# Patient Record
Sex: Male | Born: 1960 | Hispanic: No | Marital: Single | Smoking: Former smoker
Health system: Southern US, Community
[De-identification: ages and names within clinical notes are randomized; demographics above are authoritative.]

## PROBLEM LIST (undated history)

## (undated) DIAGNOSIS — Z531 Procedure and treatment not carried out because of patient's decision for reasons of belief and group pressure: Secondary | ICD-10-CM

## (undated) DIAGNOSIS — M1990 Unspecified osteoarthritis, unspecified site: Secondary | ICD-10-CM

## (undated) DIAGNOSIS — K5790 Diverticulosis of intestine, part unspecified, without perforation or abscess without bleeding: Secondary | ICD-10-CM

## (undated) DIAGNOSIS — K635 Polyp of colon: Secondary | ICD-10-CM

## (undated) DIAGNOSIS — IMO0001 Reserved for inherently not codable concepts without codable children: Secondary | ICD-10-CM

## (undated) DIAGNOSIS — R7303 Prediabetes: Secondary | ICD-10-CM

## (undated) HISTORY — DX: Unspecified osteoarthritis, unspecified site: M19.90

## (undated) HISTORY — PX: KNEE SURGERY: SHX244

## (undated) HISTORY — DX: Polyp of colon: K63.5

## (undated) HISTORY — PX: HERNIA REPAIR: SHX51

## (undated) HISTORY — PX: TOOTH EXTRACTION: SUR596

## (undated) HISTORY — PX: COLONOSCOPY: SHX174

## (undated) HISTORY — DX: Procedure and treatment not carried out because of patient's decision for reasons of belief and group pressure: Z53.1

## (undated) HISTORY — DX: Diverticulosis of intestine, part unspecified, without perforation or abscess without bleeding: K57.90

## (undated) HISTORY — DX: Reserved for inherently not codable concepts without codable children: IMO0001

---

## 1997-12-24 ENCOUNTER — Emergency Department (HOSPITAL_COMMUNITY): Admission: EM | Admit: 1997-12-24 | Discharge: 1997-12-24 | Payer: Self-pay | Admitting: Emergency Medicine

## 1997-12-26 ENCOUNTER — Emergency Department (HOSPITAL_COMMUNITY): Admission: EM | Admit: 1997-12-26 | Discharge: 1997-12-26 | Payer: Self-pay | Admitting: Internal Medicine

## 1997-12-30 ENCOUNTER — Encounter: Payer: Self-pay | Admitting: Emergency Medicine

## 1997-12-30 ENCOUNTER — Emergency Department (HOSPITAL_COMMUNITY): Admission: EM | Admit: 1997-12-30 | Discharge: 1997-12-30 | Payer: Self-pay | Admitting: Emergency Medicine

## 1998-01-06 ENCOUNTER — Emergency Department (HOSPITAL_COMMUNITY): Admission: EM | Admit: 1998-01-06 | Discharge: 1998-01-06 | Payer: Self-pay | Admitting: Family Medicine

## 1998-02-25 ENCOUNTER — Encounter (HOSPITAL_COMMUNITY): Admission: RE | Admit: 1998-02-25 | Discharge: 1998-05-26 | Payer: Self-pay | Admitting: Psychiatry

## 1998-02-26 ENCOUNTER — Ambulatory Visit (HOSPITAL_COMMUNITY): Admission: RE | Admit: 1998-02-26 | Discharge: 1998-02-26 | Payer: Self-pay | Admitting: Gastroenterology

## 1998-03-28 ENCOUNTER — Encounter: Payer: Self-pay | Admitting: Emergency Medicine

## 1998-03-28 ENCOUNTER — Emergency Department (HOSPITAL_COMMUNITY): Admission: EM | Admit: 1998-03-28 | Discharge: 1998-03-28 | Payer: Self-pay

## 1998-06-27 ENCOUNTER — Ambulatory Visit (HOSPITAL_COMMUNITY): Admission: RE | Admit: 1998-06-27 | Discharge: 1998-06-27 | Payer: Self-pay | Admitting: Gastroenterology

## 1998-11-22 ENCOUNTER — Other Ambulatory Visit (HOSPITAL_COMMUNITY): Admission: RE | Admit: 1998-11-22 | Discharge: 1998-12-13 | Payer: Self-pay | Admitting: Psychiatry

## 1998-12-02 ENCOUNTER — Emergency Department (HOSPITAL_COMMUNITY): Admission: EM | Admit: 1998-12-02 | Discharge: 1998-12-02 | Payer: Self-pay | Admitting: Emergency Medicine

## 1998-12-25 ENCOUNTER — Encounter: Admission: RE | Admit: 1998-12-25 | Discharge: 1999-02-27 | Payer: Self-pay

## 2000-05-17 ENCOUNTER — Other Ambulatory Visit (HOSPITAL_COMMUNITY): Admission: RE | Admit: 2000-05-17 | Discharge: 2000-05-18 | Payer: Self-pay

## 2000-05-18 ENCOUNTER — Emergency Department (HOSPITAL_COMMUNITY): Admission: EM | Admit: 2000-05-18 | Discharge: 2000-05-18 | Payer: Self-pay | Admitting: Emergency Medicine

## 2000-05-18 ENCOUNTER — Encounter: Payer: Self-pay | Admitting: Emergency Medicine

## 2000-10-11 ENCOUNTER — Emergency Department (HOSPITAL_COMMUNITY): Admission: EM | Admit: 2000-10-11 | Discharge: 2000-10-11 | Payer: Self-pay | Admitting: Emergency Medicine

## 2000-10-11 ENCOUNTER — Encounter: Payer: Self-pay | Admitting: Emergency Medicine

## 2000-10-24 ENCOUNTER — Emergency Department (HOSPITAL_COMMUNITY): Admission: EM | Admit: 2000-10-24 | Discharge: 2000-10-24 | Payer: Self-pay | Admitting: Emergency Medicine

## 2000-12-03 ENCOUNTER — Other Ambulatory Visit (HOSPITAL_COMMUNITY): Admission: RE | Admit: 2000-12-03 | Discharge: 2000-12-20 | Payer: Self-pay | Admitting: Psychiatry

## 2008-07-02 DIAGNOSIS — J309 Allergic rhinitis, unspecified: Secondary | ICD-10-CM | POA: Insufficient documentation

## 2012-06-06 DIAGNOSIS — R911 Solitary pulmonary nodule: Secondary | ICD-10-CM | POA: Insufficient documentation

## 2012-06-07 DIAGNOSIS — E785 Hyperlipidemia, unspecified: Secondary | ICD-10-CM | POA: Insufficient documentation

## 2012-08-19 DIAGNOSIS — N529 Male erectile dysfunction, unspecified: Secondary | ICD-10-CM | POA: Insufficient documentation

## 2012-10-06 LAB — HM COLONOSCOPY

## 2014-01-14 HISTORY — PX: MENISCUS REPAIR: SHX5179

## 2017-01-26 ENCOUNTER — Telehealth: Payer: Self-pay | Admitting: Internal Medicine

## 2017-01-26 ENCOUNTER — Encounter: Payer: Self-pay | Admitting: Internal Medicine

## 2017-01-26 ENCOUNTER — Ambulatory Visit (INDEPENDENT_AMBULATORY_CARE_PROVIDER_SITE_OTHER): Payer: PRIVATE HEALTH INSURANCE | Admitting: Internal Medicine

## 2017-01-26 VITALS — BP 118/70 | HR 61 | Temp 98.1°F | Ht 70.5 in | Wt 181.2 lb

## 2017-01-26 DIAGNOSIS — N4889 Other specified disorders of penis: Secondary | ICD-10-CM

## 2017-01-26 DIAGNOSIS — M25562 Pain in left knee: Secondary | ICD-10-CM

## 2017-01-26 DIAGNOSIS — N529 Male erectile dysfunction, unspecified: Secondary | ICD-10-CM

## 2017-01-26 MED ORDER — TADALAFIL 20 MG PO TABS: 10.0000 mg | ORAL_TABLET | ORAL | 5 refills | Status: DC | PRN

## 2017-01-26 MED ORDER — NAPROXEN 375 MG PO TABS: 375.0000 mg | ORAL_TABLET | Freq: Two times a day (BID) | ORAL | 0 refills | Status: DC

## 2017-01-26 NOTE — Patient Instructions (Signed)

## 2017-01-26 NOTE — Progress Notes (Signed)
HPI  Pt presents to the clinic today to establish care. He is transferring care from his PCP in [place], [place], [facility].  He c/o left knee pain.This started 2 seeks ago. He describes the pain as sharp and stabbing. He reports his knee locked up on him where he couldn't bend it. He does have some tingling but denies numbness or weakness. He has not taken anything OTC for this. He had a torn meniscus repair in Feb 2016.  He also c/o a cyst at the base of his penis. He noticed this 2 weeks. He reports it does not hurt, itch or burn. He has not tried anything OTC for it.  He also reports difficulty maintaining an erection. This has been going on for years. He has no issues ejaculating. He has tried Cialis in the past and would like a RX for this today.   Flu: never Tetanus: 2-3 years ago PSA Screening: 2 years ago Colon Screening: 2013, every 3 years, polyps Vision Screening: as needed Dentist: as needed  History reviewed. No pertinent past medical history.  No current outpatient medications on file.   No current facility-administered medications for this visit.     No Known Allergies  Family History  Problem Relation Age of Onset  . Heart disease Father   . Heart attack Father   . Hypertension Sister   . Hyperlipidemia Sister   . Asthma Sister   . Hypertension Brother   . Hyperlipidemia Brother   . Hypertension Maternal Aunt   . Hypertension Maternal Uncle   . Hypertension Paternal Aunt   . Hypertension Paternal Uncle   . Hypertension Maternal Grandmother     Social History   Socioeconomic History  . Marital status: Single    Spouse name: Not on file  . Number of children: Not on file  . Years of education: Not on file  . Highest education level: Not on file  Social Needs  . Financial resource strain: Not on file  . Food insecurity - worry: Not on file  . Food insecurity - inability: Not on file  . Transportation needs - medical: Not on file  . Transportation needs -  non-medical: Not on file  Occupational History  . Not on file  Tobacco Use  . Smoking status: Former Research scientist (life sciences)  . Smokeless tobacco: Never Used  Substance and Sexual Activity  . Alcohol use: Yes  . Drug use: No  . Sexual activity: Yes    Birth control/protection: Condom  Other Topics Concern  . Not on file  Social History Narrative  . Not on file    ROS:  Constitutional: Denies fever, malaise, fatigue, headache or abrupt weight changes.  HEENT: Denies eye pain, eye redness, ear pain, ringing in the ears, wax buildup, runny nose, nasal congestion, bloody nose, or sore throat. Respiratory: Denies difficulty breathing, shortness of breath, cough or sputum production.   Cardiovascular: Denies chest pain, chest tightness, palpitations or swelling in the hands or feet.  Gastrointestinal: Denies abdominal pain, bloating, constipation, diarrhea or blood in the stool.  GU: Pt reports erectile dysfunction. Denies frequency, urgency, pain with urination, blood in urine, odor or discharge. Musculoskeletal: Pt reports knee pain. Denies decrease in range of motion, difficulty with gait, muscle pain or joint swelling.  Skin: Pt reports lesion on base of penis. Denies ulcercations.  Neurological: Denies dizziness, difficulty with memory, difficulty with speech or problems with balance and coordination.  Psych: Denies anxiety, depression, SI/HI.  No other specific complaints in  a complete review of systems (except as listed in HPI above).  PE:  BP 118/70   Pulse 61   Temp 98.1 F (36.7 C) (Oral)   Ht 5' 10.5" (1.791 m)   Wt 181 lb 4 oz (82.2 kg)   SpO2 97%   BMI 25.64 kg/m  Wt Readings from Last 3 Encounters:  01/26/17 181 lb 4 oz (82.2 kg)    General: Appears his stated age, well developed, well nourished in NAD. Cardiovascular: Normal rate and rhythm. S1,S2 noted.  No murmur, rubs or gallops noted. Pulmonary/Chest: Normal effort and positive vesicular breath sounds. No respiratory  distress. No wheezes, rales or ronchi noted.  Abdomen: Soft and nontender. Normal bowel sounds GU: 1 cm sebaceous cyst noted at base of penis. MSK: Normal flexion and extension of the knee. No joint swelling noted. Pain with palpation over the medial meniscus. No difficulty with gait. Neurological: Alert and oriented. Cranial nerves II-XII grossly intact. Coordination normal.  Psychiatric: Mood and affect normal. Behavior is normal. Judgment and thought content normal.    Assessment and Plan:  Sebaceous Cyst of Penis:  No intervention needed  Left Knee Pain:  eRx for Naproxen 375 mg BID Knee exercises given He declines PT at this time I do not think an xray is warranted at this time May need MRI in the future  ED:  eRx for Cialis 10-20 mg daily prn  Return precautions discussed. Make an appt for your annual exam. Webb Silversmith, NP

## 2017-01-27 ENCOUNTER — Telehealth: Payer: Self-pay

## 2017-01-27 NOTE — Telephone Encounter (Signed)
Pt reports this medication is too expensive... I did give pt coupon info on Viagra and he is wanting to see if this is more affordable.. The coupon says for Brand name.... Please advise

## 2017-01-27 NOTE — Telephone Encounter (Signed)
Copied from [place] #[id number]. Topic: Inquiry >> Jan 27, 2017  2:48 PM Marcus Andrews wrote: Reason for CRM: Patient called wanting to switch medications. Patient now wants the generic brand for Cialis. Please call patient ASAP.Marcus KitchenMarland Andrews

## 2017-01-28 MED ORDER — SILDENAFIL CITRATE 50 MG PO TABS: 25.0000 mg | ORAL_TABLET | Freq: Every day | ORAL | 0 refills | Status: DC | PRN

## 2017-01-28 NOTE — Addendum Note (Signed)
Addended by: Jearld Fenton on: 01/28/2017 11:26 AM   Modules accepted: Orders

## 2017-01-28 NOTE — Telephone Encounter (Signed)
Brand name Viagra sent to [facility]

## 2017-02-15 ENCOUNTER — Encounter: Payer: Self-pay | Admitting: Internal Medicine

## 2017-02-15 ENCOUNTER — Ambulatory Visit (INDEPENDENT_AMBULATORY_CARE_PROVIDER_SITE_OTHER): Payer: PRIVATE HEALTH INSURANCE | Admitting: Internal Medicine

## 2017-02-15 VITALS — BP 118/74 | HR 64 | Temp 98.7°F | Ht 70.5 in | Wt 184.5 lb

## 2017-02-15 DIAGNOSIS — H6123 Impacted cerumen, bilateral: Secondary | ICD-10-CM | POA: Diagnosis not present

## 2017-02-15 DIAGNOSIS — Z1159 Encounter for screening for other viral diseases: Secondary | ICD-10-CM

## 2017-02-15 DIAGNOSIS — Z13220 Encounter for screening for lipoid disorders: Secondary | ICD-10-CM | POA: Diagnosis not present

## 2017-02-15 DIAGNOSIS — Z1211 Encounter for screening for malignant neoplasm of colon: Secondary | ICD-10-CM

## 2017-02-15 DIAGNOSIS — R232 Flushing: Secondary | ICD-10-CM | POA: Diagnosis not present

## 2017-02-15 DIAGNOSIS — M25562 Pain in left knee: Secondary | ICD-10-CM

## 2017-02-15 DIAGNOSIS — N529 Male erectile dysfunction, unspecified: Secondary | ICD-10-CM

## 2017-02-15 DIAGNOSIS — Z125 Encounter for screening for malignant neoplasm of prostate: Secondary | ICD-10-CM | POA: Diagnosis not present

## 2017-02-15 DIAGNOSIS — Z114 Encounter for screening for human immunodeficiency virus [HIV]: Secondary | ICD-10-CM | POA: Diagnosis not present

## 2017-02-15 DIAGNOSIS — Z0000 Encounter for general adult medical examination without abnormal findings: Secondary | ICD-10-CM

## 2017-02-15 DIAGNOSIS — Z113 Encounter for screening for infections with a predominantly sexual mode of transmission: Secondary | ICD-10-CM | POA: Diagnosis not present

## 2017-02-15 LAB — CBC
HCT: 43.1 % (ref 39.0–52.0)
Hemoglobin: 13.9 g/dL (ref 13.0–17.0)
MCHC: 32.1 g/dL (ref 30.0–36.0)
MCV: 86.2 fl (ref 78.0–100.0)
Platelets: 227 10*3/uL (ref 150.0–400.0)
RBC: 5.01 Mil/uL (ref 4.22–5.81)
RDW: 15.7 % — ABNORMAL HIGH (ref 11.5–15.5)
WBC: 9.5 10*3/uL (ref 4.0–10.5)

## 2017-02-15 LAB — PSA: PSA: 0.86 ng/mL (ref 0.10–4.00)

## 2017-02-15 LAB — TSH: TSH: 0.9 u[IU]/mL (ref 0.35–4.50)

## 2017-02-15 MED ORDER — SILDENAFIL CITRATE 20 MG PO TABS: ORAL_TABLET | ORAL | 0 refills | Status: DC

## 2017-02-15 NOTE — Patient Instructions (Signed)

## 2017-02-15 NOTE — Progress Notes (Signed)
Subjective:    Patient ID: Marcus Andrews, male    DOB: [birth year], 56 y.o.   MRN: [id number]  HPI  Pt presents to the clinic today for his annual exam.  He reports the generic Sildenafil was too expensive at [facility]. He would like to have Revatio send to [facility].  He also reports no improvement in his knee pain. He has not started taking the Naproxen as prescribed. He has been doing knee exercises at home and using ice. He would like an xray of the left knee today.  He also reports he has been having hot flashes. This has been occurring on a daily basis for at least 2 years. He is not sure what triggers them. They can occur day or night. He has not tried anything OTC for his symptoms.   Flu: never Tetanus: 2-3 years ago PSA Screening: 2 years ago Colon Screening: 2013, every 3 years, polyps Vision Screening: as needed Dentist: as needed  Diet: He does eat meat. He consumes some fruits, more veggies. He does eat fried foods. He drinks mostly water, some soda, some coffee. Exercise: None  Review of Systems  No past medical history on file.  Current Outpatient Medications  Medication Sig Dispense Refill  . naproxen (NAPROSYN) 375 MG tablet Take 1 tablet (375 mg total) 2 (two) times daily with a meal by mouth. 60 tablet 0  . sildenafil (VIAGRA) 50 MG tablet Take 0.5 tablets (25 mg total) daily as needed by mouth for erectile dysfunction. 10 tablet 0   No current facility-administered medications for this visit.     No Known Allergies  Family History  Problem Relation Age of Onset  . Heart disease Father   . Heart attack Father   . Hypertension Sister   . Hyperlipidemia Sister   . Asthma Sister   . Hypertension Brother   . Hyperlipidemia Brother   . Hypertension Maternal Aunt   . Hypertension Maternal Uncle   . Hypertension Paternal Aunt   . Hypertension Paternal Uncle   . Hypertension Maternal Grandmother     Social History   Socioeconomic History  . Marital  status: Single    Spouse name: Not on file  . Number of children: Not on file  . Years of education: Not on file  . Highest education level: Not on file  Social Needs  . Financial resource strain: Not on file  . Food insecurity - worry: Not on file  . Food insecurity - inability: Not on file  . Transportation needs - medical: Not on file  . Transportation needs - non-medical: Not on file  Occupational History  . Not on file  Tobacco Use  . Smoking status: Former Research scientist (life sciences)  . Smokeless tobacco: Never Used  Substance and Sexual Activity  . Alcohol use: Yes  . Drug use: No  . Sexual activity: Yes    Birth control/protection: Condom  Other Topics Concern  . Not on file  Social History Narrative  . Not on file     Constitutional: Denies fever, malaise, fatigue, headache or abrupt weight changes.  HEENT: Denies eye pain, eye redness, ear pain, ringing in the ears, wax buildup, runny nose, nasal congestion, bloody nose, or sore throat. Respiratory: Denies difficulty breathing, shortness of breath, cough or sputum production.   Cardiovascular: Denies chest pain, chest tightness, palpitations or swelling in the hands or feet.  Gastrointestinal: Denies abdominal pain, bloating, constipation, diarrhea or blood in the stool.  GU: Pt reports erectile dysfunction.  Denies urgency, frequency, pain with urination, burning sensation, blood in urine, odor or discharge. Musculoskeletal: Pt reports left knee pain. Denies decrease in range of motion, difficulty with gait, muscle pain or joint swelling.  Skin: Denies redness, rashes, lesions or ulcercations.  Neurological: Denies dizziness, difficulty with memory, difficulty with speech or problems with balance and coordination.  Psych: Denies anxiety, depression, SI/HI.  No other specific complaints in a complete review of systems (except as listed in HPI above).     Objective:   Physical Exam   BP 118/74   Pulse 64   Temp 98.7 F (37.1 C)  (Oral)   Ht 5' 10.5" (1.791 m)   Wt 184 lb 8 oz (83.7 kg)   SpO2 97%   BMI 26.10 kg/m  Wt Readings from Last 3 Encounters:  02/15/17 184 lb 8 oz (83.7 kg)  01/26/17 181 lb 4 oz (82.2 kg)    General: Appears his stated age, well developed, well nourished in NAD. Skin: Warm, dry and intact.  HEENT: Head: normal shape and size; Eyes: sclera white, no icterus, conjunctiva pink, PERRLA and EOMs intact; Ears: bilateral cerumen impaction; Throat/Mouth: Teeth present, mucosa pink and moist, no exudate, lesions or ulcerations noted.  Neck:  Neck supple, trachea midline. No masses, lumps or thyromegaly present.  Cardiovascular: Normal rate and rhythm. S1,S2 noted.  No murmur, rubs or gallops noted. No JVD or BLE edema. No carotid bruits noted. Pulmonary/Chest: Normal effort and positive vesicular breath sounds. No respiratory distress. No wheezes, rales or ronchi noted.  Abdomen: Soft and nontender. Normal bowel sounds. No distention or masses noted. Liver, spleen and kidneys non palpable. Musculoskeletal: Normal flexion and extension of the left knee. No swelling noted. Pain with palpation over the medial meniscus. Strength 5/5 BUE/BLE. No difficulty with gait.  Neurological: Alert and oriented. Cranial nerves II-XII grossly intact. Coordination normal.  Psychiatric: Mood and affect normal. Behavior is normal. Judgment and thought content normal.           Assessment & Plan:   Preventative Health Maintenance:  He declines flu shot Tetanus UTD per his report Referral placed to GI for screening colonoscopy Encouraged him to consume a balanced diet and exercise regimen Advised him to see an eye doctor and dentist annually Will check CBC, CMET, Lipid, TSH, PSA, HIV, Hep C, HSV and RPR today He will return for urine gonorrhea and chlamydia  Left Knee Pain:  Continue exercises and ice Xray of left knee today May need PT vs MRI  Bilateral Cerumen Impaction:  Manual lavage by  CMA Advised him to try Debrox OTC 2 x week to prevent wax buildup  ED:  Will send Revatio to [facility] Flashes:  Will check TSH today  RTC in 1 year, sooner if needed

## 2017-02-15 NOTE — Addendum Note (Signed)
Addended by: Ellamae Sia on: 02/15/2017 01:03 PM   Modules accepted: Orders

## 2017-02-16 LAB — COMPREHENSIVE METABOLIC PANEL
ALT: 14 U/L (ref 0–53)
AST: 17 U/L (ref 0–37)
Albumin: 4.2 g/dL (ref 3.5–5.2)
Alkaline Phosphatase: 69 U/L (ref 39–117)
BUN: 14 mg/dL (ref 6–23)
CO2: 28 mEq/L (ref 19–32)
Calcium: 9.5 mg/dL (ref 8.4–10.5)
Chloride: 105 mEq/L (ref 96–112)
Creatinine, Ser: 0.99 mg/dL (ref 0.40–1.50)
GFR: 100.91 mL/min (ref >60.00)
Glucose, Bld: 82 mg/dL (ref 70–99)
Potassium: 4.2 mEq/L (ref 3.5–5.1)
Sodium: 142 mEq/L (ref 135–145)
Total Bilirubin: 0.6 mg/dL (ref 0.2–1.2)
Total Protein: 6.5 g/dL (ref 6.0–8.3)

## 2017-02-16 LAB — LIPID PANEL
Cholesterol: 215 mg/dL — ABNORMAL HIGH (ref 0–200)
HDL: 68.4 mg/dL (ref >39.00)
LDL Cholesterol: 133 mg/dL — ABNORMAL HIGH (ref 0–99)
NonHDL: 146.21
Total CHOL/HDL Ratio: 3
Triglycerides: 68 mg/dL (ref 0.0–149.0)
VLDL: 13.6 mg/dL (ref 0.0–40.0)

## 2017-02-17 LAB — RPR: RPR: NONREACTIVE

## 2017-02-17 LAB — HSV 1/2 AB (IGM), IFA W/RFLX TITER
HSV 1 IgM Screen: NEGATIVE
HSV 2 IgM Screen: NEGATIVE

## 2017-02-17 LAB — HSV(HERPES SIMPLEX VRS) I + II AB-IGG
HAV 1 IGG,TYPE SPECIFIC AB: 2.32 index — ABNORMAL HIGH
HSV 2 IGG,TYPE SPECIFIC AB: 15.6 index — ABNORMAL HIGH

## 2017-02-17 LAB — HEPATITIS C ANTIBODY
HEP C AB: NONREACTIVE
SIGNAL TO CUT-OFF: 0.02 (ref <1.00)

## 2017-02-17 LAB — HIV ANTIBODY (ROUTINE TESTING W REFLEX): HIV: NONREACTIVE

## 2017-02-19 ENCOUNTER — Ambulatory Visit (INDEPENDENT_AMBULATORY_CARE_PROVIDER_SITE_OTHER)
Admission: RE | Admit: 2017-02-19 | Discharge: 2017-02-19 | Disposition: A | Discharge status: Home / Self Care | Payer: PRIVATE HEALTH INSURANCE | Source: Ambulatory Visit | Attending: Internal Medicine | Admitting: Internal Medicine

## 2017-02-19 ENCOUNTER — Other Ambulatory Visit: Payer: PRIVATE HEALTH INSURANCE

## 2017-02-19 DIAGNOSIS — M25562 Pain in left knee: Secondary | ICD-10-CM | POA: Diagnosis not present

## 2017-02-19 NOTE — Addendum Note (Signed)
Addended by: Ellamae Sia on: 02/19/2017 02:56 PM   Modules accepted: Orders

## 2017-02-19 NOTE — Addendum Note (Signed)
Addended by: Lurlean Nanny on: 02/19/2017 02:25 PM   Modules accepted: Orders

## 2017-02-20 LAB — C. TRACHOMATIS/N. GONORRHOEAE RNA
C. trachomatis RNA, TMA: NOT DETECTED
N. gonorrhoeae RNA, TMA: NOT DETECTED

## 2017-03-02 ENCOUNTER — Other Ambulatory Visit: Payer: Self-pay | Admitting: Internal Medicine

## 2017-03-02 DIAGNOSIS — M25562 Pain in left knee: Secondary | ICD-10-CM

## 2017-03-05 ENCOUNTER — Telehealth: Payer: Self-pay | Admitting: Internal Medicine

## 2017-03-05 NOTE — Telephone Encounter (Signed)
rx request [facility] pt in [facility] box

## 2017-03-08 ENCOUNTER — Other Ambulatory Visit: Payer: Self-pay | Admitting: Internal Medicine

## 2017-03-08 NOTE — Telephone Encounter (Signed)
I can not find this form

## 2017-03-24 ENCOUNTER — Ambulatory Visit: Payer: PRIVATE HEALTH INSURANCE

## 2017-06-08 ENCOUNTER — Telehealth: Payer: Self-pay | Admitting: Gastroenterology

## 2017-06-08 ENCOUNTER — Encounter: Payer: Self-pay | Admitting: Internal Medicine

## 2017-06-08 NOTE — Telephone Encounter (Signed)
Dr. Fuller Plan reviewed records and recommended "5 year recall colon due to prior hx of polyps and Fhx colon cancer. Pt last Andrena Mews was in July 2014, due July 2019."

## 2017-08-04 ENCOUNTER — Encounter: Payer: Self-pay | Admitting: Internal Medicine

## 2017-09-28 NOTE — Telephone Encounter (Signed)
Patient has not returned phone call to schedule. Records will be in "records reviewed" folder. °

## 2017-11-26 ENCOUNTER — Other Ambulatory Visit: Payer: Self-pay | Admitting: Internal Medicine

## 2017-11-26 DIAGNOSIS — N529 Male erectile dysfunction, unspecified: Secondary | ICD-10-CM

## 2017-11-26 NOTE — Telephone Encounter (Signed)
Name of Medication: sildenafil (Revatio) 20 mg Name of Pharmacy: [facility] Date and Quantity: #50 on 02/15/17 Last Office Visit and Type: 02/15/17 annual Next Office Visit and Type: none  per note pt wants to pick up rx.

## 2017-11-30 MED ORDER — SILDENAFIL CITRATE 20 MG PO TABS: ORAL_TABLET | ORAL | 0 refills | Status: DC

## 2017-11-30 NOTE — Telephone Encounter (Signed)
RX printed and signed and placed in [facility] box 

## 2018-06-30 ENCOUNTER — Emergency Department
Admission: EM | Admit: 2018-06-30 | Discharge: 2018-06-30 | Disposition: A | Discharge status: Home / Self Care | Payer: BLUE CROSS/BLUE SHIELD | Attending: Emergency Medicine | Admitting: Emergency Medicine

## 2018-06-30 ENCOUNTER — Emergency Department: Payer: BLUE CROSS/BLUE SHIELD

## 2018-06-30 ENCOUNTER — Encounter: Payer: Self-pay | Admitting: Emergency Medicine

## 2018-06-30 ENCOUNTER — Other Ambulatory Visit: Payer: Self-pay

## 2018-06-30 DIAGNOSIS — Z79899 Other long term (current) drug therapy: Secondary | ICD-10-CM | POA: Insufficient documentation

## 2018-06-30 DIAGNOSIS — J069 Acute upper respiratory infection, unspecified: Secondary | ICD-10-CM | POA: Diagnosis not present

## 2018-06-30 DIAGNOSIS — Z87891 Personal history of nicotine dependence: Secondary | ICD-10-CM | POA: Insufficient documentation

## 2018-06-30 DIAGNOSIS — R0602 Shortness of breath: Secondary | ICD-10-CM | POA: Diagnosis present

## 2018-06-30 DIAGNOSIS — Z20828 Contact with and (suspected) exposure to other viral communicable diseases: Secondary | ICD-10-CM | POA: Diagnosis not present

## 2018-06-30 DIAGNOSIS — Z20822 Contact with and (suspected) exposure to covid-19: Secondary | ICD-10-CM

## 2018-06-30 DIAGNOSIS — R6889 Other general symptoms and signs: Secondary | ICD-10-CM

## 2018-06-30 LAB — CBC
HCT: 45.5 % (ref 39.0–52.0)
Hemoglobin: 14.4 g/dL (ref 13.0–17.0)
MCH: 27.3 pg (ref 26.0–34.0)
MCHC: 31.6 g/dL (ref 30.0–36.0)
MCV: 86.2 fL (ref 80.0–100.0)
Platelets: 236 10*3/uL (ref 150–400)
RBC: 5.28 MIL/uL (ref 4.22–5.81)
RDW: 15.9 % — ABNORMAL HIGH (ref 11.5–15.5)
WBC: 7.1 10*3/uL (ref 4.0–10.5)
nRBC: 0 % (ref 0.0–0.2)

## 2018-06-30 LAB — COMPREHENSIVE METABOLIC PANEL
ALT: 21 U/L (ref 0–44)
AST: 23 U/L (ref 15–41)
Albumin: 4.1 g/dL (ref 3.5–5.0)
Alkaline Phosphatase: 74 U/L (ref 38–126)
Anion gap: 7 (ref 5–15)
BUN: 14 mg/dL (ref 6–20)
CO2: 28 mmol/L (ref 22–32)
Calcium: 9.3 mg/dL (ref 8.9–10.3)
Chloride: 107 mmol/L (ref 98–111)
Creatinine, Ser: 1 mg/dL (ref 0.61–1.24)
GFR calc Af Amer: >60 mL/min (ref >60)
GFR calc non Af Amer: >60 mL/min (ref >60)
Glucose, Bld: 114 mg/dL — ABNORMAL HIGH (ref 70–99)
Potassium: 3.6 mmol/L (ref 3.5–5.1)
Sodium: 142 mmol/L (ref 135–145)
Total Bilirubin: 0.9 mg/dL (ref 0.3–1.2)
Total Protein: 7.9 g/dL (ref 6.5–8.1)

## 2018-06-30 LAB — TROPONIN I: Troponin I: <0.03 ng/mL (ref <0.03)

## 2018-06-30 NOTE — Discharge Instructions (Signed)
Person Under Monitoring Name: Marcus Andrews  Location: [address] [place] [address] [place] [address]   Infection Prevention Recommendations for Individuals Confirmed to have, or Being Evaluated for, 2019 Novel Coronavirus (COVID-19) Infection Who Receive Care at Home  Individuals who are confirmed to have, or are being evaluated for, COVID-19 should follow the prevention steps below until a healthcare provider or local or state health department says they can return to normal activities.  Stay home except to get medical care You should restrict activities outside your home, except for getting medical care. Do not go to work, school, or public areas, and do not use public transportation or taxis.  Call ahead before visiting your doctor Before your medical appointment, call the healthcare provider and tell them that you have, or are being evaluated for, COVID-19 infection. This will help the healthcare providers office take steps to keep other people from getting infected. Ask your healthcare provider to call the local or state health department.  Monitor your symptoms Seek prompt medical attention if your illness is worsening (e.g., difficulty breathing). Before going to your medical appointment, call the healthcare provider and tell them that you have, or are being evaluated for, COVID-19 infection. Ask your healthcare provider to call the local or state health department.  Wear a facemask You should wear a facemask that covers your nose and mouth when you are in the same room with other people and when you visit a healthcare provider. People who live with or visit you should also wear a facemask while they are in the same room with you.  Separate yourself from other people in your home As much as possible, you should stay in a different room from other people in your home. Also, you should use a separate bathroom, if available.  Avoid sharing household items You  should not share dishes, drinking glasses, cups, eating utensils, towels, bedding, or other items with other people in your home. After using these items, you should wash them thoroughly with soap and water.  Cover your coughs and sneezes Cover your mouth and nose with a tissue when you cough or sneeze, or you can cough or sneeze into your sleeve. Throw used tissues in a lined trash can, and immediately wash your hands with soap and water for at least 20 seconds or use an alcohol-based hand rub.  Wash your [facility] your hands often and thoroughly with soap and water for at least 20 seconds. You can use an alcohol-based hand sanitizer if soap and water are not available and if your hands are not visibly dirty. Avoid touching your eyes, nose, and mouth with unwashed hands.   Prevention Steps for Caregivers and Household Members of Individuals Confirmed to have, or Being Evaluated for, COVID-19 Infection Being Cared for in the Home  If you live with, or provide care at home for, a person confirmed to have, or being evaluated for, COVID-19 infection please follow these guidelines to prevent infection:  Follow healthcare providers instructions Make sure that you understand and can help the patient follow any healthcare provider instructions for all care.  Provide for the patients basic needs You should help the patient with basic needs in the home and provide support for getting groceries, prescriptions, and other personal needs.  Monitor the patients symptoms If they are getting sicker, call his or her medical provider and tell them that the patient has, or is being evaluated for, COVID-19 infection. This will help the  healthcare providers office take steps to keep other people from getting infected. Ask the healthcare provider to call the local or state health department.  Limit the number of people who have contact with the patient If possible, have only one caregiver for the  patient. Other household members should stay in another home or place of residence. If this is not possible, they should stay in another room, or be separated from the patient as much as possible. Use a separate bathroom, if available. Restrict visitors who do not have an essential need to be in the home.  Keep older adults, very young children, and other sick people away from the patient Keep older adults, very young children, and those who have compromised immune systems or chronic health conditions away from the patient. This includes people with chronic heart, lung, or kidney conditions, diabetes, and cancer.  Ensure good ventilation Make sure that shared spaces in the home have good air flow, such as from an air conditioner or an opened window, weather permitting.  Wash your hands often Wash your hands often and thoroughly with soap and water for at least 20 seconds. You can use an alcohol based hand sanitizer if soap and water are not available and if your hands are not visibly dirty. Avoid touching your eyes, nose, and mouth with unwashed hands. Use disposable paper towels to dry your hands. If not available, use dedicated cloth towels and replace them when they become wet.  Wear a facemask and gloves Wear a disposable facemask at all times in the room and gloves when you touch or have contact with the patients blood, body fluids, and/or secretions or excretions, such as sweat, saliva, sputum, nasal mucus, vomit, urine, or feces.  Ensure the mask fits over your nose and mouth tightly, and do not touch it during use. Throw out disposable facemasks and gloves after using them. Do not reuse. Wash your hands immediately after removing your facemask and gloves. If your personal clothing becomes contaminated, carefully remove clothing and launder. Wash your hands after handling contaminated clothing. Place all used disposable facemasks, gloves, and other waste in a lined container before  disposing them with other household waste. Remove gloves and wash your hands immediately after handling these items.  Do not share dishes, glasses, or other household items with the patient Avoid sharing household items. You should not share dishes, drinking glasses, cups, eating utensils, towels, bedding, or other items with a patient who is confirmed to have, or being evaluated for, COVID-19 infection. After the person uses these items, you should wash them thoroughly with soap and water.  Wash laundry thoroughly Immediately remove and wash clothes or bedding that have blood, body fluids, and/or secretions or excretions, such as sweat, saliva, sputum, nasal mucus, vomit, urine, or feces, on them. Wear gloves when handling laundry from the patient. Read and follow directions on labels of laundry or clothing items and detergent. In general, wash and dry with the warmest temperatures recommended on the label.  Clean all areas the individual has used often Clean all touchable surfaces, such as counters, tabletops, doorknobs, bathroom fixtures, toilets, phones, keyboards, tablets, and bedside tables, every day. Also, clean any surfaces that may have blood, body fluids, and/or secretions or excretions on them. Wear gloves when cleaning surfaces the patient has come in contact with. Use a diluted bleach solution (e.g., dilute bleach with 1 part bleach and 10 parts water) or a household disinfectant with a label that says EPA-registered for coronaviruses. To  make a bleach solution at home, add 1 tablespoon of bleach to 1 quart (4 cups) of water. For a larger supply, add  cup of bleach to 1 gallon (16 cups) of water. Read labels of cleaning products and follow recommendations provided on product labels. Labels contain instructions for safe and effective use of the cleaning product including precautions you should take when applying the product, such as wearing gloves or eye protection and making sure you  have good ventilation during use of the product. Remove gloves and wash hands immediately after cleaning.  Monitor yourself for signs and symptoms of illness Caregivers and household members are considered close contacts, should monitor their health, and will be asked to limit movement outside of the home to the extent possible. Follow the monitoring steps for close contacts listed on the symptom monitoring form.   ? If you have additional questions, contact your local health department or call the epidemiologist on call at [phone] (available 24/7). ? This guidance is subject to change. For the most up-to-date guidance from [facility], please refer to their website: [email or web address]

## 2018-06-30 NOTE — ED Provider Notes (Signed)
[facility] Emergency Department Provider Note   ____________________________________________   First MD Initiated Contact with Patient 06/30/18 [phone]     (approximate)  I have reviewed the triage vital signs and the nursing notes.   HISTORY  Chief Complaint Cough and Shortness of Breath    HPI Marcus Andrews is a 58 y.o. male here for evaluation after developing cough and shortness of breath last night  Patient reports that yesterday began to feel little bit chilled a little achy in his body.  Instead experienced occasional loose watery nonbloody stool.  No abdominal pain however.  Reports he has had loose stools a couple of times now since yesterday.  He also started to feel bit of a mild headache, achy throughout his body.  He started coughing last night, mostly dry occasionally just slightly productive.  He felt fairly short of breath during coughing episodes this evening and last night, but he starting to feel better now that he is up and going.  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  He goes up and on the [place] and has been to [place] recently as well.  Reports travel history and reports of potentially lots of exposure to coronavirus  He denies any pain or burning with urination.  No chest pain.  No nausea or vomiting.  Still eating and drinking well.  He has not noticed any wheezing.   History reviewed. No pertinent past medical history.  There are no active problems to display for this patient.   Past Surgical History:  Procedure Laterality Date   KNEE SURGERY     TOOTH EXTRACTION      Prior to Admission medications   Medication Sig Start Date End Date Taking? Authorizing Provider  naproxen (NAPROSYN) 375 MG tablet Take 1 tablet (375 mg total) 2 (two) times daily with a meal by mouth. 01/26/17   Jearld Fenton, NP  sildenafil (REVATIO) 20 MG tablet Take 2-5 tabs as needed for sexual intercourse 11/30/17   Jearld Fenton,  NP  sildenafil (VIAGRA) 50 MG tablet Take 0.5 tablets (25 mg total) daily as needed by mouth for erectile dysfunction. 01/28/17   Jearld Fenton, NP    Allergies Patient has no known allergies.  Family History  Problem Relation Age of Onset   Heart disease Father    Heart attack Father    Hypertension Sister    Hyperlipidemia Sister    Asthma Sister    Hypertension Brother    Hyperlipidemia Brother    Hypertension Maternal Aunt    Hypertension Maternal Uncle    Hypertension Paternal Aunt    Hypertension Paternal Uncle    Hypertension Maternal Grandmother     Social History Social History   Tobacco Use   Smoking status: Former Smoker   Smokeless tobacco: Never Used  Substance Use Topics   Alcohol use: Yes   Drug use: No    Review of Systems Constitutional: No fever but started to feel chills Eyes: No visual changes. ENT: No sore throat. Cardiovascular: Denies chest pain. Respiratory: See HPI.  Not feeling short of breath now Gastrointestinal: No abdominal pain.  Some diarrhea. Genitourinary: Negative for dysuria. Musculoskeletal: Negative for back pain.  Feels achy in his muscles Skin: Negative for rash. Neurological: Negative for areas of focal weakness or numbness.  No neck pain.  Moderate to mild throbbing type headache that seems eating better today.    ____________________________________________   PHYSICAL EXAM:  VITAL SIGNS:  ED Triage Vitals  Enc Vitals Group     BP 06/30/18 0629 136/86     Pulse Rate 06/30/18 0629 60     Resp 06/30/18 0629 16     Temp 06/30/18 0629 98.1 F (36.7 C)     Temp Source 06/30/18 0629 Oral     SpO2 06/30/18 0629 98 %     Weight 06/30/18 0627 182 lb (82.6 kg)     Height 06/30/18 0627 6' (1.829 m)     Head Circumference --      Peak Flow --      Pain Score 06/30/18 0626 3     Pain Loc --      Pain Edu? --      Excl. in [place]? --     Constitutional: Alert and oriented. Well appearing and in no acute  distress.  He is ambulating back and forth to the bathroom without any distress when I went to see him.  He is very pleasant. Eyes: Conjunctivae are normal. Head: Atraumatic. Nose: No congestion/rhinnorhea. Mouth/Throat: Mucous membranes are moist. Neck: No stridor.  Cardiovascular: Normal rate, regular rhythm. Grossly normal heart sounds.  Good peripheral circulation. Respiratory: Normal respiratory effort.  No retractions. Lungs CTAB.  He exhibits a dry nonproductive cough when asked to take a deep breath.  His lungs are clear and he speaks in full clear sentences with normal oxygen levels. Gastrointestinal: Soft and nontender. No distention. Musculoskeletal: No lower extremity tenderness nor edema. Neurologic:  Normal speech and language. No gross focal neurologic deficits are appreciated.  Skin:  Skin is warm, dry and intact. No rash noted. Psychiatric: Mood and affect are normal. Speech and behavior are normal.  ____________________________________________   LABS (all labs ordered are listed, but only abnormal results are displayed)  Labs Reviewed  CBC - Abnormal; Notable for the following components:      Result Value   RDW 15.9 (*)    All other components within normal limits  COMPREHENSIVE METABOLIC PANEL - Abnormal; Notable for the following components:   Glucose, Bld 114 (*)    All other components within normal limits  TROPONIN I   ____________________________________________  EKG  Reviewed interpreted by me at 720 Heart rate 55 QRS 80 QTc 400 First-degree AV block, no evidence of acute ischemia ____________________________________________  RADIOLOGY    Chest x-ray reviewed, clear.  Personally viewed by me ____________________________________________   PROCEDURES  Procedure(s) performed: None  Procedures  Critical Care performed: No  ____________________________________________   INITIAL IMPRESSION / ASSESSMENT AND PLAN / ED COURSE  Pertinent  labs & imaging results that were available during my care of the patient were reviewed by me and considered in my medical decision making (see chart for details).   Marcus Andrews was evaluated in Emergency Department on 06/30/2018 for the symptoms described in the history of present illness. He was evaluated in the context of the global COVID-19 pandemic, which necessitated consideration that the patient might be at risk for infection with the SARS-CoV-2 virus that causes COVID-19. Institutional protocols and algorithms that pertain to the evaluation of patients at risk for COVID-19 are in a state of rapid change based on information released by regulatory bodies including the CDC and federal and state organizations. These policies and algorithms were followed during the patient's care in the ED.   Based on the patient's symptoms and his travel history I suspect he does have a mild case of coronavirus.  He does not meet algorithm criteria for  testing at this time, but his symptomatology does not demonstrate evidence of an acute bacterial infection.  His abdomen is soft nontender nondistended all quadrants.  He is well-appearing and nontoxic.  Based on his history, he is not presently febrile but does report chills, body aches, cough, and some loose stool.  At this time I suspect this represents coronavirus.  Return precautions and treatment recommendations and follow-up discussed with the patient who is agreeable with the plan.       ____________________________________________   FINAL CLINICAL IMPRESSION(S) / ED DIAGNOSES  Final diagnoses:  Suspected Covid-19 Virus Infection  Viral upper respiratory illness        Note:  This document was prepared using Dragon voice recognition software and may include unintentional dictation errors       Delman Kitten, MD 06/30/18 [phone]

## 2018-06-30 NOTE — ED Triage Notes (Signed)
Patient ambulatory to triage with steady gait, without difficulty or distress noted; pt reports prod cough clear sputum, SHOB since yesterday accomp by HA and diarrhea

## 2019-04-24 ENCOUNTER — Other Ambulatory Visit: Payer: Self-pay

## 2019-04-24 ENCOUNTER — Emergency Department: Payer: 59

## 2019-04-24 ENCOUNTER — Encounter: Payer: Self-pay | Admitting: Emergency Medicine

## 2019-04-24 ENCOUNTER — Emergency Department
Admission: EM | Admit: 2019-04-24 | Discharge: 2019-04-24 | Disposition: A | Discharge status: Home / Self Care | Payer: 59 | Attending: Emergency Medicine | Admitting: Emergency Medicine

## 2019-04-24 DIAGNOSIS — M25461 Effusion, right knee: Secondary | ICD-10-CM | POA: Insufficient documentation

## 2019-04-24 DIAGNOSIS — M25561 Pain in right knee: Secondary | ICD-10-CM | POA: Diagnosis present

## 2019-04-24 DIAGNOSIS — M79604 Pain in right leg: Secondary | ICD-10-CM | POA: Insufficient documentation

## 2019-04-24 DIAGNOSIS — Z87891 Personal history of nicotine dependence: Secondary | ICD-10-CM | POA: Diagnosis not present

## 2019-04-24 MED ORDER — TRAMADOL HCL 50 MG PO TABS: 50.0000 mg | ORAL_TABLET | Freq: Four times a day (QID) | ORAL | 0 refills | Status: DC | PRN

## 2019-04-24 MED ORDER — NAPROXEN 500 MG PO TABS: 500.0000 mg | ORAL_TABLET | Freq: Two times a day (BID) | ORAL | 0 refills | Status: DC

## 2019-04-24 NOTE — ED Triage Notes (Signed)
C/O right knee swelling and bilateral knee pain x 5 days.  Unsure if pain is related to injury, but states he had been "in a stance' working and afterward, felt knee pain.  Pain is primarily anterior knee pain, but also states pain radiates down right calf.  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

## 2019-04-24 NOTE — ED Notes (Signed)
See triage note  Presents with bilateral knee pain  States pain is worse on the right  Denies any fall but states he felt a pop to knees  Pain is to anterior aspect of knees

## 2019-04-24 NOTE — ED Provider Notes (Signed)
[facility] Emergency Department Provider Note   ____________________________________________   First MD Initiated Contact with Patient 04/24/19 1213     (approximate)  I have reviewed the triage vital signs and the nursing notes.   HISTORY  Chief Complaint No chief complaint on file.   HPI Marcus Andrews is a 59 y.o. male presents to the ED with complaint of acute right knee pain.  Patient states that for the last 5 days his knee has swollen and he is been favoring it when he walks because of the pain.  Patient denies any injury but reports that he began having knee pain after he was using a hammer and was standing in 1 position during the time he was hammering.  He then felt pain to his knee but denies any direct trauma to his knee.  He denies any previous knee problems.  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  He denies any previous DVTs.  Currently rates his pain as an 8 out of 10.      History reviewed. No pertinent past medical history.  There are no problems to display for this patient.   Past Surgical History:  Procedure Laterality Date   KNEE SURGERY     TOOTH EXTRACTION      Prior to Admission medications   Medication Sig Start Date End Date Taking? Authorizing Provider  naproxen (NAPROSYN) 500 MG tablet Take 1 tablet (500 mg total) by mouth 2 (two) times daily with a meal. 04/24/19   Letitia Neri L, [name]  sildenafil (REVATIO) 20 MG tablet Take 2-5 tabs as needed for sexual intercourse 11/30/17   Jearld Fenton, NP  sildenafil (VIAGRA) 50 MG tablet Take 0.5 tablets (25 mg total) daily as needed by mouth for erectile dysfunction. 01/28/17   Jearld Fenton, NP  traMADol (ULTRAM) 50 MG tablet Take 1 tablet (50 mg total) by mouth every 6 (six) hours as needed. 04/24/19   Johnn Hai, [name]    Allergies Patient has no known allergies.  Family History  Problem Relation Age of Onset    Heart disease Father    Heart attack Father    Hypertension Sister    Hyperlipidemia Sister    Asthma Sister    Hypertension Brother    Hyperlipidemia Brother    Hypertension Maternal Aunt    Hypertension Maternal Uncle    Hypertension Paternal Aunt    Hypertension Paternal Uncle    Hypertension Maternal Grandmother     Social History Social History   Tobacco Use   Smoking status: Former Smoker   Smokeless tobacco: Never Used  Substance Use Topics   Alcohol use: Yes   Drug use: No    Review of Systems Constitutional: No fever/chills Cardiovascular: Denies chest pain. Respiratory: Denies shortness of breath. Gastrointestinal: No abdominal pain.  No nausea, no vomiting. Musculoskeletal: Positive bilateral knee pain with the right worse than the left. Skin: Negative for rash. Neurological: Negative for headaches, focal weakness or numbness. ___________________________________________   PHYSICAL EXAM:  VITAL SIGNS: ED Triage Vitals  Enc Vitals Group     BP 04/24/19 1114 (!) 142/84     Pulse Rate 04/24/19 1114 63     Resp 04/24/19 1114 16     Temp 04/24/19 1114 98.3 F (36.8 C)     Temp Source 04/24/19 1114 Oral     SpO2 04/24/19 1114 97 %     Weight 04/24/19 1111  182 lb 1.6 oz (82.6 kg)     Height 04/24/19 1111 6' (1.829 m)     Head Circumference --      Peak Flow --      Pain Score 04/24/19 1111 8     Pain Loc --      Pain Edu? --      Excl. in [place]? --     Constitutional: Alert and oriented. Well appearing and in no acute distress. Eyes: Conjunctivae are normal.  Head: Atraumatic. Neck: No stridor.   Cardiovascular: Normal rate, regular rhythm. Grossly normal heart sounds.  Good peripheral circulation. Respiratory: Normal respiratory effort.  No retractions. Lungs CTAB. Musculoskeletal: On examination of the right knee there is no gross deformity however there is moderate tenderness on the medial aspect of the patella.  There is minimal soft  tissue edema with small effusion noted in this area.  No warmth or erythema present.  Good muscle strength noted.  No calf tenderness is present.  Bevelyn Buckles' sign was negative.  Range of motion is restricted secondary to discomfort.  Patient does walk with a limp noted when walking in the hallway. Neurologic:  Normal speech and language. No gross focal neurologic deficits are appreciated. No gait instability. Skin:  Skin is warm, dry and intact.  No erythema, abrasions, ecchymosis noted. Psychiatric: Mood and affect are normal. Speech and behavior are normal.  ____________________________________________   LABS (all labs ordered are listed, but only abnormal results are displayed)  Labs Reviewed - No data to display  RADIOLOGY   Official radiology report(s): US Venous Img Lower Unilateral Right  Result Date: 04/24/2019 CLINICAL DATA:  59 year old male with right knee and calf pain for the past 5 days. EXAM: RIGHT LOWER EXTREMITY VENOUS DOPPLER ULTRASOUND TECHNIQUE: Gray-scale sonography with graded compression, as well as color Doppler and duplex ultrasound were performed to evaluate the lower extremity deep venous systems from the level of the common femoral vein and including the common femoral, femoral, profunda femoral, popliteal and calf veins including the posterior tibial, peroneal and gastrocnemius veins when visible. The superficial great saphenous vein was also interrogated. Spectral Doppler was utilized to evaluate flow at rest and with distal augmentation maneuvers in the common femoral, femoral and popliteal veins. COMPARISON:  None. FINDINGS: Contralateral Common Femoral Vein: Respiratory phasicity is normal and symmetric with the symptomatic side. No evidence of thrombus. Normal compressibility. Common Femoral Vein: No evidence of thrombus. Normal compressibility, respiratory phasicity and response to augmentation. Saphenofemoral Junction: No evidence of thrombus. Normal compressibility  and flow on color Doppler imaging. Profunda Femoral Vein: No evidence of thrombus. Normal compressibility and flow on color Doppler imaging. Femoral Vein: No evidence of thrombus. Normal compressibility, respiratory phasicity and response to augmentation. Popliteal Vein: No evidence of thrombus. Normal compressibility, respiratory phasicity and response to augmentation. Calf Veins: No evidence of thrombus. Normal compressibility and flow on color Doppler imaging. Superficial Great Saphenous Vein: No evidence of thrombus. Normal compressibility. Venous Reflux:  None. Other Findings:  None. IMPRESSION: No evidence of deep venous thrombosis. Electronically Signed   By: Jacqulynn Cadet M.D.   On: 04/24/2019 12:45   DG Knee Complete 4 Views Right  Result Date: 04/24/2019 CLINICAL DATA:  pain x 4-5 days no direct trauma pain and swelling for 5 days. Anterior knee pain. EXAM: RIGHT KNEE - COMPLETE 4+ VIEW COMPARISON:  None. FINDINGS: Small joint effusion is present. There is mild degenerative change at the patellofemoral joint. IMPRESSION: Small joint effusion. Electronically Signed  By: Nolon Nations M.D.   On: 04/24/2019 13:07    ____________________________________________   PROCEDURES  Procedure(s) performed (including Critical Care):  Procedures   ____________________________________________   INITIAL IMPRESSION / ASSESSMENT AND PLAN / ED COURSE  As part of my medical decision making, I reviewed the following data within the electronic MEDICAL RECORD NUMBER Notes from prior ED visits and [place] Controlled Substance Database  59 year old male presents to the ED with complaint of bilateral knee pain with right pain greater than the left for the last 5 days.  Patient states that his right knee primarily is the reason why he is here.  He denies any direct trauma to his knee.  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  He denies any previous DVTs or knee injuries.   On exam there is some effusion noted with the right patella.  No gross deformity.  Bevelyn Buckles' sign was negative.  Ultrasound right lower extremity was negative for DVT.  X-ray showed mild arthritic changes with a small effusion.  Patient was made aware.  He states he has a knee immobilizer at home and will begin wearing it.  We discussed ice and elevation.  If it is not improving he will follow-up with the orthopedist.  A prescription for tramadol and naproxen was sent to his pharmacy.  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  He is aware that when he does start driving he cannot take the tramadol as it could cause drowsiness.  He is to return to the emergency department if any severe worsening of his knee pain.  ____________________________________________   FINAL CLINICAL IMPRESSION(S) / ED DIAGNOSES  Final diagnoses:  Acute pain of right knee  Effusion of right knee     ED Discharge Orders         Ordered    traMADol (ULTRAM) 50 MG tablet  Every 6 hours PRN,   Status:  Discontinued     04/24/19 1332    naproxen (NAPROSYN) 500 MG tablet  2 times daily with meals,   Status:  Discontinued     04/24/19 1332    naproxen (NAPROSYN) 500 MG tablet  2 times daily with meals     04/24/19 1342    traMADol (ULTRAM) 50 MG tablet  Every 6 hours PRN     04/24/19 1342           Note:  This document was prepared using Dragon voice recognition software and may include unintentional dictation errors.    Johnn Hai, [name] 04/24/19 1354    Nance Pear, MD 04/24/19 1357

## 2019-04-24 NOTE — Discharge Instructions (Addendum)
Follow-up with your primary care provider or make an appointment with Dr. Harlow Mares who is the orthopedist on call today if you continue to have problems with your right knee.  Also begin taking the naproxen 500 mg twice daily with food.  The tramadol is every 6 hours as needed for pain.  Do not drive or operate machinery while taking the tramadol as it could cause drowsiness.  Ice and elevation when possible.  When you get home apply your knee immobilizer when you are up walking.

## 2019-05-08 DIAGNOSIS — M179 Osteoarthritis of knee, unspecified: Secondary | ICD-10-CM | POA: Insufficient documentation

## 2019-05-08 DIAGNOSIS — S86919A Strain of unspecified muscle(s) and tendon(s) at lower leg level, unspecified leg, initial encounter: Secondary | ICD-10-CM | POA: Insufficient documentation

## 2019-06-01 ENCOUNTER — Ambulatory Visit: Payer: 59 | Attending: Internal Medicine

## 2019-06-01 DIAGNOSIS — Z23 Encounter for immunization: Secondary | ICD-10-CM

## 2019-06-01 NOTE — Progress Notes (Signed)
   Covid-19 Vaccination Clinic  Name:  MYKA LINZMEIER    MRN: [id number] DOB: [birth year]  06/01/2019  Mr. Avram was observed post Covid-19 immunization for 15 minutes without incident. He was provided with Vaccine Information Sheet and instruction to access the V-Safe system.   Mr. Tamminga was instructed to call 911 with any severe reactions post vaccine: Marland Kitchen Difficulty breathing  . Swelling of face and throat  . A fast heartbeat  . A bad rash all over body  . Dizziness and weakness   Immunizations Administered    Name Date Dose VIS Date Route   Pfizer COVID-19 Vaccine 06/01/2019  8:15 AM 0.3 mL 02/24/2019 Intramuscular   Manufacturer: [employer]   Lot: [id number]   [place]: [id number]

## 2019-06-25 ENCOUNTER — Other Ambulatory Visit: Payer: Self-pay

## 2019-06-25 ENCOUNTER — Emergency Department: Payer: 59

## 2019-06-25 ENCOUNTER — Emergency Department
Admission: EM | Admit: 2019-06-25 | Discharge: 2019-06-26 | Disposition: A | Discharge status: Home / Self Care | Payer: 59 | Attending: Emergency Medicine | Admitting: Emergency Medicine

## 2019-06-25 DIAGNOSIS — Z79899 Other long term (current) drug therapy: Secondary | ICD-10-CM | POA: Diagnosis not present

## 2019-06-25 DIAGNOSIS — Z20822 Contact with and (suspected) exposure to covid-19: Secondary | ICD-10-CM | POA: Diagnosis not present

## 2019-06-25 DIAGNOSIS — K5792 Diverticulitis of intestine, part unspecified, without perforation or abscess without bleeding: Secondary | ICD-10-CM | POA: Insufficient documentation

## 2019-06-25 DIAGNOSIS — R1110 Vomiting, unspecified: Secondary | ICD-10-CM | POA: Diagnosis present

## 2019-06-25 DIAGNOSIS — Z87891 Personal history of nicotine dependence: Secondary | ICD-10-CM | POA: Diagnosis not present

## 2019-06-25 LAB — COMPREHENSIVE METABOLIC PANEL
ALT: 15 U/L (ref 0–44)
AST: 15 U/L (ref 15–41)
Albumin: 4 g/dL (ref 3.5–5.0)
Alkaline Phosphatase: 66 U/L (ref 38–126)
Anion gap: 9 (ref 5–15)
BUN: 14 mg/dL (ref 6–20)
CO2: 24 mmol/L (ref 22–32)
Calcium: 9.1 mg/dL (ref 8.9–10.3)
Chloride: 106 mmol/L (ref 98–111)
Creatinine, Ser: 1 mg/dL (ref 0.61–1.24)
GFR calc Af Amer: >60 mL/min (ref >60)
GFR calc non Af Amer: >60 mL/min (ref >60)
Glucose, Bld: 101 mg/dL — ABNORMAL HIGH (ref 70–99)
Potassium: 3.7 mmol/L (ref 3.5–5.1)
Sodium: 139 mmol/L (ref 135–145)
Total Bilirubin: 1 mg/dL (ref 0.3–1.2)
Total Protein: 8 g/dL (ref 6.5–8.1)

## 2019-06-25 LAB — CBC
HCT: 44.2 % (ref 39.0–52.0)
Hemoglobin: 14.2 g/dL (ref 13.0–17.0)
MCH: 27.3 pg (ref 26.0–34.0)
MCHC: 32.1 g/dL (ref 30.0–36.0)
MCV: 84.8 fL (ref 80.0–100.0)
Platelets: 243 10*3/uL (ref 150–400)
RBC: 5.21 MIL/uL (ref 4.22–5.81)
RDW: 15.7 % — ABNORMAL HIGH (ref 11.5–15.5)
WBC: 11.3 10*3/uL — ABNORMAL HIGH (ref 4.0–10.5)
nRBC: 0 % (ref 0.0–0.2)

## 2019-06-25 LAB — URINALYSIS, COMPLETE (UACMP) WITH MICROSCOPIC
Bacteria, UA: NONE SEEN
Bilirubin Urine: NEGATIVE
Glucose, UA: NEGATIVE mg/dL
Ketones, ur: NEGATIVE mg/dL
Leukocytes,Ua: NEGATIVE
Nitrite: NEGATIVE
Protein, ur: NEGATIVE mg/dL
Specific Gravity, Urine: 1.009 (ref 1.005–1.030)
pH: 6 (ref 5.0–8.0)

## 2019-06-25 LAB — POC SARS CORONAVIRUS 2 AG: SARS Coronavirus 2 Ag: NEGATIVE

## 2019-06-25 LAB — LIPASE, BLOOD: Lipase: 18 U/L (ref 11–51)

## 2019-06-25 MED ORDER — ONDANSETRON HCL 4 MG/2ML IJ SOLN
4.0000 mg | Freq: Once | INTRAMUSCULAR | Status: AC
  Administered 2019-06-25: 4 mg via INTRAVENOUS
  Filled 2019-06-25: qty 2

## 2019-06-25 MED ORDER — ONDANSETRON 4 MG PO TBDP: 4.0000 mg | ORAL_TABLET | Freq: Three times a day (TID) | ORAL | 0 refills | Status: DC | PRN

## 2019-06-25 MED ORDER — AMOXICILLIN-POT CLAVULANATE 875-125 MG PO TABS: 1.0000 | ORAL_TABLET | Freq: Two times a day (BID) | ORAL | 0 refills | Status: AC

## 2019-06-25 MED ORDER — AMOXICILLIN-POT CLAVULANATE 875-125 MG PO TABS
1.0000 | ORAL_TABLET | Freq: Once | ORAL | Status: AC
  Administered 2019-06-25: 23:00:00 1 via ORAL
  Filled 2019-06-25: qty 1

## 2019-06-25 MED ORDER — LACTATED RINGERS IV BOLUS
1000.0000 mL | Freq: Once | INTRAVENOUS | Status: AC
  Administered 2019-06-25: 18:00:00 1000 mL via INTRAVENOUS

## 2019-06-25 MED ORDER — MORPHINE SULFATE (PF) 4 MG/ML IV SOLN
4.0000 mg | Freq: Once | INTRAVENOUS | Status: AC
  Administered 2019-06-25: 20:00:00 4 mg via INTRAVENOUS
  Filled 2019-06-25: qty 1

## 2019-06-25 MED ORDER — IOHEXOL 300 MG/ML  SOLN
100.0000 mL | Freq: Once | INTRAMUSCULAR | Status: AC | PRN
  Administered 2019-06-25: 100 mL via INTRAVENOUS

## 2019-06-25 NOTE — ED Notes (Signed)
Pt was taken to CT

## 2019-06-25 NOTE — ED Provider Notes (Signed)
[facility] Emergency Department Provider Note   ____________________________________________   First MD Initiated Contact with Patient 06/25/19 1706     (approximate)  I have reviewed the triage vital signs and the nursing notes.   HISTORY  Chief Complaint Diarrhea and Generalized Body Aches    HPI Marcus Andrews is a 59 y.o. male with no significant past medical history who presents to the ED complaining of vomiting and diarrhea.  Patient reports that for the past 2 days he has had persistent nausea with multiple episodes of nonbilious and nonbloody emesis as well as nonbloody diarrhea.  He has had some diffuse crampy abdominal pain that seems to be exacerbated when he dry heaves.  He has been feeling malaised with body aches and a headache, found his temperature to be 100.6 yesterday.  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  He denies any cough, chest pain, or shortness of breath.        History reviewed. No pertinent past medical history.  There are no problems to display for this patient.   Past Surgical History:  Procedure Laterality Date  . KNEE SURGERY    . TOOTH EXTRACTION      Prior to Admission medications   Medication Sig Start Date End Date Taking? Authorizing Provider  tadalafil (CIALIS) 5 MG tablet Take 1 tablet (5 mg total) by mouth prior to sexual activity. 10/22/14  Yes [provider]  varenicline (CHANTIX) 1 MG tablet Take 1/2 tablet daily for 3 days, the twice daily for 4 days.  Then take 1 tablet (1 mg total) by mouth 2 (two) times daily. 10/22/14  Yes [provider]  amoxicillin-clavulanate (AUGMENTIN) 875-125 MG tablet Take 1 tablet by mouth 2 (two) times daily for 10 days. 06/25/19 07/05/19  Blake Divine, MD  meloxicam (MOBIC) 15 MG tablet meloxicam 15 mg tablet    [provider]  naproxen  (NAPROSYN) 500 MG tablet Take 1 tablet (500 mg total) by mouth 2 (two) times daily with a meal. 04/24/19   Letitia Neri L, [name]  ondansetron (ZOFRAN ODT) 4 MG disintegrating tablet Take 1 tablet (4 mg total) by mouth every 8 (eight) hours as needed for nausea or vomiting. 06/25/19   Blake Divine, MD  sildenafil (REVATIO) 20 MG tablet Take 2-5 tabs as needed for sexual intercourse 11/30/17   Jearld Fenton, NP  sildenafil (VIAGRA) 50 MG tablet Take 0.5 tablets (25 mg total) daily as needed by mouth for erectile dysfunction. 01/28/17   Jearld Fenton, NP  traMADol (ULTRAM) 50 MG tablet Take 1 tablet (50 mg total) by mouth every 6 (six) hours as needed. 04/24/19   Johnn Hai, [name]    Allergies Patient has no known allergies.  Family History  Problem Relation Age of Onset  . Heart disease Father   . Heart attack Father   . Hypertension Sister   . Hyperlipidemia Sister   . Asthma Sister   . Hypertension Brother   . Hyperlipidemia Brother   . Hypertension Maternal Aunt   . Hypertension Maternal Uncle   . Hypertension Paternal Aunt   . Hypertension Paternal Uncle   . Hypertension Maternal Grandmother     Social History Social History   Tobacco Use  . Smoking status: Former Research scientist (life sciences)  . Smokeless tobacco: Never Used  Substance Use Topics  . Alcohol  use: Yes  . Drug use: No    Review of Systems  Constitutional: Positive for fever/chills Eyes: No visual changes. ENT: No sore throat. Cardiovascular: Denies chest pain. Respiratory: Denies shortness of breath. Gastrointestinal: Positive for abdominal pain, nausea, and vomiting.  Positive for diarrhea.  No constipation. Genitourinary: Negative for dysuria. Musculoskeletal: Negative for back pain.  Positive for myalgias. Skin: Negative for rash. Neurological: Positive for headaches, negative for focal weakness or numbness.  ____________________________________________   PHYSICAL EXAM:  VITAL SIGNS: ED Triage Vitals    Enc Vitals Group     BP 06/25/19 1236 (!) 145/97     Pulse Rate 06/25/19 1236 68     Resp 06/25/19 1236 18     Temp 06/25/19 1236 99.6 F (37.6 C)     Temp Source 06/25/19 1236 Oral     SpO2 06/25/19 1236 98 %     Weight 06/25/19 1237 191 lb (86.6 kg)     Height 06/25/19 1237 6' (1.829 m)     Head Circumference --      Peak Flow --      Pain Score 06/25/19 1244 7     Pain Loc --      Pain Edu? --      Excl. in [place]? --     Constitutional: Alert and oriented. Eyes: Conjunctivae are normal. Head: Atraumatic. Nose: No congestion/rhinnorhea. Mouth/Throat: Mucous membranes are moist. Neck: Normal ROM Cardiovascular: Normal rate, regular rhythm. Grossly normal heart sounds. Respiratory: Normal respiratory effort.  No retractions. Lungs CTAB. Gastrointestinal: Soft and nontender. No distention. Genitourinary: deferred Musculoskeletal: No lower extremity tenderness nor edema. Neurologic:  Normal speech and language. No gross focal neurologic deficits are appreciated. Skin:  Skin is warm, dry and intact. No rash noted. Psychiatric: Mood and affect are normal. Speech and behavior are normal.  ____________________________________________   LABS (all labs ordered are listed, but only abnormal results are displayed)  Labs Reviewed  COMPREHENSIVE METABOLIC PANEL - Abnormal; Notable for the following components:      Result Value   Glucose, Bld 101 (*)    All other components within normal limits  CBC - Abnormal; Notable for the following components:   WBC 11.3 (*)    RDW 15.7 (*)    All other components within normal limits  URINALYSIS, COMPLETE (UACMP) WITH MICROSCOPIC - Abnormal; Notable for the following components:   Color, Urine YELLOW (*)    APPearance CLEAR (*)    Hgb urine dipstick MODERATE (*)    All other components within normal limits  SARS CORONAVIRUS 2 (TAT 6-24 HRS)  LIPASE, BLOOD  POC SARS CORONAVIRUS 2 AG -  ED  POC SARS CORONAVIRUS 2 AG    ____________________________________________  EKG  ED ECG REPORT I, Blake Divine, the attending physician, personally viewed and interpreted this ECG.   Date: 06/25/2019  EKG Time: 17:56  Rate: 58  Rhythm: normal sinus rhythm  Axis: Normal  Intervals:none  ST&T Change: None   PROCEDURES  Procedure(s) performed (including Critical Care):  Procedures   ____________________________________________   INITIAL IMPRESSION / ASSESSMENT AND PLAN / ED COURSE       Previously healthy 59 year old male presents to the ED with 2 days of vomiting and diarrhea as well as diffuse crampy abdominal pain.  He has no focal tenderness on his abdominal exam and given his additional complaints of headache, myalgias, and malaise, viral illness seems most likely explanation for his symptoms.  We will hold off on CT scan for now,  lab work thus far is unremarkable.  We will screen EKG, treat with IV Zofran and fluids, plan for p.o. challenge.  Plan to perform point-of-care testing for COVID-19 and follow-up PCR if this is negative.  Patient continued to complain of worsening abdominal pain and decision was made to proceed with CT scan.  This was consistent with diverticulitis, no evidence of perforation or abscess.  Patient was able to tolerate initial dose of Augmentin without difficulty and we will prescribe this for use at home.  Pain is improved following dose of IV morphine and he is appropriate for discharge home.  He was counseled to follow-up with his PCP and to return to the ED for new or worsening symptoms, patient agrees with plan.      ____________________________________________   FINAL CLINICAL IMPRESSION(S) / ED DIAGNOSES  Final diagnoses:  Diverticulitis     ED Discharge Orders         Ordered    amoxicillin-clavulanate (AUGMENTIN) 875-125 MG tablet  2 times daily     06/25/19 2338    ondansetron (ZOFRAN ODT) 4 MG disintegrating tablet  Every 8 hours PRN     06/25/19  2338           Note:  This document was prepared using Dragon voice recognition software and may include unintentional dictation errors.   Blake Divine, MD 06/26/19 0020

## 2019-06-25 NOTE — ED Triage Notes (Signed)
Pt states Friday started with N&V&D, body aches. States temp of 100.6 yesterday. Also c/o HA. A&O, ambulatory. No distress noted.

## 2019-06-26 ENCOUNTER — Ambulatory Visit: Payer: 59 | Attending: Internal Medicine

## 2019-06-26 LAB — SARS CORONAVIRUS 2 (TAT 6-24 HRS): SARS Coronavirus 2: NEGATIVE

## 2019-06-30 ENCOUNTER — Other Ambulatory Visit: Payer: Self-pay

## 2019-06-30 ENCOUNTER — Ambulatory Visit: Payer: 59 | Admitting: Internal Medicine

## 2019-06-30 VITALS — BP 124/84 | HR 91 | Temp 97.7°F | Wt 191.0 lb

## 2019-06-30 DIAGNOSIS — A6001 Herpesviral infection of penis: Secondary | ICD-10-CM

## 2019-06-30 DIAGNOSIS — K5732 Diverticulitis of large intestine without perforation or abscess without bleeding: Secondary | ICD-10-CM

## 2019-06-30 MED ORDER — VALACYCLOVIR HCL 1 G PO TABS: 1000.0000 mg | ORAL_TABLET | Freq: Every day | ORAL | 0 refills | Status: DC | PRN

## 2019-06-30 NOTE — Progress Notes (Signed)
Subjective:    Patient ID: Marcus Andrews, male    DOB: 30-Jun-1960, 59 y.o.   MRN: [id number]  HPI  Patient presents to the clinic today for ER follow-up.  He went to the ER 06/25/2019 with complaint of abdominal pain.  He has slightly elevated white count but other labs were unremarkable.  CT abdomen pelvis was consistent with diverticulitis.  He was treated with Augmentin and Zofran.  He was discharged with prescriptions for Augmentin and Zofran.  Since discharge, he was doing well and then started having more LLQ abdominal pain. He was constipated, but is now having loose stools. He has not noticed any blood in his stool. He had scrambled eggs and french toast this am and noticed he was nauseated after. He is overdue for his colonoscopy. He has had multiple polyps. He would like a referral to GI.  He also reports 2 recent outbreaks of genital herpes. He would like to get a prn RX he can take for outbreaks.   Review of Systems      No past medical history on file.  Current Outpatient Medications  Medication Sig Dispense Refill  . amoxicillin-clavulanate (AUGMENTIN) 875-125 MG tablet Take 1 tablet by mouth 2 (two) times daily for 10 days. 20 tablet 0  . meloxicam (MOBIC) 15 MG tablet meloxicam 15 mg tablet    . naproxen (NAPROSYN) 500 MG tablet Take 1 tablet (500 mg total) by mouth 2 (two) times daily with a meal. 30 tablet 0  . ondansetron (ZOFRAN ODT) 4 MG disintegrating tablet Take 1 tablet (4 mg total) by mouth every 8 (eight) hours as needed for nausea or vomiting. 12 tablet 0  . sildenafil (REVATIO) 20 MG tablet Take 2-5 tabs as needed for sexual intercourse 50 tablet 0  . sildenafil (VIAGRA) 50 MG tablet Take 0.5 tablets (25 mg total) daily as needed by mouth for erectile dysfunction. 10 tablet 0  . tadalafil (CIALIS) 5 MG tablet Take 1 tablet (5 mg total) by mouth prior to sexual activity.    . traMADol (ULTRAM) 50 MG tablet Take 1 tablet (50 mg total) by mouth every 6 (six)  hours as needed. 20 tablet 0  . varenicline (CHANTIX) 1 MG tablet Take 1/2 tablet daily for 3 days, the twice daily for 4 days.  Then take 1 tablet (1 mg total) by mouth 2 (two) times daily.     No current facility-administered medications for this visit.    No Known Allergies  Family History  Problem Relation Age of Onset  . Heart disease Father   . Heart attack Father   . Hypertension Sister   . Hyperlipidemia Sister   . Asthma Sister   . Hypertension Brother   . Hyperlipidemia Brother   . Hypertension Maternal Aunt   . Hypertension Maternal Uncle   . Hypertension Paternal Aunt   . Hypertension Paternal Uncle   . Hypertension Maternal Grandmother     Social History   Socioeconomic History  . Marital status: Single    Spouse name: Not on file  . Number of children: Not on file  . Years of education: Not on file  . Highest education level: Not on file  Occupational History  . Not on file  Tobacco Use  . Smoking status: Former Research scientist (life sciences)  . Smokeless tobacco: Never Used  Substance and Sexual Activity  . Alcohol use: Yes  . Drug use: No  . Sexual activity: Yes    Birth control/protection: Condom  Other  Topics Concern  . Not on file  Social History Narrative  . Not on file   Social Determinants of Health   Financial Resource Strain:   . Difficulty of Paying Living Expenses:   Food Insecurity:   . Worried About Charity fundraiser in the Last Year:   . Arboriculturist in the Last Year:   Transportation Needs:   . Film/video editor (Medical):   Marland Kitchen Lack of Transportation (Non-Medical):   Physical Activity:   . Days of Exercise per Week:   . Minutes of Exercise per Session:   Stress:   . Feeling of Stress :   Social Connections:   . Frequency of Communication with Friends and Family:   . Frequency of Social Gatherings with Friends and Family:   . Attends Religious Services:   . Active Member of Clubs or Organizations:   . Attends Archivist  Meetings:   Marland Kitchen Marital Status:   Intimate Partner Violence:   . Fear of Current or Ex-Partner:   . Emotionally Abused:   Marland Kitchen Physically Abused:   . Sexually Abused:      Constitutional: Denies fever, malaise, fatigue, headache or abrupt weight changes.  HEENT: Denies eye pain, eye redness, ear pain, ringing in the ears, wax buildup, runny nose, nasal congestion, bloody nose, or sore throat. Respiratory: Denies difficulty breathing, shortness of breath, cough or sputum production.   Cardiovascular: Denies chest pain, chest tightness, palpitations or swelling in the hands or feet.  Gastrointestinal: Pt reports intermittent LLQ pain, nausea and loose stools. Denies bloating, constipation, diarrhea or blood in the stool.  GU: Denies urgency, frequency, pain with urination, burning sensation, blood in urine, odor or discharge. Skin: Denies redness, rashes, lesions or ulcercations.   No other specific complaints in a complete review of systems (except as listed in HPI above).  Objective:   Physical Exam  BP 124/84   Pulse 91   Temp 97.7 F (36.5 C) (Temporal)   Wt 191 lb (86.6 kg)   SpO2 98%   BMI 25.90 kg/m   Wt Readings from Last 3 Encounters:  06/25/19 191 lb (86.6 kg)  04/24/19 182 lb 1.6 oz (82.6 kg)  06/30/18 182 lb (82.6 kg)    General: Appears his stated age, well developed, well nourished in NAD. Cardiovascular: Normal rate and rhythm. S1,S2 noted.  No murmur, rubs or gallops noted. Pulmonary/Chest: Normal effort and positive vesicular breath sounds. No respiratory distress. No wheezes, rales or ronchi noted.  Abdomen: Soft and mildly tender in the LLQ. Hypoactive bowel sounds. No distention or masses noted.  Neurological: Alert and oriented.   BMET    Component Value Date/Time   NA 139 06/25/2019 1247   K 3.7 06/25/2019 1247   CL 106 06/25/2019 1247   CO2 24 06/25/2019 1247   GLUCOSE 101 (H) 06/25/2019 1247   BUN 14 06/25/2019 1247   CREATININE 1.00 06/25/2019  1247   CALCIUM 9.1 06/25/2019 1247   GFRNONAA >60 06/25/2019 1247   GFRAA >60 06/25/2019 1247    Lipid Panel     Component Value Date/Time   CHOL 215 (H) 02/15/2017 1303   TRIG 68.0 02/15/2017 1303   HDL 68.40 02/15/2017 1303   CHOLHDL 3 02/15/2017 1303   VLDL 13.6 02/15/2017 1303   LDLCALC 133 (H) 02/15/2017 1303    CBC    Component Value Date/Time   WBC 11.3 (H) 06/25/2019 1247   RBC 5.21 06/25/2019 1247   HGB 14.2  06/25/2019 1247   HCT 44.2 06/25/2019 1247   PLT 243 06/25/2019 1247   MCV 84.8 06/25/2019 1247   MCH 27.3 06/25/2019 1247   MCHC 32.1 06/25/2019 1247   RDW 15.7 (H) 06/25/2019 1247    Hgb A1C No results found for: HGBA1C          Assessment & Plan:   ER Follow up for Diverticulitis:  ER notes, labs and imaging reviewed Continue Augmentin course Advance diet as tolerated Continue Zofran as needed Referral to GI for colonoscopy- [place]  Genital Herpes:  RX for Valtrex 1000 mg daily x 5 days during outbreaks  Make an appt for your annual exam  Webb Silversmith, NP This visit occurred during the SARS-CoV-2 public health emergency.  Safety protocols were in place, including screening questions prior to the visit, additional usage of staff PPE, and extensive cleaning of exam room while observing appropriate contact time as indicated for disinfecting solutions.

## 2019-07-01 ENCOUNTER — Ambulatory Visit: Payer: 59 | Attending: Internal Medicine

## 2019-07-01 DIAGNOSIS — Z23 Encounter for immunization: Secondary | ICD-10-CM

## 2019-07-01 NOTE — Progress Notes (Signed)
   Covid-19 Vaccination Clinic  Name:  Marcus Andrews    MRN: [id number] DOB: [birth year]  07/01/2019  Marcus Andrews was observed post Covid-19 immunization for 15 minutes without incident. He was provided with Vaccine Information Sheet and instruction to access the V-Safe system.   Marcus Andrews was instructed to call 911 with any severe reactions post vaccine: Marland Kitchen Difficulty breathing  . Swelling of face and throat  . A fast heartbeat  . A bad rash all over body  . Dizziness and weakness   Immunizations Administered    Name Date Dose VIS Date Route   Pfizer COVID-19 Vaccine 07/01/2019  1:15 PM 0.3 mL 02/24/2019 Intramuscular   Manufacturer: [place]   Lot: [id number]   [place]: [id number]

## 2019-07-02 ENCOUNTER — Encounter: Payer: Self-pay | Admitting: Internal Medicine

## 2019-07-02 NOTE — Patient Instructions (Signed)

## 2019-07-03 ENCOUNTER — Encounter: Payer: Self-pay | Admitting: Internal Medicine

## 2019-07-05 ENCOUNTER — Encounter: Payer: Self-pay | Admitting: Nurse Practitioner

## 2019-07-05 NOTE — Telephone Encounter (Signed)
Can we have him see GI sooner rather than later? His abdominal pain has increased

## 2019-07-05 NOTE — Telephone Encounter (Signed)
[facility] had a cancellation and pt scheduled for 07-12-19@2pm .  Pt knows to arrive@1 :40 pm.

## 2019-07-12 ENCOUNTER — Encounter: Payer: Self-pay | Admitting: Nurse Practitioner

## 2019-07-12 ENCOUNTER — Ambulatory Visit (INDEPENDENT_AMBULATORY_CARE_PROVIDER_SITE_OTHER): Payer: 59 | Admitting: Nurse Practitioner

## 2019-07-12 ENCOUNTER — Other Ambulatory Visit: Payer: Self-pay

## 2019-07-12 VITALS — BP 110/80 | HR 68 | Temp 97.8°F | Ht 70.08 in | Wt 183.4 lb

## 2019-07-12 DIAGNOSIS — R197 Diarrhea, unspecified: Secondary | ICD-10-CM

## 2019-07-12 DIAGNOSIS — K5732 Diverticulitis of large intestine without perforation or abscess without bleeding: Secondary | ICD-10-CM | POA: Diagnosis not present

## 2019-07-12 DIAGNOSIS — Z800 Family history of malignant neoplasm of digestive organs: Secondary | ICD-10-CM | POA: Diagnosis not present

## 2019-07-12 NOTE — Progress Notes (Signed)
IMPRESSION and PLAN:   59 year old male with history of colon polyps, diverticulitis, [facility] of colon cancer.   # Acute left-sided diverticulitis --Documented by CT scan done in ED on 06/25/2019   --Much better after completion of Augmentin.  Pain 2 out of 10.  Feels like he is getting better every day.  Tolerating a bland diet  # History of colon polyps/family history of colon cancer --- Patient is overdue for surveillance colonoscopy which was due July 2019 --We will schedule surveillance colonoscopy to be done in approximately 4 weeks post episode of diverticulitis. Patient will be scheduled for a colonoscopy. The risks and benefits of colonoscopy with possible polypectomy / biopsies were discussed and the patient agrees to proceed.  --Patient will call us in the interim should he develop recurrent LLQ pain as this might necessitate postponing colonoscopy  #Bowel changes. --Had diarrhea today but had some recent bowel changes surrounding episode of diverticulitis. Not suspicious of C-diff at this point --If stools continue to be loose over the next couple days patient will call our office to arrange for a C. difficile study  # Night sweats --Just started in last few weeks --Checks temp at home, he has been afebrile --We will defer work-up to PCP   HPI:    Primary GI: Dr. Fuller Plan  Chief complaint : Recent diverticulitis  59 year old male with a history of colon polyps and family history colon cancer.  His last colonoscopy here was in 2014.  He is overdue for five year surveillance colonoscopy.   On 06/23/2019 patient developed LLQ pain.  Seen at [facility] ED for evaluation.  White count mildly elevated.  CT scan of the abdomen and pelvis suggested left-sided diverticulitis.  He was discharged home with 10 days of Augmentin.  Patient completed antibiotics on Monday.  Monday was actually the first day he felt reasonably well.  Patient feels like he is getting better every day but  is not back to baseline.  The pain in his left lower abdomen is now only at 2 out of 10.  He is tolerating bland diet.  No nausea.  No fevers but he does complain of frequent night sweats.  No urinary symptoms.  Patient typically has no problems with his bowel movements.  However, just prior to being diagnosed with diverticulitis he had transient diarrhea which quickly changed into having just "clumps of stool" this pattern lasted several days.  Today he had diarrhea again.  Patient mentions that he cannot receive blood products for religious reasons.   Data Reviewed:  06/25/2019 WBC 11.3, hemoglobin 14.2, lipase 18, LFTs normal  06/25/2019 CT scan of the abdomen and pelvis with contrast Probable focal fatty infiltration, multiple subcentimeter hypodense liver lesions too small to further characterize, minute proximal pancreatic duct, focal wall thickening at the descending colon with adjacent inflammatory changes and diverticula present suspicious for acute diverticulitis.  No definite signs for perforation or abscess  Review of systems:     No chest pain, no SOB, no fevers, no urinary sx   Past Medical History:  Diagnosis Date  . Colon polyp   . Diverticulosis     Patient's surgical history, family medical history, social history, medications and allergies were all reviewed in Epic   Serum creatinine: 1 mg/dL 06/25/19 1247 Estimated creatinine clearance: 84.4 mL/min  Current Outpatient Medications  Medication Sig Dispense Refill  . meloxicam (MOBIC) 15 MG tablet meloxicam 15 mg tablet    .  tadalafil (CIALIS) 5 MG tablet Take 1 tablet (5 mg total) by mouth prior to sexual activity.    . traMADol (ULTRAM) 50 MG tablet Take 1 tablet (50 mg total) by mouth every 6 (six) hours as needed. 20 tablet 0  . valACYclovir (VALTREX) 1000 MG tablet Take 1 tablet (1,000 mg total) by mouth daily as needed. 30 tablet 0  . ondansetron (ZOFRAN ODT) 4 MG disintegrating tablet Take 1 tablet (4 mg total)  by mouth every 8 (eight) hours as needed for nausea or vomiting. (Patient not taking: Reported on 07/12/2019) 12 tablet 0   No current facility-administered medications for this visit.    Physical Exam:     BP 110/80 (BP Location: Left Arm, Patient Position: Sitting, Cuff Size: Normal)   Pulse 68   Temp 97.8 F (36.6 C)   Ht 5' 10.08" (1.78 m) Comment: height measured without shoes  Wt 183 lb 6 oz (83.2 kg)   BMI 26.25 kg/m   GENERAL:  Pleasant male in NAD PSYCH: : Cooperative, normal affect CARDIAC:  RRR,  PULM: Normal respiratory effort, lungs CTA bilaterally, no wheezing ABDOMEN:  Nondistended, soft, nontender. No obvious masses, no hepatomegaly,  normal bowel sounds SKIN:  turgor, no lesions seen Musculoskeletal:  Normal muscle tone, normal strength NEURO: Alert and oriented x 3, no focal neurologic deficits   Tye Savoy , NP 07/12/2019, 2:13 PM

## 2019-07-12 NOTE — Patient Instructions (Addendum)
If you are age 58 or older, your body mass index should be between 23-30. Your Body mass index is 26.25 kg/m. If this is out of the aforementioned range listed, please consider follow up with your Primary Care Provider.  If you are age 30 or younger, your body mass index should be between 19-25. Your Body mass index is 26.25 kg/m. If this is out of the aformentioned range listed, please consider follow up with your Primary Care Provider.   Call if stools continue to stay loose or if any recurrent abdominal pain. Ask to speak with Eustaquio Maize, RN.   Continue low fiber diet until abdominal pain in gone then switch to high fiber diet. (Handouts provided).   You have been scheduled for a colonoscopy. Please follow written instructions given to you at your visit today.  Please pick up your prep supplies at the pharmacy within the next 1-3 days. If you use inhalers (even only as needed), please bring them with you on the day of your procedure.

## 2019-07-12 NOTE — Progress Notes (Signed)
Reviewed and agree with management plan.  Alexa Blish T. Audrie Kuri, MD FACG [facility]  

## 2019-07-18 ENCOUNTER — Ambulatory Visit: Payer: 59 | Admitting: Nurse Practitioner

## 2019-08-08 ENCOUNTER — Telehealth: Payer: Self-pay | Admitting: Nurse Practitioner

## 2019-08-08 MED ORDER — NA SULFATE-K SULFATE-MG SULF 17.5-3.13-1.6 GM/177ML PO SOLN: 1.0000 | Freq: Once | ORAL | 0 refills | Status: AC

## 2019-08-08 NOTE — Telephone Encounter (Signed)
Patient is scheduled for procedure coming up is requesting we send the prep medication over to [facility] on file

## 2019-08-08 NOTE — Telephone Encounter (Signed)
Script sent into pharmacy 

## 2019-08-09 ENCOUNTER — Telehealth: Payer: Self-pay | Admitting: Nurse Practitioner

## 2019-08-09 ENCOUNTER — Other Ambulatory Visit: Payer: Self-pay

## 2019-08-09 DIAGNOSIS — K5732 Diverticulitis of large intestine without perforation or abscess without bleeding: Secondary | ICD-10-CM

## 2019-08-09 MED ORDER — AMOXICILLIN-POT CLAVULANATE 875-125 MG PO TABS: 1.0000 | ORAL_TABLET | Freq: Two times a day (BID) | ORAL | 0 refills | Status: DC

## 2019-08-09 NOTE — Telephone Encounter (Signed)
Spoke with patient regarding risks, patient hesitantly agreeable to first available appointment on 09/27/2019 at 2:30 pm, pt advised to call back in a few weeks to see if there have been any cancellations so that his appointment can be moved up. Patient advised of soft, light diet and small frequent meals. Pt advised to call office with any other questions or concerns.

## 2019-08-09 NOTE — Telephone Encounter (Signed)
Please advise. Thank you

## 2019-08-09 NOTE — Telephone Encounter (Signed)
Spoke with patient regarding your recommendation, patient was upset about having to reschedule colonoscopy, patient stated that pain was worse yesterday and felt like "a pinching pain" but today pt describes as " a slight dull pain". Patient was upset about having to push back procedure, he stated that he wanted to figure out what was going on sooner than 4 weeks and wanted to know if there was anything else that can be done. Please advise. RX sent in.

## 2019-08-09 NOTE — Telephone Encounter (Signed)
Suspected recurrent diverticulitis Augmentin 875 mg po bid, #14 Soft, light diet until symptoms resolve Reschedule colonoscopy in about 4 weeks

## 2019-08-09 NOTE — Telephone Encounter (Signed)
Pt states that he has been experiencing stomach pain since last night. Pain is located in the area of his diverticulitis. He is scheduled for a colonoscopy this coming Friday with Dr. Fuller Plan. He wants to know if it is still ok to proceed with procedure given the pain. Pls call him.

## 2019-08-09 NOTE — Telephone Encounter (Signed)
There is a higher risk of perforation with colonoscopy during acute diverticulitis and standard practice is to delay colonoscopy until the diverticulitis has fully resolved, typically a few week. The reason for the delay is patient safety.

## 2019-08-11 ENCOUNTER — Encounter: Payer: Self-pay | Admitting: Gastroenterology

## 2019-08-21 ENCOUNTER — Other Ambulatory Visit: Payer: Self-pay | Admitting: Gastroenterology

## 2019-08-21 DIAGNOSIS — K5732 Diverticulitis of large intestine without perforation or abscess without bleeding: Secondary | ICD-10-CM

## 2019-09-08 ENCOUNTER — Other Ambulatory Visit: Payer: Self-pay

## 2019-09-08 ENCOUNTER — Ambulatory Visit (AMBULATORY_SURGERY_CENTER): Payer: 59 | Admitting: Gastroenterology

## 2019-09-08 ENCOUNTER — Encounter: Payer: Self-pay | Admitting: Gastroenterology

## 2019-09-08 VITALS — BP 124/86 | HR 78 | Temp 96.8°F | Resp 18 | Ht 70.0 in | Wt 183.0 lb

## 2019-09-08 DIAGNOSIS — K5732 Diverticulitis of large intestine without perforation or abscess without bleeding: Secondary | ICD-10-CM | POA: Diagnosis present

## 2019-09-08 DIAGNOSIS — D125 Benign neoplasm of sigmoid colon: Secondary | ICD-10-CM

## 2019-09-08 DIAGNOSIS — D124 Benign neoplasm of descending colon: Secondary | ICD-10-CM

## 2019-09-08 DIAGNOSIS — K635 Polyp of colon: Secondary | ICD-10-CM | POA: Diagnosis not present

## 2019-09-08 DIAGNOSIS — R933 Abnormal findings on diagnostic imaging of other parts of digestive tract: Secondary | ICD-10-CM | POA: Diagnosis not present

## 2019-09-08 DIAGNOSIS — K640 First degree hemorrhoids: Secondary | ICD-10-CM

## 2019-09-08 DIAGNOSIS — Z800 Family history of malignant neoplasm of digestive organs: Secondary | ICD-10-CM

## 2019-09-08 MED ORDER — SODIUM CHLORIDE 0.9 % IV SOLN: 500.0000 mL | Freq: Once | INTRAVENOUS | Status: DC

## 2019-09-08 NOTE — Progress Notes (Signed)
[place]  Pt has received both of covid vaccine

## 2019-09-08 NOTE — Op Note (Signed)
[place] Patient Name: Marcus Andrews Procedure Date: 09/08/2019 11:33 AM MRN: [id number] Endoscopist: Ladene Artist , MD Age: 59 Referring MD:  Date of Birth: [birth year] Gender: Male Account #: [id number] Procedure:                Colonoscopy Indications:              Abnormal CT of the GI tract (colon), Personal                            history of colon polyps, Family history of colon                            cancer, first degree relative < 60 Medicines:                Monitored Anesthesia Care Procedure:                Pre-Anesthesia Assessment:                           - Prior to the procedure, a History and Physical                            was performed, and patient medications and                            allergies were reviewed. The patient's tolerance of                            previous anesthesia was also reviewed. The risks                            and benefits of the procedure and the sedation                            options and risks were discussed with the patient.                            All questions were answered, and informed consent                            was obtained. Prior Anticoagulants: The patient has                            taken no previous anticoagulant or antiplatelet                            agents. ASA Grade Assessment: II - A patient with                            mild systemic disease. After reviewing the risks                            and benefits, the patient was deemed in  satisfactory condition to undergo the procedure.                           After obtaining informed consent, the colonoscope                            was passed under direct vision. Throughout the                            procedure, the patient's blood pressure, pulse, and                            oxygen saturations were monitored continuously. The                            Colonoscope was introduced  through the anus and                            advanced to the the cecum, identified by                            appendiceal orifice and ileocecal valve. The                            ileocecal valve, appendiceal orifice, and rectum                            were photographed. The quality of the bowel                            preparation was good. The colonoscopy was performed                            without difficulty. The patient tolerated the                            procedure well. Scope In: 11:37:46 AM Scope Out: 11:55:36 AM Scope Withdrawal Time: 0 hours 15 minutes 51 seconds  Total Procedure Duration: 0 hours 17 minutes 50 seconds  Findings:                 The perianal and digital rectal examinations were                            normal.                           A 5 mm polyp was found in the descending colon. The                            polyp was sessile. The polyp was removed with a                            cold snare. Resection and retrieval were complete.  A 12 mm polyp was found in the sigmoid colon. The                            polyp was sessile. Possible focal diverticular                            disease related mucosal abnormality. The polyp was                            removed with a hot snare. Resection and retrieval                            were complete.                           Multiple small-mouthed diverticula were found in                            the left colon. There was evidence of diverticular                            spasm. Peri-diverticular erythema was seen.                           Internal hemorrhoids were found during                            retroflexion. The hemorrhoids were medium-sized and                            Grade I (internal hemorrhoids that do not prolapse).                           The exam was otherwise without abnormality on                            direct and retroflexion  views. Complications:            No immediate complications. Estimated blood loss:                            None. Estimated Blood Loss:     Estimated blood loss: none. Impression:               - One 5 mm polyp in the descending colon, removed                            with a cold snare. Resected and retrieved.                           - One 12 mm polyp in the sigmoid colon, removed                            with a hot snare. Resected and retrieved.                           -  Moderate diverticulosis in the left colon.                           - Internal hemorrhoids.                           - The examination was otherwise normal on direct                            and retroflexion views. Recommendation:           - Repeat colonoscopy in 5 years for surveillance.                           - Patient has a contact number available for                            emergencies. The signs and symptoms of potential                            delayed complications were discussed with the                            patient. Return to normal activities tomorrow.                            Written discharge instructions were provided to the                            patient.                           - High fiber diet.                           - Continue present medications.                           - Await pathology results.                           - No aspirin, ibuprofen, naproxen, or other                            non-steroidal anti-inflammatory drugs for 2 weeks                            after polyp removal. Ladene Artist, MD 09/08/2019 12:01:37 PM This report has been signed electronically.

## 2019-09-08 NOTE — Progress Notes (Signed)
Called to room to assist during endoscopic procedure.  Patient ID and intended procedure confirmed with present staff. Received instructions for my participation in the procedure from the performing physician.  

## 2019-09-08 NOTE — Patient Instructions (Addendum)
Thank you for allowing [place] to care for you today!  Await pathology results of polyps removed, approximately 7-10 days by mail.  Recommend next surveillance colonoscopy in 5 years.  Resume previous diet and medications today.  AVOID Aspirin, Ibuprofen, Naproxen, or other non-steroidal anti-inflammatory medications for 2 weeks post polyp removal.  Return to your normal activities tomorrow.    YOU HAD AN ENDOSCOPIC PROCEDURE TODAY AT [facility]:   Refer to the procedure report that was given to you for any specific questions about what was found during the examination.  If the procedure report does not answer your questions, please call your gastroenterologist to clarify.  If you requested that your care partner not be given the details of your procedure findings, then the procedure report has been included in a sealed envelope for you to review at your convenience later.  YOU SHOULD EXPECT: Some feelings of bloating in the abdomen. Passage of more gas than usual.  Walking can help get rid of the air that was put into your GI tract during the procedure and reduce the bloating. If you had a lower endoscopy (such as a colonoscopy or flexible sigmoidoscopy) you may notice spotting of blood in your stool or on the toilet paper. If you underwent a bowel prep for your procedure, you may not have a normal bowel movement for a few days.  Please Note:  You might notice some irritation and congestion in your nose or some drainage.  This is from the oxygen used during your procedure.  There is no need for concern and it should clear up in a day or so.  SYMPTOMS TO REPORT IMMEDIATELY:   Following lower endoscopy (colonoscopy or flexible sigmoidoscopy):  Excessive amounts of blood in the stool  Significant tenderness or worsening of abdominal pains  Swelling of the abdomen that is new, acute  Fever of 100F or higher   For urgent or emergent issues, a gastroenterologist can be reached at  any hour by calling [phone]. Do not use MyChart messaging for urgent concerns.    DIET:  We do recommend a small meal at first, but then you may proceed to your regular diet.  Drink plenty of fluids but you should avoid alcoholic beverages for 24 hours.  ACTIVITY:  You should plan to take it easy for the rest of today and you should NOT DRIVE or use heavy machinery until tomorrow (because of the sedation medicines used during the test).    FOLLOW UP: Our staff will call the number listed on your records 48-72 hours following your procedure to check on you and address any questions or concerns that you may have regarding the information given to you following your procedure. If we do not reach you, we will leave a message.  We will attempt to reach you two times.  During this call, we will ask if you have developed any symptoms of COVID 19. If you develop any symptoms (ie: fever, flu-like symptoms, shortness of breath, cough etc.) before then, please call [phone].  If you test positive for Covid 19 in the 2 weeks post procedure, please call and report this information to [place].    If any biopsies were taken you will be contacted by phone or by letter within the next 1-3 weeks.  Please call us at [phone] if you have not heard about the biopsies in 3 weeks.    SIGNATURES/CONFIDENTIALITY: You and/or your care partner have signed paperwork which will be  entered into your electronic medical record.  These signatures attest to the fact that that the information above on your After Visit Summary has been reviewed and is understood.  Full responsibility of the confidentiality of this discharge information lies with you and/or your care-partner. 

## 2019-09-08 NOTE — Progress Notes (Signed)
To PACU, VSS. Report to [email or web address] 

## 2019-09-12 ENCOUNTER — Telehealth: Payer: Self-pay | Admitting: *Deleted

## 2019-09-12 NOTE — Telephone Encounter (Signed)
Attempted 2nd f/u phone call. No answer. Left message.  °

## 2019-09-12 NOTE — Telephone Encounter (Signed)
Message left

## 2019-09-15 ENCOUNTER — Encounter: Payer: Self-pay | Admitting: Gastroenterology

## 2019-09-19 ENCOUNTER — Other Ambulatory Visit: Payer: Self-pay | Admitting: Emergency Medicine

## 2019-09-21 ENCOUNTER — Other Ambulatory Visit: Payer: Self-pay

## 2019-09-21 ENCOUNTER — Ambulatory Visit: Payer: 59 | Admitting: Internal Medicine

## 2019-09-21 NOTE — Progress Notes (Signed)
Pt did not need appt, just needed to follow up with GI  Webb Silversmith, NP

## 2019-09-27 ENCOUNTER — Encounter: Payer: Self-pay | Admitting: Gastroenterology

## 2019-10-06 ENCOUNTER — Telehealth: Payer: Self-pay | Admitting: Gastroenterology

## 2019-10-06 NOTE — Telephone Encounter (Signed)
Spoke with patient, he reports bloating since colon, advised to get Gas X  OTC and try for 1 week, if no relief patient will call us back. Pt requesting diet to follow, sent patient a My Chart message with some recommendations.

## 2019-12-11 ENCOUNTER — Ambulatory Visit (INDEPENDENT_AMBULATORY_CARE_PROVIDER_SITE_OTHER): Payer: 59 | Admitting: Nurse Practitioner

## 2019-12-11 ENCOUNTER — Encounter: Payer: Self-pay | Admitting: Nurse Practitioner

## 2019-12-11 VITALS — BP 120/84 | HR 64 | Ht 70.0 in | Wt 193.2 lb

## 2019-12-11 DIAGNOSIS — R140 Abdominal distension (gaseous): Secondary | ICD-10-CM | POA: Diagnosis not present

## 2019-12-11 DIAGNOSIS — R142 Eructation: Secondary | ICD-10-CM

## 2019-12-11 NOTE — Progress Notes (Signed)
Reviewed and agree with management plan.  Tracey Stewart T. Bartholomew Ramesh, MD FACG [facility]  

## 2019-12-11 NOTE — Patient Instructions (Addendum)
If you are age 58 or older, your body mass index should be between 23-30. Your Body mass index is 27.73 kg/m. If this is out of the aforementioned range listed, please consider follow up with your Primary Care Provider.  If you are age 1 or younger, your body mass index should be between 19-25. Your Body mass index is 27.73 kg/m. If this is out of the aformentioned range listed, please consider follow up with your Primary Care Provider.   Try a low gas diet. We have given you information for a Gas and flatulence prevention diet.  Due to recent changes in healthcare laws, you may see the results of your imaging and laboratory studies on MyChart before your provider has had a chance to review them.  We understand that in some cases there may be results that are confusing or concerning to you. Not all laboratory results come back in the same time frame and the provider may be waiting for multiple results in order to interpret others.  Please give [place] 48 hours in order for your provider to thoroughly review all the results before contacting the office for clarification of your results.   Thank you for choosing me and [facility]  Tye Savoy, NP

## 2019-12-11 NOTE — Progress Notes (Signed)
ASSESSMENT AND PLAN    # Bloating / distention / belching --low gas / bloating diet discussed and provided to patient. For the next couple of weeks he will discontinue sodas --Can try Gas-x --If no improvement with alterations in diet then will try Align probiotic x 30 days.  --If still no improvement consider course of Xifaxan or Flagyl for ? SIBO --Follow up with me in ~ 4 weeks.   HISTORY OF PRESENT ILLNESS     Primary Gastroenterologist :  Lucio Edward, MD  Chief Complaint : bloating.   Marcus Andrews is a 59 y.o. male with PMH / [place] significant for,  but not necessarily limited to:  Patient has a personal history of colon polyps and a fmh of colon cancer in his mother in her late 29's. Marland Kitchen He had two hyperplastic colon removed in June 2025.   Patient comes in with complaints of excessive bloating and belching. No excessive flatulence. No associated abdominal discomfort but he sometimes gets visible distention, mainly in the upper abdomen.. Generally belching nor BM helps the bloating / distention.  He believes the symptoms started several months ago. Wakes up bloated so symptoms not postprandial. He can't relate the bloating to any certain foods. He doesn't use artificial sweetners. He drinks two sodas a day. No nausea or vomiting. He lost 11 pounds during episode of diverticulitis in April but has regained all the weight. No other GI complaints. No GERD symptoms.  BMs normal, he has two - three a day.    Data Reviewed: 06/25/19 CMP okay WBC 11.3, hgb 14.2  Previous Endoscopic Evaluations / Pertinent Studies:   Past Medical History:  Diagnosis Date  . Arthritis   . Colon polyp   . Diverticulosis   . Refusal of blood transfusions as patient is Jehovah's Witness     Current Medications, Allergies, Past Surgical History, Family History and Social History were reviewed in [employer] record.   Current Outpatient Medications  Medication Sig  Dispense Refill  . meloxicam (MOBIC) 15 MG tablet meloxicam 15 mg tablet    . ondansetron (ZOFRAN ODT) 4 MG disintegrating tablet Take 1 tablet (4 mg total) by mouth every 8 (eight) hours as needed for nausea or vomiting. 12 tablet 0  . tadalafil (CIALIS) 5 MG tablet Take 1 tablet (5 mg total) by mouth prior to sexual activity.    . traMADol (ULTRAM) 50 MG tablet Take 1 tablet (50 mg total) by mouth every 6 (six) hours as needed. 20 tablet 0  . valACYclovir (VALTREX) 1000 MG tablet Take 1 tablet (1,000 mg total) by mouth daily as needed. 30 tablet 0   No current facility-administered medications for this visit.    Review of Systems: No chest pain. No shortness of breath. No urinary complaints.   PHYSICAL EXAM :    Wt Readings from Last 3 Encounters:  12/11/19 193 lb 4 oz (87.7 kg)  09/08/19 183 lb (83 kg)  07/12/19 183 lb 6 oz (83.2 kg)    BP 120/84 (BP Location: Left Arm, Patient Position: Sitting, Cuff Size: Normal)   Pulse 64   Ht 5\' 10"  (1.778 m)   Wt 193 lb 4 oz (87.7 kg)   BMI 27.73 kg/m  Constitutional:  Pleasant male in no acute distress. Psychiatric: Normal mood and affect. Behavior is normal. EENT: Pupils normal.  Conjunctivae are normal. No scleral icterus. Neck supple.  Cardiovascular: Normal rate, regular rhythm. No edema Pulmonary/chest: Effort normal and breath  sounds normal. No wheezing, rales or rhonchi. Abdominal: Soft, nondistended, nontender. Bowel sounds active throughout. There are no masses palpable. No hepatomegaly. Neurological: Alert and oriented to person place and time. Skin: Skin is warm and dry. No rashes noted.  Tye Savoy, NP  12/11/2019, 8:41 AM  I spent 30 minutes total reviewing records, obtaining history, performing exam, counseling patient and documenting visit / findings.

## 2020-01-10 ENCOUNTER — Ambulatory Visit: Payer: 59 | Admitting: Nurse Practitioner

## 2020-01-22 ENCOUNTER — Ambulatory Visit: Payer: 59 | Admitting: Physician Assistant

## 2020-01-29 ENCOUNTER — Other Ambulatory Visit: Payer: Self-pay | Admitting: Orthopedic Surgery

## 2020-01-29 ENCOUNTER — Encounter: Payer: Self-pay | Admitting: Orthopedic Surgery

## 2020-01-29 ENCOUNTER — Other Ambulatory Visit: Payer: Self-pay

## 2020-01-29 ENCOUNTER — Other Ambulatory Visit
Admission: RE | Admit: 2020-01-29 | Discharge: 2020-01-29 | Disposition: A | Discharge status: Home / Self Care | Payer: 59 | Source: Ambulatory Visit | Attending: Orthopedic Surgery | Admitting: Orthopedic Surgery

## 2020-01-29 NOTE — H&P (Signed)
Marcus Andrews MRN:  [id number] DOB/SEX:  Mar 29, 1960/male  CHIEF COMPLAINT:  Painful right Knee  HISTORY: Patient is a 59 y.o. male presented with a history of pain in the right knee. Onset of symptoms was abrupt starting several months ago with rapidly worsening course since that time. Prior procedures on the knee include none. Patient has been treated conservatively with over-the-counter NSAIDs and activity modification. Patient currently rates pain in the knee at 9 out of 10 with activity. There is no pain at night.  PAST MEDICAL HISTORY: There are no problems to display for this patient.  Past Medical History:  Diagnosis Date   Arthritis    Colon polyp    Diverticulosis    Refusal of blood transfusions as patient is Jehovah's Witness    Past Surgical History:  Procedure Laterality Date   COLONOSCOPY     MENISCUS REPAIR Left 01/2014   TOOTH EXTRACTION       MEDICATIONS:  (Not in a hospital admission)   ALLERGIES:  No Known Allergies  REVIEW OF SYSTEMS:  Pertinent items noted in HPI and remainder of comprehensive ROS otherwise negative.   FAMILY HISTORY:   Family History  Problem Relation Age of Onset   Breast cancer Mother    Colon cancer Mother 26   Colon polyps Mother    Heart disease Father    Heart attack Father    Hypertension Sister    Hyperlipidemia Sister    Asthma Sister    Hypertension Brother    Hyperlipidemia Brother    Deep vein thrombosis Brother    Hypertension Maternal Aunt    Hypertension Maternal Uncle    Liver cancer Maternal Uncle    Hypertension Paternal Aunt    Hypertension Paternal Uncle    Hypertension Maternal Grandmother    Diabetes Maternal Grandmother    Cancer Maternal Aunt        type unknown   Esophageal cancer Neg Hx    Rectal cancer Neg Hx    Stomach cancer Neg Hx     SOCIAL HISTORY:   Social History   Tobacco Use   Smoking status: Former Smoker    Types: Cigarettes    Quit date:  05/28/1998    Years since quitting: 21.6   Smokeless tobacco: Never Used  Substance Use Topics   Alcohol use: Not Currently     EXAMINATION:  Vital signs in last 24 hours: @VSRANGES @  General appearance: alert, cooperative and no distress Neck: no JVD and supple, symmetrical, trachea midline Lungs: clear to auscultation bilaterally Heart: regular rate and rhythm, S1, S2 normal, no murmur, click, rub or gallop Abdomen: soft, non-tender; bowel sounds normal; no masses,  no organomegaly Extremities: extremities normal, atraumatic, no cyanosis or edema and Homans sign is negative, no sign of DVT Pulses: 2+ and symmetric Skin: Skin color, texture, turgor normal. No rashes or lesions Neurologic: Alert and oriented X 3, normal strength and tone. Normal symmetric reflexes. Normal coordination and gait  Musculoskeletal:  ROM 0-115, Ligaments intact,  Imaging Review Plain radiographs demonstrate mild degenerative joint disease of the right knee. The overall alignment is neutral. The bone quality appears to be good for age and reported activity level.  Assessment/Plan: Meniscal tear, right knee   The patient history, physical examination and imaging studies are consistent with meniscal tear and internal derrangement of the right knee. The patient has failed conservative treatment.  The clearance notes were reviewed.  After discussion with the patient it was felt that Total Knee  Replacement was indicated. The procedure,  risks, and benefits of total knee arthroplasty were presented and reviewed. The risks including but not limited to aseptic loosening, infection, blood clots, vascular injury, stiffness, patella tracking problems complications among others were discussed. The patient acknowledged the explanation, agreed to proceed with the plan.  Carlynn Spry 01/29/2020, 9:12 AM

## 2020-01-29 NOTE — Patient Instructions (Addendum)
Your procedure is scheduled on: Monday, February 05, 2020. Report to Day Surgery inside [facility] 2nd floor. To find out your arrival time please call [phone] between 1PM - 3PM on Friday February 02, 2020.  Remember: Instructions that are not followed completely may result in serious medical risk,  up to and including death, or upon the discretion of your surgeon and anesthesiologist your  surgery may need to be rescheduled.     _X__ 1. Do not eat food after midnight the night before your procedure.                 No chewing gum or hard candies. You may drink clear liquids up to 2 hours                 before you are scheduled to arrive for your surgery- DO not drink clear                 liquids within 2 hours of the start of your surgery.                 Clear Liquids include:  water, apple juice without pulp, clear Gatorade, G2 or                  Gatorade Zero (avoid Red/Purple/Blue), Black Coffee or Tea (Do not add                 anything to coffee or tea).  __X__2.  On the morning of surgery brush your teeth with toothpaste and water, you                may rinse your mouth with mouthwash if you wish.  Do not swallow any toothpaste of mouthwash.     _X__ 3.  No Alcohol for 24 hours before or after surgery.   _X__ 4.  Do Not Smoke or use e-cigarettes For 24 Hours Prior to Your Surgery.                 Do not use any chewable tobacco products for at least 6 hours prior to                 Surgery.  _X__  5.  Do not use any recreational drugs (marijuana, cocaine, heroin, ecstasy, MDMA or other)                For at least one week prior to your surgery.  Combination of these drugs with anesthesia                May have life threatening results.  __X__ 6.  Notify your doctor if there is any change in your medical condition      (cold, fever, infections).     Do not wear jewelry, make-up, hairpins, clips or nail polish. Do not wear lotions,  powders, or perfumes. You may wear deodorant. Do not shave 48 hours prior to surgery. Men may shave face and neck. Do not bring valuables to the hospital.    [facility] is not responsible for any belongings or valuables.  Contacts, dentures or bridgework may not be worn into surgery. Leave your suitcase in the car. After surgery it may be brought to your room. For patients admitted to the hospital, discharge time is determined by your treatment team.   Patients discharged the day of surgery will not be allowed to drive home.   Make arrangements for someone to be  with you for the first 24 hours of your Same Day Discharge.   __X__ Take these medicines the morning of surgery with A SIP OF WATER:    1. traMADol (ULTRAM) 50 MG if needed    ____ Fleet Enema (as directed)   __X__ Use CHG Soap (or wipes) as directed  ____ Use Benzoyl Peroxide Gel as instructed  ____ Use inhalers on the day of surgery  ____ Stop metformin 2 days prior to surgery    ____ Take 1/2 of usual insulin dose the night before surgery. No insulin the morning          of surgery.   ____ Stop Coumadin/Plavix/aspirin   __X__ Stop Anti-inflammatories such as meloxicam (MOBIC), Ibuprofen, Aleve, Advil, naproxen, aspirin and or BC powders.     __X__ Stop supplements until after surgery.    __X__ Do no start any herbal supplements before your procedure.    If you have any questions regarding your pre-procedure instructions,  Please call Pre-admit Testing at [phone].

## 2020-02-01 ENCOUNTER — Ambulatory Visit: Payer: 59 | Admitting: Physician Assistant

## 2020-02-02 ENCOUNTER — Other Ambulatory Visit: Payer: Self-pay

## 2020-02-02 ENCOUNTER — Other Ambulatory Visit
Admission: RE | Admit: 2020-02-02 | Discharge: 2020-02-02 | Disposition: A | Discharge status: Home / Self Care | Payer: 59 | Source: Ambulatory Visit | Attending: Orthopedic Surgery | Admitting: Orthopedic Surgery

## 2020-02-02 DIAGNOSIS — Z20822 Contact with and (suspected) exposure to covid-19: Secondary | ICD-10-CM | POA: Diagnosis not present

## 2020-02-02 DIAGNOSIS — Z01812 Encounter for preprocedural laboratory examination: Secondary | ICD-10-CM | POA: Diagnosis present

## 2020-02-03 LAB — SARS CORONAVIRUS 2 (TAT 6-24 HRS): SARS Coronavirus 2: NEGATIVE

## 2020-02-04 MED ORDER — ORAL CARE MOUTH RINSE: 15.0000 mL | Freq: Once | OROMUCOSAL | Status: AC

## 2020-02-04 MED ORDER — LACTATED RINGERS IV SOLN: INTRAVENOUS | Status: DC

## 2020-02-04 MED ORDER — FAMOTIDINE 20 MG PO TABS: 20.0000 mg | ORAL_TABLET | Freq: Once | ORAL | Status: AC

## 2020-02-04 MED ORDER — CHLORHEXIDINE GLUCONATE 0.12 % MT SOLN: 15.0000 mL | Freq: Once | OROMUCOSAL | Status: AC

## 2020-02-04 MED ORDER — CEFAZOLIN SODIUM-DEXTROSE 2-4 GM/100ML-% IV SOLN
2.0000 g | INTRAVENOUS | Status: AC
  Administered 2020-02-05: 2 g via INTRAVENOUS

## 2020-02-05 ENCOUNTER — Ambulatory Visit
Admission: RE | Admit: 2020-02-05 | Discharge: 2020-02-05 | Disposition: A | Discharge status: Home / Self Care | Payer: 59 | Attending: Orthopedic Surgery | Admitting: Orthopedic Surgery

## 2020-02-05 ENCOUNTER — Other Ambulatory Visit: Payer: Self-pay

## 2020-02-05 ENCOUNTER — Ambulatory Visit: Payer: 59 | Admitting: Certified Registered Nurse Anesthetist

## 2020-02-05 ENCOUNTER — Encounter
Admission: RE | Disposition: A | Discharge status: Home / Self Care | Payer: Self-pay | Source: Home / Self Care | Attending: Orthopedic Surgery

## 2020-02-05 ENCOUNTER — Encounter: Payer: Self-pay | Admitting: Orthopedic Surgery

## 2020-02-05 DIAGNOSIS — X58XXXA Exposure to other specified factors, initial encounter: Secondary | ICD-10-CM | POA: Insufficient documentation

## 2020-02-05 DIAGNOSIS — M2341 Loose body in knee, right knee: Secondary | ICD-10-CM | POA: Insufficient documentation

## 2020-02-05 DIAGNOSIS — S83281A Other tear of lateral meniscus, current injury, right knee, initial encounter: Secondary | ICD-10-CM | POA: Diagnosis not present

## 2020-02-05 DIAGNOSIS — M94261 Chondromalacia, right knee: Secondary | ICD-10-CM | POA: Diagnosis not present

## 2020-02-05 DIAGNOSIS — S83206A Unspecified tear of unspecified meniscus, current injury, right knee, initial encounter: Secondary | ICD-10-CM | POA: Diagnosis present

## 2020-02-05 HISTORY — PX: KNEE ARTHROSCOPY WITH MEDIAL MENISECTOMY: SHX5651

## 2020-02-05 SURGERY — ARTHROSCOPY, KNEE, WITH MEDIAL MENISCECTOMY
Anesthesia: General | Site: Knee | Laterality: Right

## 2020-02-05 MED ORDER — ONDANSETRON HCL 4 MG/2ML IJ SOLN: 4.0000 mg | Freq: Once | INTRAMUSCULAR | Status: DC | PRN

## 2020-02-05 MED ORDER — DEXAMETHASONE SODIUM PHOSPHATE 10 MG/ML IJ SOLN
INTRAMUSCULAR | Status: DC | PRN
  Administered 2020-02-05: 10 mg via INTRAVENOUS

## 2020-02-05 MED ORDER — MIDAZOLAM HCL 2 MG/2ML IJ SOLN
INTRAMUSCULAR | Status: AC
  Filled 2020-02-05: qty 2

## 2020-02-05 MED ORDER — FAMOTIDINE 20 MG PO TABS
ORAL_TABLET | ORAL | Status: AC
  Administered 2020-02-05: 20 mg via ORAL
  Filled 2020-02-05: qty 1

## 2020-02-05 MED ORDER — KETOROLAC TROMETHAMINE 15 MG/ML IJ SOLN
INTRAMUSCULAR | Status: AC
  Filled 2020-02-05: qty 1

## 2020-02-05 MED ORDER — FENTANYL CITRATE (PF) 100 MCG/2ML IJ SOLN
INTRAMUSCULAR | Status: DC | PRN
  Administered 2020-02-05 (×2): 25 ug via INTRAVENOUS
  Administered 2020-02-05 (×2): 50 ug via INTRAVENOUS

## 2020-02-05 MED ORDER — ONDANSETRON HCL 4 MG PO TABS: 4.0000 mg | ORAL_TABLET | Freq: Four times a day (QID) | ORAL | Status: DC | PRN

## 2020-02-05 MED ORDER — BUPIVACAINE-EPINEPHRINE (PF) 0.25% -1:200000 IJ SOLN
INTRAMUSCULAR | Status: DC | PRN
  Administered 2020-02-05: 12 mL

## 2020-02-05 MED ORDER — FENTANYL CITRATE (PF) 100 MCG/2ML IJ SOLN
INTRAMUSCULAR | Status: AC
  Administered 2020-02-05: 25 ug via INTRAVENOUS
  Filled 2020-02-05: qty 2

## 2020-02-05 MED ORDER — ACETAMINOPHEN 10 MG/ML IV SOLN
INTRAVENOUS | Status: AC
  Filled 2020-02-05: qty 100

## 2020-02-05 MED ORDER — FENTANYL CITRATE (PF) 100 MCG/2ML IJ SOLN
INTRAMUSCULAR | Status: AC
  Filled 2020-02-05: qty 2

## 2020-02-05 MED ORDER — OXYCODONE HCL 5 MG/5ML PO SOLN: 5.0000 mg | Freq: Once | ORAL | Status: DC | PRN

## 2020-02-05 MED ORDER — METOCLOPRAMIDE HCL 5 MG/ML IJ SOLN: 5.0000 mg | Freq: Three times a day (TID) | INTRAMUSCULAR | Status: DC | PRN

## 2020-02-05 MED ORDER — DEXMEDETOMIDINE (PRECEDEX) IN NS 20 MCG/5ML (4 MCG/ML) IV SYRINGE
PREFILLED_SYRINGE | INTRAVENOUS | Status: DC | PRN
  Administered 2020-02-05: 4 ug via INTRAVENOUS
  Administered 2020-02-05: 2 ug via INTRAVENOUS
  Administered 2020-02-05: 4 ug via INTRAVENOUS
  Administered 2020-02-05: 2 ug via INTRAVENOUS

## 2020-02-05 MED ORDER — MORPHINE SULFATE (PF) 2 MG/ML IV SOLN: 0.5000 mg | INTRAVENOUS | Status: DC | PRN

## 2020-02-05 MED ORDER — CEFAZOLIN SODIUM-DEXTROSE 2-4 GM/100ML-% IV SOLN
INTRAVENOUS | Status: AC
  Filled 2020-02-05: qty 100

## 2020-02-05 MED ORDER — LACTATED RINGERS IV SOLN: INTRAVENOUS | Status: DC

## 2020-02-05 MED ORDER — PHENYLEPHRINE HCL (PRESSORS) 10 MG/ML IV SOLN
INTRAVENOUS | Status: DC | PRN
  Administered 2020-02-05 (×3): 50 ug via INTRAVENOUS

## 2020-02-05 MED ORDER — ACETAMINOPHEN 325 MG PO TABS: 325.0000 mg | ORAL_TABLET | Freq: Four times a day (QID) | ORAL | Status: DC | PRN

## 2020-02-05 MED ORDER — METHYLPREDNISOLONE ACETATE 40 MG/ML IJ SUSP
INTRAMUSCULAR | Status: AC
  Filled 2020-02-05: qty 1

## 2020-02-05 MED ORDER — MIDAZOLAM HCL 2 MG/2ML IJ SOLN
INTRAMUSCULAR | Status: DC | PRN
  Administered 2020-02-05: 2 mg via INTRAVENOUS

## 2020-02-05 MED ORDER — PROPOFOL 10 MG/ML IV BOLUS
INTRAVENOUS | Status: AC
  Filled 2020-02-05: qty 20

## 2020-02-05 MED ORDER — KETOROLAC TROMETHAMINE 30 MG/ML IJ SOLN
INTRAMUSCULAR | Status: AC
  Filled 2020-02-05: qty 1

## 2020-02-05 MED ORDER — HYDROCODONE-ACETAMINOPHEN 5-325 MG PO TABS: 1.0000 | ORAL_TABLET | ORAL | Status: DC | PRN

## 2020-02-05 MED ORDER — HYDROCODONE-ACETAMINOPHEN 5-325 MG PO TABS: 1.0000 | ORAL_TABLET | Freq: Four times a day (QID) | ORAL | 0 refills | Status: AC | PRN

## 2020-02-05 MED ORDER — HYDROCODONE-ACETAMINOPHEN 7.5-325 MG PO TABS
ORAL_TABLET | ORAL | Status: AC
  Filled 2020-02-05: qty 1

## 2020-02-05 MED ORDER — ONDANSETRON HCL 4 MG/2ML IJ SOLN: 4.0000 mg | Freq: Four times a day (QID) | INTRAMUSCULAR | Status: DC | PRN

## 2020-02-05 MED ORDER — DEXMEDETOMIDINE (PRECEDEX) IN NS 20 MCG/5ML (4 MCG/ML) IV SYRINGE
PREFILLED_SYRINGE | INTRAVENOUS | Status: AC
  Filled 2020-02-05: qty 5

## 2020-02-05 MED ORDER — ACETAMINOPHEN 10 MG/ML IV SOLN
INTRAVENOUS | Status: DC | PRN
  Administered 2020-02-05: 1000 mg via INTRAVENOUS

## 2020-02-05 MED ORDER — OXYCODONE HCL 5 MG PO TABS: 5.0000 mg | ORAL_TABLET | Freq: Once | ORAL | Status: DC | PRN

## 2020-02-05 MED ORDER — HYDROCODONE-ACETAMINOPHEN 7.5-325 MG PO TABS
ORAL_TABLET | ORAL | Status: AC
  Administered 2020-02-05: 1 via ORAL
  Filled 2020-02-05: qty 1

## 2020-02-05 MED ORDER — ONDANSETRON HCL 4 MG/2ML IJ SOLN
INTRAMUSCULAR | Status: DC | PRN
  Administered 2020-02-05: 4 mg via INTRAVENOUS

## 2020-02-05 MED ORDER — METOCLOPRAMIDE HCL 10 MG PO TABS: 5.0000 mg | ORAL_TABLET | Freq: Three times a day (TID) | ORAL | Status: DC | PRN

## 2020-02-05 MED ORDER — LIDOCAINE HCL (CARDIAC) PF 100 MG/5ML IV SOSY
PREFILLED_SYRINGE | INTRAVENOUS | Status: DC | PRN
  Administered 2020-02-05: 100 mg via INTRAVENOUS

## 2020-02-05 MED ORDER — EPINEPHRINE PF 1 MG/ML IJ SOLN
INTRAMUSCULAR | Status: AC
  Filled 2020-02-05: qty 4

## 2020-02-05 MED ORDER — HYDROCODONE-ACETAMINOPHEN 7.5-325 MG PO TABS
1.0000 | ORAL_TABLET | ORAL | Status: DC | PRN
  Administered 2020-02-05: 1 via ORAL
  Filled 2020-02-05 (×3): qty 2

## 2020-02-05 MED ORDER — ROPIVACAINE HCL 5 MG/ML IJ SOLN
INTRAMUSCULAR | Status: AC
  Filled 2020-02-05: qty 20

## 2020-02-05 MED ORDER — CHLORHEXIDINE GLUCONATE 0.12 % MT SOLN
OROMUCOSAL | Status: AC
  Administered 2020-02-05: 15 mL via OROMUCOSAL
  Filled 2020-02-05: qty 15

## 2020-02-05 MED ORDER — FENTANYL CITRATE (PF) 100 MCG/2ML IJ SOLN
25.0000 ug | INTRAMUSCULAR | Status: DC | PRN
  Administered 2020-02-05: 25 ug via INTRAVENOUS
  Administered 2020-02-05 (×2): 50 ug via INTRAVENOUS

## 2020-02-05 MED ORDER — LACTATED RINGERS IV SOLN
INTRAVENOUS | Status: DC | PRN
  Administered 2020-02-05: 4 mL

## 2020-02-05 MED ORDER — METHYLPREDNISOLONE ACETATE 40 MG/ML IJ SUSP
INTRAMUSCULAR | Status: DC | PRN
  Administered 2020-02-05: 40 mg

## 2020-02-05 MED ORDER — BUPIVACAINE-EPINEPHRINE (PF) 0.25% -1:200000 IJ SOLN
INTRAMUSCULAR | Status: AC
  Filled 2020-02-05: qty 30

## 2020-02-05 MED ORDER — PROPOFOL 10 MG/ML IV BOLUS
INTRAVENOUS | Status: DC | PRN
  Administered 2020-02-05: 30 mg via INTRAVENOUS
  Administered 2020-02-05: 50 mg via INTRAVENOUS
  Administered 2020-02-05: 170 mg via INTRAVENOUS

## 2020-02-05 MED ORDER — KETOROLAC TROMETHAMINE 15 MG/ML IJ SOLN
15.0000 mg | Freq: Four times a day (QID) | INTRAMUSCULAR | Status: DC
  Administered 2020-02-05: 15 mg via INTRAVENOUS

## 2020-02-05 MED ORDER — ROPIVACAINE HCL 5 MG/ML IJ SOLN
INTRAMUSCULAR | Status: DC | PRN
  Administered 2020-02-05: 20 mL

## 2020-02-05 SURGICAL SUPPLY — 37 items
ADAPTER IRRIG TUBE 2 SPIKE SOL (ADAPTER) ×4 IMPLANT
ADPR TBG 2 SPK PMP STRL ASCP (ADAPTER) ×2
APL PRP STRL LF DISP 70% ISPRP (MISCELLANEOUS) ×1
BLADE FULL RADIUS 3.5 (BLADE) IMPLANT
BLADE INCISOR PLUS 4.5 (BLADE) ×2 IMPLANT
BLADE SHAVER 4.5 DBL SERAT CV (CUTTER) ×2 IMPLANT
BLADE SURG SZ11 CARB STEEL (BLADE) ×2 IMPLANT
BRUSH SCRUB EZ  4% CHG (MISCELLANEOUS) ×4
BRUSH SCRUB EZ 4% CHG (MISCELLANEOUS) ×2 IMPLANT
CHLORAPREP W/TINT 26 (MISCELLANEOUS) ×2 IMPLANT
COOLER POLAR GLACIER W/PUMP (MISCELLANEOUS) ×2 IMPLANT
COVER WAND RF STERILE (DRAPES) ×2 IMPLANT
DRAPE SPLIT 6X30 W/TAPE (DRAPES) ×2 IMPLANT
GAUZE SPONGE 4X4 12PLY STRL (GAUZE/BANDAGES/DRESSINGS) ×2 IMPLANT
GAUZE XEROFORM 1X8 LF (GAUZE/BANDAGES/DRESSINGS) ×2 IMPLANT
GLOVE INDICATOR 8.0 STRL GRN (GLOVE) ×2 IMPLANT
GLOVE SURG ORTHO 8.0 STRL STRW (GLOVE) ×2 IMPLANT
GOWN STRL REUS W/ TWL LRG LVL3 (GOWN DISPOSABLE) ×1 IMPLANT
GOWN STRL REUS W/ TWL XL LVL3 (GOWN DISPOSABLE) ×1 IMPLANT
GOWN STRL REUS W/TWL LRG LVL3 (GOWN DISPOSABLE) ×2
GOWN STRL REUS W/TWL XL LVL3 (GOWN DISPOSABLE) ×2
IV LACTATED RINGER IRRG 3000ML (IV SOLUTION) ×8
IV LR IRRIG 3000ML ARTHROMATIC (IV SOLUTION) ×4 IMPLANT
KIT TURNOVER KIT A (KITS) ×2 IMPLANT
MANIFOLD NEPTUNE II (INSTRUMENTS) ×4 IMPLANT
MAT ABSORB  FLUID 56X50 GRAY (MISCELLANEOUS) ×2
MAT ABSORB FLUID 56X50 GRAY (MISCELLANEOUS) ×1 IMPLANT
NEEDLE SPNL 20GX3.5 QUINCKE YW (NEEDLE) ×2 IMPLANT
PACK ARTHROSCOPY KNEE (MISCELLANEOUS) ×2 IMPLANT
PAD ABD DERMACEA PRESS 5X9 (GAUZE/BANDAGES/DRESSINGS) ×4 IMPLANT
PAD WRAPON POLAR KNEE (MISCELLANEOUS) ×1 IMPLANT
SUT ETHILON 4-0 (SUTURE) ×2
SUT ETHILON 4-0 FS2 18XMFL BLK (SUTURE) ×1
SUTURE ETHLN 4-0 FS2 18XMF BLK (SUTURE) ×1 IMPLANT
TUBING ARTHRO INFLOW-ONLY STRL (TUBING) ×2 IMPLANT
WAND WEREWOLF FLOW 90D (MISCELLANEOUS) ×2 IMPLANT
WRAPON POLAR PAD KNEE (MISCELLANEOUS) ×2

## 2020-02-05 NOTE — H&P (Signed)
The patient has been re-examined, and the chart reviewed, and there have been no interval changes to the documented history and physical.  Plan a right knee scope today.  Anesthesia is not consulted regarding a peripheral nerve block for post-operative pain.  The risks, benefits, and alternatives have been discussed at length, and the patient is willing to proceed.    

## 2020-02-05 NOTE — Op Note (Signed)
PATIENT:  Marcus Andrews  PRE-OPERATIVE DIAGNOSIS:  S83.206A Unsp tear of unsp meniscus, current injury, right knee, init  POST-OPERATIVE DIAGNOSIS:  Same  PROCEDURE:  Right knee arthroscopy with partial medial and lateral menisectomy, chondroplasty, partial synovectomy, removal loose body  SURGEON:  Kurtis Bushman, MD  ANESTHESIA:   General  PREOPERATIVE INDICATIONS:  Marcus Andrews  59 y.o. male with a diagnosis of S83.206A Unsp tear of unsp meniscus, current injury, right knee, init who failed conservative management and elected for surgical management.    The risks benefits and alternatives were discussed with the patient preoperatively including the risks of infection, bleeding, nerve injury, knee stiffness, persistent pain, osteoarthritis and the need for further surgery. Medical  risks include DVT and pulmonary embolism, myocardial infarction, stroke, pneumonia, respiratory failure and death. The patient understood these risks and wished to proceed.   OPERATIVE FINDINGS: The suprapatellar pouch, medial and lateral gutters were inspected and found contain a 1.2 cm loose body along the medial gutter. The undersurface of the patella and landing zone were inspected and grade 2 chondromalacia was noted. There was no evidence of lateral subluxation. The medial compartment showed a complex tear of the posterior horn, then anterior horn was intact and stable to probing. 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  The notch was inspected and the ACL and PCL were intact and stable to probing. The lateral compartment was entered and moderate degenerative changes were identified. The lateral meniscus had anterior and posterior complex tearing.   OPERATIVE PROCEDURE: Patient was met in the preoperative area. The operative extremity was signed with my initials according the hospital's correct site of surgery protocol.  The patient was brought to the operating room where they was  placed supine on the operative table. General anesthesia was administered. The patient was prepped and draped in a sterile fashion.  A timeout was performed to verify the patient's name, date of birth, medical record number, correct site of surgery correct procedure to be performed. It was also used to verify the patient received antibiotics that all appropriate instruments, and radiographic studies were available in the room. Once all in attendance were in agreement, the case began.  Proposed arthroscopy incisions were drawn out with a surgical marker. These were pre-injected with 0.5% marcaine with epinephrine. An 11 blade was used to establish an inferior lateral and inferomedial portals. The inferomedial portal was created using a 18-gauge spinal needle under direct visualization.  A full diagnostic examination of the knee was performed, please see findings for a complete list of results.  Patient had the medial and lateral meniscal tear treated with a 4-0 resector shaver blade and straight duckbill basket. The meniscus was debrided until a stable rim was achieved. A chondroplasty was performed in all three compartments using the shaver on reverse burr mode. A partial synovectomy was also performed in all three compartments using a 4-0 resector shaver blade and electrocautery.  The loose body was identified and removed through the medial portal with a large grasper.  The knee was then copiously lavaged. All arthroscopic instruments were removed. The 2 arthroscopy portals were closed with 4-0 nylon. A dry sterile and compressive dressing was applied. The patient was brought to the PACU in stable condition. I was scrubbed and present for the entire case and all sharp and instrument counts were correct at the conclusion the case. I spoke with the patient's family postoperatively to let them know the case was performed without complication and the  patient was stable in the recovery room.  Kurtis Bushman,  MD

## 2020-02-05 NOTE — Anesthesia Preprocedure Evaluation (Addendum)
Anesthesia Evaluation  Patient identified by MRN, date of birth, ID band Patient awake    Reviewed: Allergy & Precautions, H&P , NPO status , Patient's Chart, lab work & pertinent test results  History of Anesthesia Complications Negative for: history of anesthetic complications  Airway Mallampati: II  TM Distance: >3 FB Neck ROM: full    Dental  (+) Teeth Intact   Pulmonary neg sleep apnea, neg COPD, former smoker,    breath sounds clear to auscultation       Cardiovascular (-) angina(-) Past MI and (-) Cardiac Stents negative cardio ROS  (-) dysrhythmias  Rhythm:regular Rate:Normal     Neuro/Psych negative neurological ROS  negative psych ROS   GI/Hepatic negative GI ROS, Neg liver ROS,   Endo/Other  negative endocrine ROS  Renal/GU      Musculoskeletal  (+) Arthritis ,   Abdominal   Peds  Hematology negative hematology ROS (+)   Anesthesia Other Findings Past Medical History: No date: Arthritis No date: Colon polyp No date: Diverticulosis No date: Refusal of blood transfusions as patient is Jehovah's Witness  Past Surgical History: No date: COLONOSCOPY 01/2014: MENISCUS REPAIR; Left No date: TOOTH EXTRACTION     Reproductive/Obstetrics negative OB ROS                            Anesthesia Physical Anesthesia Plan  ASA: I  Anesthesia Plan: General LMA   Post-op Pain Management:    Induction:   PONV Risk Score and Plan: Ondansetron, Dexamethasone, Midazolam and Treatment may vary due to age or medical condition  Airway Management Planned:   Additional Equipment:   Intra-op Plan:   Post-operative Plan:   Informed Consent: I have reviewed the patients History and Physical, chart, labs and discussed the procedure including the risks, benefits and alternatives for the proposed anesthesia with the patient or authorized representative who has indicated his/her  understanding and acceptance.     Dental Advisory Given  Plan Discussed with: Anesthesiologist, CRNA and Surgeon  Anesthesia Plan Comments:        Anesthesia Quick Evaluation

## 2020-02-05 NOTE — Discharge Instructions (Signed)
Post Op Home Instructions for Knee Arthroscopy ° °1) Do not sit for longer than 1 hour at a time with your leg dangling down.  You should have your legs elevated (higher than your heart) in a recliner chair or couch. ° °2) You may be up walking around as tolerated but should take periodic breaks to elevate your legs.  Discontinue use of crutches when you feel you are able to walk without pain or a limp. ° °3) Work on gentle bending and straightening of the knee. ° °4) You may remove the Ace wrap and dressings two days after surgery.  Place band aids over the incision sites. ° °5) You may shower after you remove the surgical dressing.  You do not need to cover the incision with plastic wrap.  The incision can get wet, but do not submerge under water.  After your sutures have been removed, you should wait 24 hours before submerging incision under water. ° °6) Pain medication can cause constipation.  You should increase your fluid intake, increase your intake of high fiber foods and/or take Metamucil as needed for constipation. ° °7) Continue your physical therapy exercises, as shown at the office, at least twice daily.  You should set up outpatient physical therapy and start within the first week after surgery. ° °8) Continue to use your Polar Pack continuously for 2-3 days after surgery.  After you remove the surgical dressing, it is a good idea to use your Polar Pack or ice pack for 30 minutes after doing your exercises to reduce swelling. ° °9) Do not be surprised if you have increased pain at night.  This usually means you have been a little too active during the day and need to reduce your activities. ° °10) If you develop lower extremity swelling that does not improve after a night of elevation, please call the office.  This could be an early sign of a blood clot. ° °Please call with any questions at [phone] ° ° °AMBULATORY SURGERY  °DISCHARGE INSTRUCTIONS ° ° °1) The drugs that you were given will stay in  your system until tomorrow so for the next 24 hours you should not: ° °A) Drive an automobile °B) Make any legal decisions °C) Drink any alcoholic beverage ° ° °2) You may resume regular meals tomorrow.  Today it is better to start with liquids and gradually work up to solid foods. ° °You may eat anything you prefer, but it is better to start with liquids, then soup and crackers, and gradually work up to solid foods. ° ° °3) Please notify your doctor immediately if you have any unusual bleeding, trouble breathing, redness and pain at the surgery site, drainage, fever, or pain not relieved by medication. ° ° ° °4) Additional Instructions: ° ° ° ° ° ° ° °Please contact your physician with any problems or Same Day Surgery at [phone], Monday through Friday 6 am to 4 pm, or [place] [facility] Main number at [phone]. °

## 2020-02-05 NOTE — Anesthesia Procedure Notes (Signed)
Procedure Name: LMA Insertion Date/Time: 02/05/2020 12:15 PM Performed by: Lily Peer, Lezlie Ritchey, CRNA Pre-anesthesia Checklist: Patient identified, Emergency Drugs available, Suction available and Patient being monitored Patient Re-evaluated:Patient Re-evaluated prior to induction Oxygen Delivery Method: Circle system utilized Preoxygenation: Pre-oxygenation with 100% oxygen Induction Type: IV induction Ventilation: Mask ventilation without difficulty LMA: LMA inserted LMA Size: 4.0 Number of attempts: 1 Placement Confirmation: positive ETCO2 and breath sounds checked- equal and bilateral Tube secured with: Tape Dental Injury: Teeth and Oropharynx as per pre-operative assessment

## 2020-02-05 NOTE — Transfer of Care (Signed)
Immediate Anesthesia Transfer of Care Note  Patient: Marcus Andrews  Procedure(s) Performed: Right knee arthroscopy with partial medial and lateral menisectomy, chondroplasty, partial synovectomy, removal loose body (Right Knee)  Patient Location: PACU  Anesthesia Type:General  Level of Consciousness: drowsy  Airway & Oxygen Therapy: Patient Spontanous Breathing and Patient connected to face mask oxygen  Post-op Assessment: Report given to RN and Post -op Vital signs reviewed and stable  Post vital signs: Reviewed and stable  Last Vitals:  Vitals Value Taken Time  BP 127/88 02/05/20 1322  Temp    Pulse 66 02/05/20 1324  Resp 12 02/05/20 1324  SpO2 100 % 02/05/20 1324  Vitals shown include unvalidated device data.  Last Pain:  Vitals:   02/05/20 1054  TempSrc: Temporal  PainSc: 9       Patients Stated Pain Goal: 3 (09/90/68 9340)  Complications: No complications documented.

## 2020-02-06 ENCOUNTER — Encounter: Payer: Self-pay | Admitting: Orthopedic Surgery

## 2020-02-06 NOTE — Anesthesia Postprocedure Evaluation (Signed)
Anesthesia Post Note  Patient: Marcus Andrews  Procedure(s) Performed: Right knee arthroscopy with partial medial and lateral menisectomy, chondroplasty, partial synovectomy, removal loose body (Right Knee)  Patient location during evaluation: PACU Anesthesia Type: General Level of consciousness: awake and alert Pain management: pain level controlled Vital Signs Assessment: post-procedure vital signs reviewed and stable Respiratory status: spontaneous breathing, nonlabored ventilation and respiratory function stable Cardiovascular status: blood pressure returned to baseline and stable Postop Assessment: no apparent nausea or vomiting Anesthetic complications: no   No complications documented.   Last Vitals:  Vitals:   02/05/20 1453 02/05/20 1545  BP: 125/88 (!) 145/87  Pulse: (!) 51 (!) 56  Resp: 18 18  Temp: (!) 36.4 C   SpO2: 99% 100%    Last Pain:  Vitals:   02/05/20 1545  TempSrc:   PainSc: [place]

## 2020-02-12 DIAGNOSIS — M25661 Stiffness of right knee, not elsewhere classified: Secondary | ICD-10-CM | POA: Insufficient documentation

## 2020-02-12 DIAGNOSIS — M25561 Pain in right knee: Secondary | ICD-10-CM | POA: Insufficient documentation

## 2020-02-14 ENCOUNTER — Telehealth: Payer: Self-pay | Admitting: Internal Medicine

## 2020-02-14 NOTE — Telephone Encounter (Signed)
Will not accept transfer. Would recommend he see if PCP could see him sooner for CPE if no concerns or if acute concern requiring urgent evaluation could make a routine visit.

## 2020-02-14 NOTE — Telephone Encounter (Signed)
Patient called in and wanted to schedule CPE. Advised of next available date stated it was 04/16/20. Pt stated that is not okay and wanted to be seen sooner by different physician. Advised patient that authorization is needed before being scheduled. Pt stated it is okay and wants approval to go from [place] to Dr.Cody due to time issue. Please advise if transfer ok.

## 2020-02-14 NOTE — Telephone Encounter (Signed)
He was told in April 2021 to schedule an appt for his annual exam. Just because he waited this long and can't get in for 2-3 months, isn't my fault. I can't work in any more physicals.

## 2020-02-15 ENCOUNTER — Ambulatory Visit: Payer: 59 | Admitting: Physician Assistant

## 2020-02-15 NOTE — Telephone Encounter (Signed)
Noted, please remove me as the PCP

## 2020-02-15 NOTE — Telephone Encounter (Signed)
Noted and removed

## 2020-02-15 NOTE — Telephone Encounter (Signed)
Called patient and informed of inability to transfer and of next available cpe. Pt stated thank you. Asked patient if he would like to be schedule for cpe. Pt declined and stated he will just find a new doctor. Sending to PCP as FYI.

## 2020-02-22 DIAGNOSIS — Z9889 Other specified postprocedural states: Secondary | ICD-10-CM | POA: Insufficient documentation

## 2020-04-05 ENCOUNTER — Other Ambulatory Visit: Payer: Self-pay | Admitting: Internal Medicine

## 2020-04-29 ENCOUNTER — Telehealth: Payer: Self-pay | Admitting: Nurse Practitioner

## 2020-04-29 NOTE — Telephone Encounter (Signed)
Augmentin 875/125 mg po bid, #14, no refills. If symptoms do not resolve he need an office visit.

## 2020-04-29 NOTE — Telephone Encounter (Signed)
Pt is requesting a refill on his [place]

## 2020-04-29 NOTE — Telephone Encounter (Signed)
Called patient, he is requesting a refill on the Amoxicillin due to having a diverticulitis flair causing abdominal pain. Would it be okay to go ahead and send this in?

## 2020-04-30 MED ORDER — AMOXICILLIN-POT CLAVULANATE 875-125 MG PO TABS: 1.0000 | ORAL_TABLET | Freq: Two times a day (BID) | ORAL | 0 refills | Status: DC

## 2020-04-30 NOTE — Telephone Encounter (Signed)
Patient has been advised that prescription has been sent to the pharmacy. If his symptoms have not resolved after he completes the course to schedule an appointment. Patient expressed understanding.

## 2020-07-20 ENCOUNTER — Other Ambulatory Visit: Payer: Self-pay

## 2020-07-20 ENCOUNTER — Emergency Department: Payer: BLUE CROSS/BLUE SHIELD

## 2020-07-20 ENCOUNTER — Emergency Department
Admission: EM | Admit: 2020-07-20 | Discharge: 2020-07-20 | Disposition: A | Discharge status: Home / Self Care | Payer: BLUE CROSS/BLUE SHIELD | Attending: Emergency Medicine | Admitting: Emergency Medicine

## 2020-07-20 DIAGNOSIS — Z87891 Personal history of nicotine dependence: Secondary | ICD-10-CM | POA: Diagnosis not present

## 2020-07-20 DIAGNOSIS — M79661 Pain in right lower leg: Secondary | ICD-10-CM

## 2020-07-20 DIAGNOSIS — M7989 Other specified soft tissue disorders: Secondary | ICD-10-CM

## 2020-07-20 DIAGNOSIS — R600 Localized edema: Secondary | ICD-10-CM

## 2020-07-20 DIAGNOSIS — R609 Edema, unspecified: Secondary | ICD-10-CM | POA: Diagnosis not present

## 2020-07-20 DIAGNOSIS — Z789 Other specified health status: Secondary | ICD-10-CM

## 2020-07-20 DIAGNOSIS — M79605 Pain in left leg: Secondary | ICD-10-CM

## 2020-07-20 MED ORDER — TRAMADOL HCL 50 MG PO TABS
50.0000 mg | ORAL_TABLET | Freq: Once | ORAL | Status: AC
  Administered 2020-07-20: 50 mg via ORAL
  Filled 2020-07-20: qty 1

## 2020-07-20 NOTE — ED Triage Notes (Signed)
Pt comes pov with possible bloodclot in right leg. States swelling and pain since about 4pm today. Pt is long distance driver.

## 2020-07-20 NOTE — ED Provider Notes (Signed)
[facility] Emergency Department Provider Note ____________________________________________  Time seen: 1800  I have reviewed the triage vital signs and the nursing notes.  HISTORY  Chief Complaint  possible blood clot   HPI Marcus Andrews is a 60 y.o. male presents to the clinic today with complaint of swelling of his bilateral lower extremities and right calf pain.  He reports this started a few hours ago.  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  He describes the pain as achy and cramping.  He has not noticed any redness or warmth of his lower extremities.  He denies chest pain, chest tightness or shortness of breath.  He has no history of clotting disorder but is concerned that he has developed a blood clot in his right leg.  He reports the swelling has actually improved.  He has not taken anything OTC for the symptoms  Past Medical History:  Diagnosis Date  . Arthritis   . Colon polyp   . Diverticulosis   . Refusal of blood transfusions as patient is Jehovah's Witness     There are no problems to display for this patient.   Past Surgical History:  Procedure Laterality Date  . COLONOSCOPY    . KNEE ARTHROSCOPY WITH MEDIAL MENISECTOMY Right 02/05/2020   Procedure: Right knee arthroscopy with partial medial and lateral menisectomy, chondroplasty, partial synovectomy, removal loose body;  Surgeon: Lovell Sheehan, MD;  Location: [facility] ORS;  Service: Orthopedics;  Laterality: Right;  . MENISCUS REPAIR Left 01/2014  . TOOTH EXTRACTION      Prior to Admission medications   Medication Sig Start Date End Date Taking? Authorizing Provider  amoxicillin-clavulanate (AUGMENTIN) 875-125 MG tablet Take 1 tablet by mouth 2 (two) times daily. 04/30/20   Ladene Artist, MD  HYDROcodone-acetaminophen (NORCO/VICODIN) 5-325 MG tablet Take 1 tablet by mouth every 6 (six) hours as needed for moderate pain.  02/05/20 02/04/21  Lovell Sheehan, MD  lactose free nutrition (BOOST) LIQD Take 237 mLs by mouth 3 (three) times daily between meals.    [provider]    Allergies Patient has no known allergies.  Family History  Problem Relation Age of Onset  . Breast cancer Mother   . Colon cancer Mother 49  . Colon polyps Mother   . Heart disease Father   . Heart attack Father   . Hypertension Sister   . Hyperlipidemia Sister   . Asthma Sister   . Hypertension Brother   . Hyperlipidemia Brother   . Deep vein thrombosis Brother   . Hypertension Maternal Aunt   . Hypertension Maternal Uncle   . Liver cancer Maternal Uncle   . Hypertension Paternal Aunt   . Hypertension Paternal Uncle   . Hypertension Maternal Grandmother   . Diabetes Maternal Grandmother   . Cancer Maternal Aunt        type unknown  . Esophageal cancer Neg Hx   . Rectal cancer Neg Hx   . Stomach cancer Neg Hx     Social History Social History   Tobacco Use  . Smoking status: Former Smoker    Types: Cigarettes    Quit date: 05/28/1998    Years since quitting: 22.1  . Smokeless tobacco: Never Used  Vaping Use  . Vaping Use: Never used  Substance Use Topics  . Alcohol use: Yes    Alcohol/week: 1.0 standard drink    Types: 1 Cans  of beer per week    Comment: occassionally   . Drug use: No    Review of Systems  Constitutional: Negative for fever, chills or body aches. Cardiovascular: Positive for swelling of BLE.  Negative for chest pain or chest tightness. Respiratory: Negative for cough or shortness of breath. Musculoskeletal: Positive for right calf pain.  Negative for hip, knee or ankle pain. Skin: Negative for redness or warmth of the lower extremities. Neurological: Negative for focal weakness, tingling or numbness. ____________________________________________  PHYSICAL EXAM:  VITAL SIGNS: ED Triage Vitals  Enc Vitals Group     BP 07/20/20 1728 120/80     Pulse Rate 07/20/20 1728 64      Resp 07/20/20 1728 20     Temp 07/20/20 1728 98.4 F (36.9 C)     Temp Source 07/20/20 1728 Oral     SpO2 07/20/20 1728 97 %     Weight 07/20/20 1728 184 lb (83.5 kg)     Height 07/20/20 1728 6' (1.829 m)     Head Circumference --      Peak Flow --      Pain Score 07/20/20 1729 6     Pain Loc --      Pain Edu? --      Excl. in [place]? --     Constitutional: Alert and oriented. Well appearing and in no distress. Head: Normocephalic. Eyes: Sclera white.  Normal extraocular movements Cardiovascular: Normal rate, regular rhythm.  No lower extremity edema noted.  Pedal pulses 2+ bilaterally.  Negative Homans. He shows me a picture earlier today of his lower extremities that showed tortuous veins of bilateral feet, however this is not evident on exam today. Respiratory: Normal respiratory effort. No wheezes/rales/rhonchi noted. Musculoskeletal: Normal flexion, extension and rotation of the right ankle.  Neurologic:   Normal speech and language. No gross focal neurologic deficits are appreciated. Skin:  Skin is warm, dry and intact. No redness or warmth noted. ____________________________________________   RADIOLOGY  Imaging Orders     US Venous Img Lower Bilateral (DVT) IMPRESSION: No evidence of deep venous thrombosis in either lower extremity.   ______________________________________________   INITIAL IMPRESSION / ASSESSMENT AND PLAN / ED COURSE  Right Calf Pain, Bilateral Lower Extremity Swelling:  DDx include venous insufficiency, muscle strain right calf, DVT Tramadol 50 mg PO x 1 Venous doppler RLE, however pt insisting on ultrasound of both lower extremities despite not having pain, redness or swelling at this time Venous doppler negative for DVT bilaterally Swelling likely related to venous compression from driving, symptoms now resolved Encouraged compression socks while driving, frequent breaks for stretching and walking He will follow up with PCP if symptoms persist or  worsen ____________________________________________  FINAL CLINICAL IMPRESSION(S) / ED DIAGNOSES  Final diagnoses:  Right calf pain  Bilateral lower extremity edema      Jearld Fenton, NP 07/20/20 2116    Duffy Bruce, MD 07/21/20 [phone]

## 2020-07-20 NOTE — Discharge Instructions (Addendum)
You were seen today for swelling of your bilateral lower extremities.  Your ultrasound was negative for DVT.  Please wear compression socks while driving long distances.  It would be good if you could take frequent breaks for stretching and walking to help move fluid back up to your heart.  Please follow-up with your PCP if symptoms persist or worsen.

## 2021-03-21 ENCOUNTER — Emergency Department: Admission: EM | Admit: 2021-03-21 | Discharge: 2021-03-21 | Discharge status: Against Medical Advice | Payer: 59

## 2021-03-21 ENCOUNTER — Other Ambulatory Visit: Payer: Self-pay

## 2021-03-21 ENCOUNTER — Emergency Department
Admission: EM | Admit: 2021-03-21 | Discharge: 2021-03-22 | Disposition: A | Discharge status: Home / Self Care | Payer: 59 | Attending: Emergency Medicine | Admitting: Emergency Medicine

## 2021-03-21 ENCOUNTER — Emergency Department: Payer: 59

## 2021-03-21 ENCOUNTER — Encounter: Payer: Self-pay | Admitting: Emergency Medicine

## 2021-03-21 DIAGNOSIS — S060X0A Concussion without loss of consciousness, initial encounter: Secondary | ICD-10-CM

## 2021-03-21 DIAGNOSIS — R7309 Other abnormal glucose: Secondary | ICD-10-CM | POA: Insufficient documentation

## 2021-03-21 DIAGNOSIS — R29818 Other symptoms and signs involving the nervous system: Secondary | ICD-10-CM | POA: Diagnosis not present

## 2021-03-21 DIAGNOSIS — W228XXA Striking against or struck by other objects, initial encounter: Secondary | ICD-10-CM | POA: Diagnosis not present

## 2021-03-21 DIAGNOSIS — S0990XA Unspecified injury of head, initial encounter: Secondary | ICD-10-CM | POA: Diagnosis not present

## 2021-03-21 LAB — CBG MONITORING, ED: Glucose-Capillary: 120 mg/dL — ABNORMAL HIGH (ref 70–99)

## 2021-03-21 MED ORDER — SODIUM CHLORIDE 0.9% FLUSH: 3.0000 mL | Freq: Once | INTRAVENOUS | Status: DC

## 2021-03-21 NOTE — ED Notes (Signed)
No answer when called for triage 

## 2021-03-21 NOTE — ED Triage Notes (Signed)
Pt c/o hitting his head on the left side of his forehead on Wednesday. Pt c/o right sided facial numbness and the back of his right thigh.

## 2021-03-22 ENCOUNTER — Emergency Department: Payer: 59

## 2021-03-22 DIAGNOSIS — R29818 Other symptoms and signs involving the nervous system: Secondary | ICD-10-CM | POA: Diagnosis not present

## 2021-03-22 LAB — COMPREHENSIVE METABOLIC PANEL
ALT: 15 U/L (ref 0–44)
AST: 18 U/L (ref 15–41)
Albumin: 3.8 g/dL (ref 3.5–5.0)
Alkaline Phosphatase: 77 U/L (ref 38–126)
Anion gap: 6 (ref 5–15)
BUN: 15 mg/dL (ref 6–20)
CO2: 25 mmol/L (ref 22–32)
Calcium: 8.7 mg/dL — ABNORMAL LOW (ref 8.9–10.3)
Chloride: 107 mmol/L (ref 98–111)
Creatinine, Ser: 1.01 mg/dL (ref 0.61–1.24)
GFR, Estimated: >60 mL/min (ref >60)
Glucose, Bld: 121 mg/dL — ABNORMAL HIGH (ref 70–99)
Potassium: 3.3 mmol/L — ABNORMAL LOW (ref 3.5–5.1)
Sodium: 138 mmol/L (ref 135–145)
Total Bilirubin: 0.7 mg/dL (ref 0.3–1.2)
Total Protein: 7.1 g/dL (ref 6.5–8.1)

## 2021-03-22 LAB — CBC
HCT: 42.2 % (ref 39.0–52.0)
Hemoglobin: 13.7 g/dL (ref 13.0–17.0)
MCH: 27.5 pg (ref 26.0–34.0)
MCHC: 32.5 g/dL (ref 30.0–36.0)
MCV: 84.7 fL (ref 80.0–100.0)
Platelets: 239 10*3/uL (ref 150–400)
RBC: 4.98 MIL/uL (ref 4.22–5.81)
RDW: 16 % — ABNORMAL HIGH (ref 11.5–15.5)
WBC: 6.3 10*3/uL (ref 4.0–10.5)
nRBC: 0 % (ref 0.0–0.2)

## 2021-03-22 LAB — DIFFERENTIAL
Abs Immature Granulocytes: 0 10*3/uL (ref 0.00–0.07)
Basophils Absolute: 0 10*3/uL (ref 0.0–0.1)
Basophils Relative: 1 %
Eosinophils Absolute: 0.1 10*3/uL (ref 0.0–0.5)
Eosinophils Relative: 2 %
Immature Granulocytes: 0 %
Lymphocytes Relative: 39 %
Lymphs Abs: 2.5 10*3/uL (ref 0.7–4.0)
Monocytes Absolute: 0.4 10*3/uL (ref 0.1–1.0)
Monocytes Relative: 7 %
Neutro Abs: 3.3 10*3/uL (ref 1.7–7.7)
Neutrophils Relative %: 51 %

## 2021-03-22 LAB — APTT: aPTT: 31 seconds (ref 24–36)

## 2021-03-22 LAB — CBG MONITORING, ED: Glucose-Capillary: 123 mg/dL — ABNORMAL HIGH (ref 70–99)

## 2021-03-22 LAB — PROTIME-INR
INR: 0.9 (ref 0.8–1.2)
Prothrombin Time: 12.5 seconds (ref 11.4–15.2)

## 2021-03-22 MED ORDER — ONDANSETRON 4 MG PO TBDP: ORAL_TABLET | ORAL | 0 refills | Status: DC

## 2021-03-22 MED ORDER — MECLIZINE HCL 25 MG PO TABS
25.0000 mg | ORAL_TABLET | Freq: Three times a day (TID) | ORAL | 0 refills | Status: DC | PRN
Start: 2021-03-22 — End: 2021-04-25

## 2021-03-22 NOTE — Consult Note (Signed)
[facility] Tele[facility]urology Consult Services   Patient Name:   Marcus Andrews, Marcus Andrews Date of Birth:   [birth year] Identification Number:   MRN - [id number] Date of Service:   03/21/2021 23:51:24  Diagnosis:       G45.9 - Transient cerebral ischemic attack, unspecified       F07.2 - Postconcussional syndrome  Impression: Pt is a 61 YOM with PMH of HLD who presented with complaint of right face/leg paresthesia, fall head injury on wensday. NIHSS: 0. Deferred throbmoltycis and CTA. Get MRI BRAIN W/O and disposition depenedent on findings. If acute stroke noted, admit for further workup. If negative for stroke and/or diffuse axonal injury/microhemorrahges then discharge with outpt managment post concussive managment.    Monitor neuro checks/VS q4h with telemetry.  Recommend fall precautions.  Goal SBP b/w 100-140 afterwards.  Get MRI BRAIN W/O  Can consider meclizine/antiemetics if needed.    If stroke noted get the following:  Get CAROTID US + ECHO.  Start ASA + STATIN if no contraindications.  Get COVID, UDS, ETOH, ESR/CRP, TROP, CK, TSH, B12, BNP, LACTIC ACID, LIPID PANEL, and A1C.  Get WORKUP for TOXIC/METABOLIC/INFECTIOUS causes.  PT/OT/ST eval.    Metrics: Last Known Well: Unknown [facility] Notification Time: 03/21/2021 23:51:23 Arrival Time: 03/21/2021 23:28:00 Stamp Time: 03/21/2021 23:51:24 Initial Response Time: 03/21/2021 23:51:57 Symptoms: right face/leg paresthesia, head injury on wensday. NIHSS Start Assessment Time: 03/21/2021 23:55:48 Patient is not a candidate for Thrombolytic. Thrombolytic Medical Decision: 03/22/2021 00:15:40 Patient was not deemed candidate for Thrombolytic because of following reasons: Last Well Known Above 4.5 Hours.  CT head showed no acute hemorrhage or acute core infarct.  ED Physician notified of diagnostic impression and management plan on 03/22/2021 00:24:00  Advanced Imaging: Advanced Imaging Not Completed  because: deferred CTA   Our recommendations are outlined below.  Recommendations:        Stroke/Telemetry Floor       Neuro Checks       Bedside Swallow Eval       DVT Prophylaxis       IV Fluids, Normal Saline       Head of Bed 30 Degrees       Euglycemia and Avoid Hyperthermia (PRN Acetaminophen)   Sign Out:       Discussed with Emergency Department Provider    ------------------------------------------------------------------------------  History of Present Illness: [name].  Patient was brought by EMS for symptoms of right face/leg paresthesia, head injury on wensday. Pt is a 61 YOM with PMH of HLD who presented with complaint of right face/leg paresthesia, fall head injury on wensday. He stated he bent over for something and stood up and hit head on chair, no LOC, no fall, no BI/UI, no saddle anesthesia. He is not on any medications. He developed right face/leg parestehsia today. He had some nausea, dizziness, headache as well.   Past Medical History:      Hyperlipidemia  Medications:  No Anticoagulant use  No Antiplatelet use Reviewed EMR for current medications  Allergies:  Reviewed  Social History: Smoking: Former Alcohol Use: No Drug Use: No  Family History:  There Is Family History DU:[id number]: stroke in 82s There is no family history of premature cerebrovascular disease pertinent to this consultation  ROS : 14 Points Review of Systems was performed and was negative except mentioned in HPI.  Past Surgical History: There Is No Surgical History Contributory To Todays Visit    Examination: BP(158/97), Pulse(98), Blood Glucose(123)  1A: Level of Consciousness - Alert; keenly responsive + 0 1B: Ask Month and Age - Both Questions Right + 0 1C: Blink Eyes & Squeeze Hands - Performs Both Tasks + 0 2: Test Horizontal Extraocular Movements - Normal + 0 3: Test Visual Fields - No Visual Loss + 0 4: Test Facial Palsy (Use  Grimace if Obtunded) - Normal symmetry + 0 5A: Test Left Arm Motor Drift - No Drift for 10 Seconds + 0 5B: Test Right Arm Motor Drift - No Drift for 10 Seconds + 0 6A: Test Left Leg Motor Drift - No Drift for 5 Seconds + 0 6B: Test Right Leg Motor Drift - No Drift for 5 Seconds + 0 7: Test Limb Ataxia (FNF/Heel-Shin) - No Ataxia + 0 8: Test Sensation - Normal; No sensory loss + 0 9: Test Language/Aphasia - Normal; No aphasia + 0 10: Test Dysarthria - Normal + 0 11: Test Extinction/Inattention - No abnormality + 0  NIHSS Score: 0   Pre-Morbid Modified Rankin Scale: 0 Points = No symptoms at all   Patient/Family was informed the Neurology Consult would occur via TeleHealth consult by way of interactive audio and video telecommunications and consented to receiving care in this manner.   Patient is being evaluated for possible acute neurologic impairment and high probability of imminent or life-threatening deterioration. I spent total of 30 minutes providing care to this patient, including time for face to face visit via telemedicine, review of medical records, imaging studies and discussion of findings with providers, the patient and/or family.   Dr Currie Paris   [facility] [phone]   Case [id number]

## 2021-03-22 NOTE — ED Provider Notes (Signed)
[facility] Provider Note    Event Date/Time   First MD Initiated Contact with Patient 03/21/21 2346     (approximate)   History   Code Stroke  Level 5 caveat:  history/ROS limited by acute/critical illness  HPI  Marcus Andrews is a 61 y.o. male whose only known past medical history includes hyperlipidemia and occasional migraines.  He presents tonight for evaluation of a cute onset right facial numbness and right thigh numbness.  This started just prior to arrival and he arrived in the emergency department at approximately 11:26 PM.  The symptoms began in the setting of striking his head on a hard surface just over 48 hours ago.  He did not lose consciousness at the time, but he states that since then he has felt "dizzy and disoriented".  However the numbness is new as of the last 1+ hours.  He denies any weakness in his extremities.  No clear balance difficulties.  No difficulty speaking.  No obvious confusion.  He denies fever, sore throat, chest pain, shortness of breath, nausea, and vomiting.  He has no prior history of stroke and has never had any cardiac issues of which she is aware.  He denies drug or alcohol use.     Physical Exam   Triage Vital Signs: ED Triage Vitals  Enc Vitals Group     BP 03/21/21 2332 (!) 147/100     Pulse Rate 03/21/21 2332 98     Resp 03/21/21 2332 16     Temp 03/21/21 2332 98.4 F (36.9 C)     Temp Source 03/21/21 2332 Oral     SpO2 03/21/21 2332 96 %     Weight 03/21/21 2334 86.6 kg (191 lb)     Height 03/21/21 2334 1.829 m (6')     Head Circumference --      Peak Flow --      Pain Score 03/21/21 2339 8     Pain Loc --      Pain Edu? --      Excl. in [place]? --     Most recent vital signs: Vitals:   03/22/21 0030 03/22/21 0218  BP: (!) 138/95 129/80  Pulse: (!) 50 (!) 55  Resp: 13 16  Temp:    SpO2: 99% 98%     General: Awake, no distress but somewhat anxious.  No pain. CV:  Good peripheral perfusion.   Borderline tachycardia. Resp:  Normal effort.  No wheezing.  No accessory muscle usage. Abd:  No distention.  Other:  NIH stroke scale 1 for subjective sensory deficit.  Otherwise neurologically intact.  No obvious cranial nerve deficits.  Good muscle strength with no weakness evident in arms nor legs.  No facial droop.  No dysarthria, no aphasia, no dysmetria.   ED Results / Procedures / Treatments   Labs (all labs ordered are listed, but only abnormal results are displayed) Labs Reviewed  CBC - Abnormal; Notable for the following components:      Result Value   RDW 16.0 (*)    All other components within normal limits  COMPREHENSIVE METABOLIC PANEL - Abnormal; Notable for the following components:   Potassium 3.3 (*)    Glucose, Bld 121 (*)    Calcium 8.7 (*)    All other components within normal limits  CBG MONITORING, ED - Abnormal; Notable for the following components:   Glucose-Capillary 120 (*)    All other components within normal limits  CBG MONITORING,  ED - Abnormal; Notable for the following components:   Glucose-Capillary 123 (*)    All other components within normal limits  PROTIME-INR  APTT  DIFFERENTIAL     EKG  ED ECG REPORT I, Hinda Kehr, the attending physician, personally viewed and interpreted this ECG.  Date: 03/22/2021 EKG Time: 00: 02 Rate: 55 Rhythm: Sinus bradycardia QRS Axis: normal Intervals: normal ST/T Wave abnormalities: Non-specific ST segment / T-wave changes, but no clear evidence of acute ischemia. Narrative Interpretation: no definitive evidence of acute ischemia; does not meet STEMI criteria.    RADIOLOGY I personally reviewed the code stroke noncontrast head CT and I see no evidence of acute intracranial hemorrhage nor obvious large CVA.  I also reviewed the radiology report who reports the scan as negative.  The radiologist also called and spoke with my ED physician colleague to report a negative  finding.    PROCEDURES:  Critical Care performed: Yes, see critical care procedure note(s)  .Critical Care Performed by: Hinda Kehr, MD Authorized by: Hinda Kehr, MD   Critical care provider statement:    Critical care time (minutes):  30   Critical care time was exclusive of:  Separately billable procedures and treating other patients   Critical care was necessary to treat or prevent imminent or life-threatening deterioration of the following conditions:  CNS failure or compromise   Critical care was time spent personally by me on the following activities:  Development of treatment plan with patient or surrogate, evaluation of patient's response to treatment, examination of patient, obtaining history from patient or surrogate, ordering and performing treatments and interventions, ordering and review of laboratory studies, ordering and review of radiographic studies, pulse oximetry, re-evaluation of patient's condition and review of old charts .1-3 Lead EKG Interpretation Performed by: Hinda Kehr, MD Authorized by: Hinda Kehr, MD     Interpretation: abnormal     ECG rate:  56   ECG rate assessment: bradycardic     Rhythm: sinus bradycardia     Ectopy: none     Conduction: normal     MEDICATIONS ORDERED IN ED: Medications  sodium chloride flush (NS) 0.9 % injection 3 mL (has no administration in time range)     IMPRESSION / MDM / ASSESSMENT AND PLAN / ED COURSE  I reviewed the triage vital signs and the nursing notes.                              Differential diagnosis includes, but is not limited to, concussive symptoms, CVA, intracranial bleeding.   The patient is on the cardiac monitor to evaluate for evidence of arrhythmia and/or significant heart rate changes.  The patient was identified by nursing staff at the time of triage as a possible code stroke.  Code stroke protocol was initiated.  I saw the patient as soon as he came back from CT scan and the  process of teleneurology evaluation was already underway.  Neurology continuing with evaluation.  Patient in no severe distress at this time although he is anxious, with a headache but no chest pain, no respiratory difficulties, no indication for emergent procedure.  Will await lab and imaging results as well as specialist recommendation.  I spoke via telemedicine computer directly with Dr. Posey Pronto with the neurology service.  He does not think the patient is having a CVA.  Additionally, I personally reviewed the patient's CT head and see no evidence of acute abnormality such  as a bleed.  The radiologist called my colleague, Dr. Leonides Schanz, and confirmed that there is no evidence of acute abnormality on head CT.  I relayed this information to Dr. Posey Pronto.  After his evaluation, Dr. Posey Pronto recommended that we obtain an MRI brain to rule out a subtle traumatic injury after the patient hit his head 2 days ago, but he believes the patient is having mild concussive symptoms and can likely be discharged if the MRI is normal.  The patient and I both agree with this plan as well and we are proceeding with MR brain.    Clinical Course as of 03/22/21 0322  Sat Mar 22, 2021  0058 As part of the patient's stroke work-up, I ordered a comprehensive metabolic panel, coagulation studies, and a CBC with differential.  His initial fingerstick blood glucose was 123.  I reviewed the results of all his test.  His CBC was within normal limits, differential within normal limits, coagulation studies within normal limits, and his CMP is also within normal limits with slight exception of a slightly low calcium and a slightly low potassium of 3.3, both of which I believe are noncontributory to his current issues.  Blood glucose confirmed to be 121 on comprehensive metabolic panel. [CF]  1962 MR BRAIN WO CONTRAST [CF]  0127 MR BRAIN WO CONTRAST I personally reviewed the patient's MR brain and see no evidence of any acute abnormalities.  I  also reviewed the radiologist report that confirms normal brain MRI.  Given the patient's minimal symptoms, I will discharge him with concussion precautions as per the neurologist's recommendations. [CF]  0154 I have dated the patient about the plan and he again confirmed his agreement.  He has had no progression or worsening of his symptoms.  He understands and agrees with the plan and I gave my usual and customary return precautions.  I strongly consider admitting the patient given his initial presentation, the code stroke work-up, etc., but as previously described, the neurologist does not feel it is necessary, the patient's symptoms are very mild, there is no evidence of an acute or emergent medical condition that requires additional evaluation or treatment at this time, and both neurologist and I agree that admission would not benefit the patient.  He is appropriate for outpatient follow-up. [CF]    Clinical Course User Index [CF] Hinda Kehr, MD     FINAL CLINICAL IMPRESSION(S) / ED DIAGNOSES   Final diagnoses:  Concussion without loss of consciousness, initial encounter     Rx / DC Orders   ED Discharge Orders          Ordered    Ambulatory referral to Neurology       Comments: An appointment is requested in approximately: when possible.  Patient having what is thought to be symptoms of concussion after recent head injury.  Would benefit from neurology follow up.   03/22/21 0157    ondansetron (ZOFRAN-ODT) 4 MG disintegrating tablet        03/22/21 0157    meclizine (ANTIVERT) 25 MG tablet  3 times daily PRN        03/22/21 0157             Note:  This document was prepared using Dragon voice recognition software and may include unintentional dictation errors.   Hinda Kehr, MD 03/22/21 [phone]

## 2021-03-22 NOTE — ED Notes (Signed)
Pt to MRI

## 2021-03-22 NOTE — Discharge Instructions (Signed)
You were seen in the Emergency Department (ED) today for a head injury.  Based on your evaluation, you may have sustained a concussion (or bruise) to your brain.  Both your CT scan and MRI were reassuring with no obvious sign of injury nor stroke.  Symptoms to expect from a concussion include, but are not limited to, nausea, mild to moderate headache, difficulty concentrating or sleeping, headache, and mild lightheadedness.  These symptoms should improve over the next few days to weeks, but it may take many weeks before you feel back to normal.  Return to the emergency department or follow-up with your primary care doctor if your symptoms are not improving over this time.  Signs of a more serious head injury include vomiting, severe or worsening headache, excessive sleepiness or confusion, and weakness or numbness in your face, arms or legs.  Return immediately to the Emergency Department if you experience any of these more concerning symptoms.    Rest, avoid strenuous physical or mental activity, and avoid activities that could potentially result in another head injury until all your symptoms from this head injury are completely resolved for at least 2-3 weeks.  If you participate in sports, get cleared by your doctor or trainer before returning to play.  You may take ibuprofen or acetaminophen over the counter according to label instructions for mild headache or scalp soreness.

## 2021-03-22 NOTE — ED Notes (Signed)
Pt returned from MRI °

## 2021-03-28 DIAGNOSIS — K5792 Diverticulitis of intestine, part unspecified, without perforation or abscess without bleeding: Secondary | ICD-10-CM | POA: Diagnosis not present

## 2021-04-11 DIAGNOSIS — R1030 Lower abdominal pain, unspecified: Secondary | ICD-10-CM | POA: Diagnosis not present

## 2021-04-25 ENCOUNTER — Encounter: Payer: Self-pay | Admitting: Gastroenterology

## 2021-04-25 ENCOUNTER — Ambulatory Visit: Payer: 59 | Admitting: Gastroenterology

## 2021-04-25 VITALS — BP 126/82 | HR 60 | Ht 72.0 in | Wt 187.8 lb

## 2021-04-25 DIAGNOSIS — R140 Abdominal distension (gaseous): Secondary | ICD-10-CM

## 2021-04-25 DIAGNOSIS — R1084 Generalized abdominal pain: Secondary | ICD-10-CM

## 2021-04-25 DIAGNOSIS — K219 Gastro-esophageal reflux disease without esophagitis: Secondary | ICD-10-CM | POA: Diagnosis not present

## 2021-04-25 DIAGNOSIS — K5732 Diverticulitis of large intestine without perforation or abscess without bleeding: Secondary | ICD-10-CM

## 2021-04-25 MED ORDER — GLYCOPYRROLATE 1 MG PO TABS: 1.0000 mg | ORAL_TABLET | Freq: Two times a day (BID) | ORAL | 11 refills | Status: DC

## 2021-04-25 NOTE — Progress Notes (Signed)
° ° °  History of Present Illness: This is a 61 year old male who relates chronic bloating, generalized abdominal pain, intermittent loose stools.  He was recently treated with a course of Augmentin for acute diverticulitis and those symptoms have resolved.  He notes episodes of regurgitation and reflux occurring about twice weekly.  He generally has 3 bowel movements daily. CT AP in April 2021 showed focal descending colon diverticulitis without other GI abnormalities.  Colonoscopy 08/2019 - One 5 mm polyp in the descending colon, removed with a cold snare. Resected and retrieved - One 12 mm polyp in the sigmoid colon, removed with a hot snare. Resected and retrieved. - Moderate diverticulosis in the left colon. - Internal hemorrhoids. - The examination was otherwise normal on direct and retroflexion views. Path: hyperplastic  Current Medications, Allergies, Past Medical History, Past Surgical History, Family History and Social History were reviewed in [employer] record.   Physical Exam: General: Well developed, well nourished, no acute distress Head: Normocephalic and atraumatic Eyes: Sclerae anicteric, EOMI Ears: Normal auditory acuity Mouth: Not examined, mask on during Covid-19 pandemic Lungs: Clear throughout to auscultation Heart: Regular rate and rhythm; no murmurs, rubs or bruits Abdomen: Soft, non tender and non distended. No masses, hepatosplenomegaly or hernias noted. Normal Bowel sounds Rectal: Not done Musculoskeletal: Symmetrical with no gross deformities  Pulses:  Normal pulses noted Extremities: No clubbing, cyanosis, edema or deformities noted Neurological: Alert oriented x 4, grossly nonfocal Psychological:  Alert and cooperative. Normal mood and affect   Assessment and Recommendations:  Generalized abdominal pain, abdominal bloating, GERD.  Suspected IBS.  Rule out ulcer disease, gastritis, esophagitis, Barrett's.  Begin glycopyrrolate 1 mg  p.o. twice daily.  Pepcid AC twice daily as needed for now.  Schedule EGD. The risks (including bleeding, perforation, infection, missed lesions, medication reactions and possible hospitalization or surgery if complications occur), benefits, and alternatives to endoscopy with possible biopsy and possible dilation were discussed with the patient and they consent to proceed.  If abdominal pain persist consider repeating CT AP.  History of recurrent diverticulitis recently treated with Augmentin and resolved.  High-fiber diet with adequate daily fluid intake. [place]. Colonoscopy recommended in June 2026

## 2021-04-25 NOTE — Patient Instructions (Addendum)
You can take over the counter Pepcid AC as needed.   Also, take Gas-x four times a day for gas and bloating.   We have sent the following medications to your pharmacy for you to pick up at your convenience: glycopyrrolate.   You have been scheduled for an endoscopy. Please follow written instructions given to you at your visit today. If you use inhalers (even only as needed), please bring them with you on the day of your procedure.  Due to recent changes in healthcare laws, you may see the results of your imaging and laboratory studies on MyChart before your provider has had a chance to review them.  We understand that in some cases there may be results that are confusing or concerning to you. Not all laboratory results come back in the same time frame and the provider may be waiting for multiple results in order to interpret others.  Please give [place] 48 hours in order for your provider to thoroughly review all the results before contacting the office for clarification of your results.   The [facility] providers would like to encourage you to use [facility] to communicate with providers for non-urgent requests or questions.  Due to long hold times on the telephone, sending your provider a message by [facility] may be a faster and more efficient way to get a response.  Please allow 48 business hours for a response.  Please remember that this is for non-urgent requests.   Thank you for choosing me and [facility].  Pricilla Riffle. Dagoberto Ligas., MD., Marval Regal

## 2021-05-22 ENCOUNTER — Ambulatory Visit (INDEPENDENT_AMBULATORY_CARE_PROVIDER_SITE_OTHER)
Admission: RE | Admit: 2021-05-22 | Discharge: 2021-05-22 | Disposition: A | Discharge status: Home / Self Care | Payer: 59 | Source: Ambulatory Visit | Attending: Family Medicine | Admitting: Family Medicine

## 2021-05-22 ENCOUNTER — Encounter: Payer: Self-pay | Admitting: Family Medicine

## 2021-05-22 ENCOUNTER — Other Ambulatory Visit: Payer: Self-pay

## 2021-05-22 ENCOUNTER — Ambulatory Visit (INDEPENDENT_AMBULATORY_CARE_PROVIDER_SITE_OTHER): Payer: 59 | Admitting: Family Medicine

## 2021-05-22 VITALS — BP 122/82 | HR 72 | Temp 98.0°F | Resp 14 | Ht 72.0 in | Wt 189.4 lb

## 2021-05-22 DIAGNOSIS — Z125 Encounter for screening for malignant neoplasm of prostate: Secondary | ICD-10-CM | POA: Diagnosis not present

## 2021-05-22 DIAGNOSIS — Z13220 Encounter for screening for lipoid disorders: Secondary | ICD-10-CM

## 2021-05-22 DIAGNOSIS — R7309 Other abnormal glucose: Secondary | ICD-10-CM

## 2021-05-22 DIAGNOSIS — M5137 Other intervertebral disc degeneration, lumbosacral region: Secondary | ICD-10-CM | POA: Diagnosis not present

## 2021-05-22 DIAGNOSIS — M545 Low back pain, unspecified: Secondary | ICD-10-CM

## 2021-05-22 DIAGNOSIS — Z113 Encounter for screening for infections with a predominantly sexual mode of transmission: Secondary | ICD-10-CM | POA: Diagnosis not present

## 2021-05-22 DIAGNOSIS — R69 Illness, unspecified: Secondary | ICD-10-CM | POA: Diagnosis not present

## 2021-05-22 DIAGNOSIS — N451 Epididymitis: Secondary | ICD-10-CM | POA: Diagnosis not present

## 2021-05-22 DIAGNOSIS — M2578 Osteophyte, vertebrae: Secondary | ICD-10-CM | POA: Diagnosis not present

## 2021-05-22 LAB — LIPID PANEL
Cholesterol: 256 mg/dL — ABNORMAL HIGH (ref 0–200)
HDL: 77.4 mg/dL (ref >39.00)
LDL Cholesterol: 158 mg/dL — ABNORMAL HIGH (ref 0–99)
NonHDL: 178.61
Total CHOL/HDL Ratio: 3
Triglycerides: 104 mg/dL (ref 0.0–149.0)
VLDL: 20.8 mg/dL (ref 0.0–40.0)

## 2021-05-22 LAB — PSA: PSA: 0.7 ng/mL (ref 0.10–4.00)

## 2021-05-22 LAB — HEMOGLOBIN A1C: Hgb A1c MFr Bld: 6.2 % (ref 4.6–6.5)

## 2021-05-22 NOTE — Progress Notes (Signed)
? ?Subjective:  ? ?  ?Marcus Andrews is a 61 y.o. male presenting for Establish Care (Pt would like to discuss about getting tested for prostate cancer states his testicles feel weird to the touch ongoing for couple months, no fam hx of cancer.//C/o back pain hurt it couple weeks ago was prescribed prednisone w/o relief. Still c/o spasms and pain 5/10 ) ?  ? ? ?HPI ? ?#testicular pain ?- no urination issue ?- no blood in urine ?- no ejaculation issues ?- not sure if blood in semen ?- no ED ?- a few months ?- will get pain in the testicles - sensitive to touch ?- no lumps or lesions ?- no fevers ?- endorses years of hot flashes - had thyroid checked which was fine ?- endorses night sweats as well ? ?#Back pain ?- went UC  ?- completed prednisone - felt like his joint started hurting ?- it did help a little with the back  ?- using flexeril at night w/ help but still having spasms during the day ?- continues to take ibuprofen 600 mg prn ?- he tries to not take anything for the pain as much as possible ?-  ? ?05/09/2021: UC - Back pain - flexeril and prednisone 21 days ? ?Review of Systems ? ? ?Social History  ? ?Tobacco Use  ?Smoking Status Former  ? Packs/day: 0.50  ? Years: 38.00  ? Pack years: 19.00  ? Types: Cigarettes  ? Quit date: 05/28/1998  ? Years since quitting: 23.0  ?Smokeless Tobacco Never  ? ? ? ?   ?Objective:  ?  ?BP Readings from Last 3 Encounters:  ?05/22/21 122/82  ?04/25/21 126/82  ?03/22/21 129/80  ? ?Wt Readings from Last 3 Encounters:  ?05/22/21 189 lb 6.4 oz (85.9 kg)  ?04/25/21 187 lb 12.8 oz (85.2 kg)  ?03/21/21 190 lb 14.7 oz (86.6 kg)  ? ? ?BP 122/82 (BP Location: Left Arm, Patient Position: Sitting, Cuff Size: Large)   Pulse 72   Temp 98 ?F (36.7 ?C) (Oral)   Resp 14   Ht 6' (1.829 m)   Wt 189 lb 6.4 oz (85.9 kg)   SpO2 97%   BMI 25.69 kg/m?  ? ? ?Physical Exam ?Exam conducted with a chaperone present.  ?Constitutional:   ?   Appearance: Normal appearance. He is not ill-appearing or  diaphoretic.  ?HENT:  ?   Right Ear: External ear normal.  ?   Left Ear: External ear normal.  ?Eyes:  ?   General: No scleral icterus. ?   Extraocular Movements: Extraocular movements intact.  ?   Conjunctiva/sclera: Conjunctivae normal.  ?Cardiovascular:  ?   Rate and Rhythm: Normal rate.  ?Pulmonary:  ?   Effort: Pulmonary effort is normal.  ?Genitourinary: ?   Pubic Area: No rash.   ?   Penis: Normal.   ?   Testes:     ?   Right: Tenderness present. Mass not present.     ?   Left: Mass, tenderness or swelling not present.  ?   Epididymis:  ?   Right: Enlarged. Tenderness present.  ?   Left: No tenderness.  ?Musculoskeletal:  ?   Cervical back: Neck supple.  ?   Comments: Back ?Inspection: no step off, no abnormality ?Palpation: TTP along the lumbar spine, no paraspinous ttp ?ROM: normal ?Strength:normal ?Straight leg raise - notes anterior thigh pain  ?Skin: ?   General: Skin is warm and dry.  ?Neurological:  ?   Mental  Status: He is alert. Mental status is at baseline.  ?Psychiatric:     ?   Mood and Affect: Mood normal.  ? ? ? ? ? ?   ?Assessment & Plan:  ? ?Problem List Items Addressed This Visit   ? ?  ? Genitourinary  ? Epididymitis - Primary  ?  Most recent sexual encounter was in December per patient report, discussed given length of symptoms will test for STIs and consider treatment with levofloxacin if STI testing is negative.  If no improvement will plan urology referral and/or ultrasound. ?  ?  ? Relevant Orders  ? Urine Culture  ? POCT urinalysis dipstick  ? C. trachomatis/N. gonorrhoeae RNA  ?  ? Other  ? Acute midline low back pain without sciatica  ?  He has completed a course of steroids and also has Flexeril but no resolution in pain.  Does have spinal tenderness will get x-ray to rule out other causes.  At this point advised course of physical therapy and return if not improving. ?  ?  ? Relevant Orders  ? DG Lumbar Spine Complete  ? Ambulatory referral to Physical Therapy  ? ?Other Visit  Diagnoses   ? ? Elevated glucose      ? Relevant Orders  ? Hemoglobin A1c  ? Screening for prostate cancer      ? Relevant Orders  ? PSA  ? Screening for hyperlipidemia      ? Relevant Orders  ? Lipid panel  ? Routine screening for STI (sexually transmitted infection)      ? Relevant Orders  ? HIV Antibody (routine testing w rflx)  ? Hepatitis C antibody  ? RPR  ? ?  ? ? ? ?Return if symptoms worsen or fail to improve. ? ?Lesleigh Noe, MD ? ?This visit occurred during the SARS-CoV-2 public health emergency.  Safety protocols were in place, including screening questions prior to the visit, additional usage of staff PPE, and extensive cleaning of exam room while observing appropriate contact time as indicated for disinfecting solutions.  ? ?

## 2021-05-22 NOTE — Patient Instructions (Addendum)
Back pain ?- referral to physical therapy ? ?Testicular pain ?- labs today ?- likely antibiotics pending work-up ?- update if not improving ?

## 2021-05-22 NOTE — Assessment & Plan Note (Signed)
Most recent sexual encounter was in December per patient report, discussed given length of symptoms will test for STIs and consider treatment with levofloxacin if STI testing is negative.  If no improvement will plan urology referral and/or ultrasound. ?

## 2021-05-22 NOTE — Assessment & Plan Note (Signed)
He has completed a course of steroids and also has Flexeril but no resolution in pain.  Does have spinal tenderness will get x-ray to rule out other causes.  At this point advised course of physical therapy and return if not improving. ?

## 2021-05-23 ENCOUNTER — Encounter: Payer: Self-pay | Admitting: Family Medicine

## 2021-05-23 DIAGNOSIS — R7303 Prediabetes: Secondary | ICD-10-CM | POA: Insufficient documentation

## 2021-05-23 DIAGNOSIS — E785 Hyperlipidemia, unspecified: Secondary | ICD-10-CM | POA: Insufficient documentation

## 2021-05-23 LAB — URINE CULTURE
MICRO NUMBER:: 13109968
Result:: NO GROWTH
SPECIMEN QUALITY:: ADEQUATE

## 2021-05-23 LAB — RPR: RPR Ser Ql: NONREACTIVE

## 2021-05-23 LAB — HEPATITIS C ANTIBODY
Hepatitis C Ab: NONREACTIVE
SIGNAL TO CUT-OFF: 0.03 (ref <1.00)

## 2021-05-23 LAB — HIV ANTIBODY (ROUTINE TESTING W REFLEX): HIV 1&2 Ab, 4th Generation: NONREACTIVE

## 2021-05-23 LAB — C. TRACHOMATIS/N. GONORRHOEAE RNA
C. trachomatis RNA, TMA: NOT DETECTED
N. gonorrhoeae RNA, TMA: NOT DETECTED

## 2021-05-26 ENCOUNTER — Other Ambulatory Visit: Payer: Self-pay | Admitting: Family Medicine

## 2021-05-26 DIAGNOSIS — N451 Epididymitis: Secondary | ICD-10-CM

## 2021-05-26 MED ORDER — LEVOFLOXACIN 500 MG PO TABS: 500.0000 mg | ORAL_TABLET | Freq: Every day | ORAL | 0 refills | Status: AC

## 2021-05-27 NOTE — Progress Notes (Signed)
Spoke to pt and relayed message. Pt stated understanding.  

## 2021-05-29 ENCOUNTER — Ambulatory Visit (AMBULATORY_SURGERY_CENTER): Payer: 59 | Admitting: Gastroenterology

## 2021-05-29 ENCOUNTER — Other Ambulatory Visit: Payer: Self-pay | Admitting: Gastroenterology

## 2021-05-29 ENCOUNTER — Encounter: Payer: Self-pay | Admitting: Gastroenterology

## 2021-05-29 ENCOUNTER — Other Ambulatory Visit: Payer: Self-pay

## 2021-05-29 VITALS — BP 125/98 | HR 63 | Temp 96.2°F | Resp 10 | Ht 72.0 in | Wt 187.0 lb

## 2021-05-29 DIAGNOSIS — K2210 Ulcer of esophagus without bleeding: Secondary | ICD-10-CM | POA: Diagnosis not present

## 2021-05-29 DIAGNOSIS — K219 Gastro-esophageal reflux disease without esophagitis: Secondary | ICD-10-CM | POA: Diagnosis not present

## 2021-05-29 DIAGNOSIS — R1084 Generalized abdominal pain: Secondary | ICD-10-CM

## 2021-05-29 DIAGNOSIS — R140 Abdominal distension (gaseous): Secondary | ICD-10-CM | POA: Diagnosis not present

## 2021-05-29 DIAGNOSIS — K259 Gastric ulcer, unspecified as acute or chronic, without hemorrhage or perforation: Secondary | ICD-10-CM | POA: Diagnosis not present

## 2021-05-29 DIAGNOSIS — K2100 Gastro-esophageal reflux disease with esophagitis, without bleeding: Secondary | ICD-10-CM

## 2021-05-29 MED ORDER — PANTOPRAZOLE SODIUM 40 MG PO TBEC: 40.0000 mg | DELAYED_RELEASE_TABLET | Freq: Every day | ORAL | 4 refills | Status: DC

## 2021-05-29 MED ORDER — SODIUM CHLORIDE 0.9 % IV SOLN: 500.0000 mL | Freq: Once | INTRAVENOUS | Status: DC

## 2021-05-29 MED ORDER — GLYCOPYRROLATE 1 MG PO TABS: 1.0000 mg | ORAL_TABLET | Freq: Two times a day (BID) | ORAL | 11 refills | Status: DC

## 2021-05-29 NOTE — Progress Notes (Signed)
Pt's states no medical or surgical changes since previsit or office visit.  Vitals DT 

## 2021-05-29 NOTE — Progress Notes (Signed)
Called to room to assist during endoscopic procedure.  Patient ID and intended procedure confirmed with present staff. Received instructions for my participation in the procedure from the performing physician.  

## 2021-05-29 NOTE — Progress Notes (Signed)
Report to PACU, RN, vss, BBS= Clear.  

## 2021-05-29 NOTE — Progress Notes (Signed)
? ?History & Physical ? ?Primary Care Physician:  Lesleigh Noe, MD ?Primary Gastroenterologist: Lucio Edward, MD ? ?CHIEF COMPLAINT:   Abdominal pain and GERD ? ?HPI: Marcus Andrews is a 61 y.o. male with abdominal bloating, generalized abdominal pain and GERD for EGD.  ? ? ?Past Medical History:  ?Diagnosis Date  ? Arthritis   ? Colon polyp   ? Diverticulosis   ? Refusal of blood transfusions as patient is Jehovah's Witness   ? ? ?Past Surgical History:  ?Procedure Laterality Date  ? COLONOSCOPY    ? KNEE ARTHROSCOPY WITH MEDIAL MENISECTOMY Right 02/05/2020  ? Procedure: Right knee arthroscopy with partial medial and lateral menisectomy, chondroplasty, partial synovectomy, removal loose body;  Surgeon: Lovell Sheehan, MD;  Location: [facility] ORS;  Service: Orthopedics;  Laterality: Right;  ? MENISCUS REPAIR Left 01/2014  ? TOOTH EXTRACTION    ? UPPER GASTROINTESTINAL ENDOSCOPY  05/29/2021  ? ? ?Prior to Admission medications   ?Medication Sig Start Date End Date Taking? Authorizing Provider  ?cyclobenzaprine (FLEXERIL) 10 MG tablet Take 10 mg by mouth at bedtime. 05/09/21   [provider]  ?ibuprofen (ADVIL) 600 MG tablet Take 600 mg by mouth every 6 (six) hours as needed. 04/24/21   [provider]  ?levofloxacin (LEVAQUIN) 500 MG tablet Take 1 tablet (500 mg total) by mouth daily for 10 days. ?Patient not taking: Reported on 05/29/2021 05/26/21 06/05/21  Lesleigh Noe, MD  ? ? ?Current Outpatient Medications  ?Medication Sig Dispense Refill  ? cyclobenzaprine (FLEXERIL) 10 MG tablet Take 10 mg by mouth at bedtime.    ? ibuprofen (ADVIL) 600 MG tablet Take 600 mg by mouth every 6 (six) hours as needed.    ? levofloxacin (LEVAQUIN) 500 MG tablet Take 1 tablet (500 mg total) by mouth daily for 10 days. (Patient not taking: Reported on 05/29/2021) 10 tablet 0  ? ?Current Facility-Administered Medications  ?Medication Dose Route Frequency Provider Last Rate Last Admin  ? 0.9 %  sodium chloride  infusion  500 mL Intravenous Once Ladene Artist, MD      ? ? ?Allergies as of 05/29/2021  ? (No Known Allergies)  ? ? ?Family History  ?Problem Relation Age of Onset  ? Breast cancer Mother   ? Colon cancer Mother 22  ? Colon polyps Mother   ? Heart disease Father   ? Heart failure Father   ? Heart attack Father 29  ? Hypertension Sister   ? Hyperlipidemia Sister   ? Asthma Sister   ? Hypertension Brother   ? Hyperlipidemia Brother   ? Deep vein thrombosis Brother   ? Hypertension Maternal Aunt   ? Cancer Maternal Aunt   ?     type unknown  ? Hypertension Maternal Uncle   ? Liver cancer Maternal Uncle   ? Hypertension Paternal Aunt   ? Hypertension Paternal Uncle   ? Hypertension Maternal Grandmother   ? Diabetes Maternal Grandmother   ? Esophageal cancer Neg Hx   ? Rectal cancer Neg Hx   ? Stomach cancer Neg Hx   ? ? ?Social History  ? ?Socioeconomic History  ? Marital status: Single  ?  Spouse name: Not on file  ? Number of children: 0  ? Years of education: Not on file  ? Highest education level: Not on file  ?Occupational History  ? Occupation: owner/operator  ?Tobacco Use  ? Smoking status: Former  ?  Packs/day: 0.50  ?  Years: 38.00  ?  Pack years: 19.00  ?  Types: Cigarettes  ?  Quit date: 05/28/1998  ?  Years since quitting: 23.0  ? Smokeless tobacco: Never  ?Vaping Use  ? Vaping Use: Never used  ?Substance and Sexual Activity  ? Alcohol use: Not Currently  ?  Alcohol/week: 1.0 standard drink  ?  Types: 1 Cans of beer per week  ?  Comment: a few times a year  ? Drug use: Never  ? Sexual activity: Not Currently  ?  Birth control/protection: Condom  ?Other Topics Concern  ? Not on file  ?Social History Narrative  ? 05/22/21  ? From: the area  ? Living: alone  ? Work: Administrator  ?   ? Family: family is all over the place, has cousins which are local in [place]  ?   ? Enjoys: learning to play guitar, DJ, music, aviation  ?   ? Exercise: knee/back pain limit this  ? Diet: could be better  - avoids red meats/pork  ?   ? Safety  ? Seat belts: Yes   ? Guns: No  ? Safe in relationships: Yes   ?   ? ?Social Determinants of Health  ? ?Financial Resource Strain: Not on file  ?Food Insecurity: Not on file  ?Transportation Needs: Not on file  ?Physical Activity: Not on file  ?Stress: Not on file  ?Social Connections: Not on file  ?Intimate Partner Violence: Not on file  ? ? ?Review of Systems: ? ?All systems reviewed an negative except where noted in HPI. ? ?Gen: Denies any fever, chills, sweats, anorexia, fatigue, weakness, malaise, weight loss, and sleep disorder ?CV: Denies chest pain, angina, palpitations, syncope, orthopnea, PND, peripheral edema, and claudication. ?Resp: Denies dyspnea at rest, dyspnea with exercise, cough, sputum, wheezing, coughing up blood, and pleurisy. ?GI: Denies vomiting blood, jaundice, and fecal incontinence.   Denies dysphagia or odynophagia. ?GU : Denies urinary burning, blood in urine, urinary frequency, urinary hesitancy, nocturnal urination, and urinary incontinence. ?MS: Denies joint pain, limitation of movement, and swelling, stiffness, low back pain, extremity pain. Denies muscle weakness, cramps, atrophy.  ?Derm: Denies rash, itching, dry skin, hives, moles, warts, or unhealing ulcers.  ?Psych: Denies depression, anxiety, memory loss, suicidal ideation, hallucinations, paranoia, and confusion. ?Heme: Denies bruising, bleeding, and enlarged lymph nodes. ?Neuro:  Denies any headaches, dizziness, paresthesias. ?Endo:  Denies any problems with DM, thyroid, adrenal function. ? ? ?Physical Exam: ?General:  Alert, well-developed, in NAD ?Head:  Normocephalic and atraumatic. ?Eyes:  Sclera clear, no icterus.   Conjunctiva pink. ?Ears:  Normal auditory acuity. ?Mouth:  No deformity or lesions.  ?Neck:  Supple; no masses . ?Lungs:  Clear throughout to auscultation.   No wheezes, crackles, or rhonchi. No acute distress. ?Heart:  Regular rate and rhythm; no murmurs. ?Abdomen:   Soft, nondistended, nontender. No masses, hepatomegaly. No obvious masses.  Normal bowel .    ?Rectal:  Deferred   ?Msk:  Symmetrical without gross deformities.Marland Kitchen ?Pulses:  Normal pulses noted. ?Extremities:  Without edema. ?Neurologic:  Alert and  oriented x4;  grossly normal neurologically. ?Skin:  Intact without significant lesions or rashes. ?Cervical Nodes:  No significant cervical adenopathy. ?Psych:  Alert and cooperative. Normal mood and affect. ? ? ?Impression / Plan:  ? ?Abdominal bloating, generalized abdominal pain and GERD for EGD.  ? ?Maimuna Leaman T. Fuller Plan  05/29/2021, 9:45 AM ?See Shea Evans, [facility], to contact our on call provider ? ? ?  ?

## 2021-05-29 NOTE — Op Note (Signed)
Marcus Andrews ?Patient Name: Marcus Andrews ?Procedure Date: 05/29/2021 9:41 AM ?MRN: [id number] ?Endoscopist: Ladene Artist , MD ?Age: 61 ?Referring MD:  ?Date of Birth: [birth year] ?Gender: Male ?Account #: [id number] ?Procedure:                Upper GI endoscopy ?Indications:              Generalized abdominal pain, Gastroesophageal reflux  ?                          disease ?Medicines:                Monitored Anesthesia Care ?Procedure:                Pre-Anesthesia Assessment: ?                          - Prior to the procedure, a History and Physical  ?                          was performed, and patient medications and  ?                          allergies were reviewed. The patient's tolerance of  ?                          previous anesthesia was also reviewed. The risks  ?                          and benefits of the procedure and the sedation  ?                          options and risks were discussed with the patient.  ?                          All questions were answered, and informed consent  ?                          was obtained. Prior Anticoagulants: The patient has  ?                          taken no previous anticoagulant or antiplatelet  ?                          agents. ASA Grade Assessment: II - A patient with  ?                          mild systemic disease. After reviewing the risks  ?                          and benefits, the patient was deemed in  ?                          satisfactory condition to undergo the procedure. ?  After obtaining informed consent, the endoscope was  ?                          passed under direct vision. Throughout the  ?                          procedure, the patient's blood pressure, pulse, and  ?                          oxygen saturations were monitored continuously. The  ?                          Endoscope was introduced through the mouth, and  ?                          advanced to the second part of duodenum. The  upper  ?                          GI endoscopy was accomplished without difficulty.  ?                          The patient tolerated the procedure well. ?Scope In: ?Scope Out: ?Findings:                 LA Grade A (one or more mucosal breaks less than 5  ?                          mm, not extending between tops of 2 mucosal folds)  ?                          esophagitis with no bleeding was found at the  ?                          gastroesophageal junction. ?                          The exam of the esophagus was otherwise normal. ?                          Multiple dispersed small erosions with no bleeding  ?                          and no stigmata of recent bleeding were found in  ?                          the gastric antrum. Biopsies were taken with a cold  ?                          forceps for histology. ?                          The exam of the stomach was otherwise normal. ?  The duodenal bulb and second portion of the  ?                          duodenum were normal. ?Complications:            No immediate complications. ?Estimated Blood Loss:     Estimated blood loss was minimal. ?Impression:               - LA Grade A reflux esophagitis with no bleeding. ?                          - Erosive gastropathy with no bleeding and no  ?                          stigmata of recent bleeding. Biopsied. ?                          - Normal duodenal bulb and second portion of the  ?                          duodenum. ?Recommendation:           - Patient has a contact number available for  ?                          emergencies. The signs and symptoms of potential  ?                          delayed complications were discussed with the  ?                          patient. Return to normal activities tomorrow.  ?                          Written discharge instructions were provided to the  ?                          patient. ?                          - Resume previous diet. ?                           - Follow antireflux measures. ?                          - Continue present medications. ?                          - Protonix (pantoprazole) 40 mg PO daily, 1 year of  ?                          refills. ?                          - Return to GI office in 6 weeks. ?Ladene Artist, MD ?05/29/2021 10:03:26 AM ?This report has been signed  electronically. ?

## 2021-05-29 NOTE — Patient Instructions (Signed)
Discharge instructions given. ?Handouts on Gastritis and esophagitis. ?Prescription sent to pharmacy. ?Resume previous medications. ?YOU HAD AN ENDOSCOPIC PROCEDURE TODAY AT [facility]:   Refer to the procedure report that was given to you for any specific questions about what was found during the examination.  If the procedure report does not answer your questions, please call your gastroenterologist to clarify.  If you requested that your care partner not be given the details of your procedure findings, then the procedure report has been included in a sealed envelope for you to review at your convenience later. ? ?YOU SHOULD EXPECT: Some feelings of bloating in the abdomen. Passage of more gas than usual.  Walking can help get rid of the air that was put into your GI tract during the procedure and reduce the bloating. If you had a lower endoscopy (such as a colonoscopy or flexible sigmoidoscopy) you may notice spotting of blood in your stool or on the toilet paper. If you underwent a bowel prep for your procedure, you may not have a normal bowel movement for a few days. ? ?Please Note:  You might notice some irritation and congestion in your nose or some drainage.  This is from the oxygen used during your procedure.  There is no need for concern and it should clear up in a day or so. ? ?SYMPTOMS TO REPORT IMMEDIATELY: ? ?Following upper endoscopy (EGD) ? Vomiting of blood or coffee ground material ? New chest pain or pain under the shoulder blades ? Painful or persistently difficult swallowing ? New shortness of breath ? Fever of 100?F or higher ? Black, tarry-looking stools ? ?For urgent or emergent issues, a gastroenterologist can be reached at any hour by calling [phone]. ?Do not use MyChart messaging for urgent concerns.  ? ? ?DIET:  We do recommend a small meal at first, but then you may proceed to your regular diet.  Drink plenty of fluids but you should avoid alcoholic beverages for  24 hours. ? ?ACTIVITY:  You should plan to take it easy for the rest of today and you should NOT DRIVE or use heavy machinery until tomorrow (because of the sedation medicines used during the test).   ? ?FOLLOW UP: ?Our staff will call the number listed on your records 48-72 hours following your procedure to check on you and address any questions or concerns that you may have regarding the information given to you following your procedure. If we do not reach you, we will leave a message.  We will attempt to reach you two times.  During this call, we will ask if you have developed any symptoms of COVID 19. If you develop any symptoms (ie: fever, flu-like symptoms, shortness of breath, cough etc.) before then, please call [phone].  If you test positive for Covid 19 in the 2 weeks post procedure, please call and report this information to [place].   ? ?If any biopsies were taken you will be contacted by phone or by letter within the next 1-3 weeks.  Please call us at [phone] if you have not heard about the biopsies in 3 weeks.  ? ? ?SIGNATURES/CONFIDENTIALITY: ?You and/or your care partner have signed paperwork which will be entered into your electronic medical record.  These signatures attest to the fact that that the information above on your After Visit Summary has been reviewed and is understood.  Full responsibility of the confidentiality of this discharge information lies with you and/or your care-partner.  ?

## 2021-05-30 ENCOUNTER — Telehealth: Payer: Self-pay

## 2021-05-30 NOTE — Telephone Encounter (Signed)
Patient has been scheduled for his 6 week follow up with Dr. Fuller Plan on 5/3 at 9:10 am. Patient is aware.  ?

## 2021-06-02 ENCOUNTER — Telehealth: Payer: Self-pay

## 2021-06-02 NOTE — Telephone Encounter (Signed)
Left message on answering machine. 

## 2021-06-04 ENCOUNTER — Telehealth: Payer: Self-pay | Admitting: Family Medicine

## 2021-06-04 NOTE — Telephone Encounter (Signed)
Pt called stating that Dr Einar Pheasant stated that if medication levofloxacin (LEVAQUIN) 500 MG tablet is not working in 5 days to let her know. Pt states that the medication is not working. Pt is also asking if he can get something called in for his knee and back pain. Please advise. ?

## 2021-06-05 NOTE — Telephone Encounter (Signed)
He was advised to follow-up if symptoms are not improved.  ? ?Would recommend office visit to discuss additional work-up as well as treatment.  ?

## 2021-06-05 NOTE — Telephone Encounter (Signed)
Pt called to find out what Dr Einar Pheasant wanted to do. I made him an appt for tomorrow. ?

## 2021-06-06 ENCOUNTER — Other Ambulatory Visit: Payer: Self-pay

## 2021-06-06 ENCOUNTER — Ambulatory Visit (INDEPENDENT_AMBULATORY_CARE_PROVIDER_SITE_OTHER): Payer: 59 | Admitting: Family Medicine

## 2021-06-06 VITALS — BP 110/70 | HR 65 | Temp 98.2°F | Ht 72.0 in | Wt 190.1 lb

## 2021-06-06 DIAGNOSIS — N50811 Right testicular pain: Secondary | ICD-10-CM | POA: Diagnosis not present

## 2021-06-06 DIAGNOSIS — G8929 Other chronic pain: Secondary | ICD-10-CM

## 2021-06-06 DIAGNOSIS — M25561 Pain in right knee: Secondary | ICD-10-CM

## 2021-06-06 DIAGNOSIS — M25562 Pain in left knee: Secondary | ICD-10-CM

## 2021-06-06 DIAGNOSIS — M545 Low back pain, unspecified: Secondary | ICD-10-CM | POA: Diagnosis not present

## 2021-06-06 MED ORDER — MELOXICAM 7.5 MG PO TABS: 7.5000 mg | ORAL_TABLET | Freq: Every day | ORAL | 0 refills | Status: DC

## 2021-06-06 MED ORDER — TRAMADOL HCL 50 MG PO TABS
50.0000 mg | ORAL_TABLET | Freq: Three times a day (TID) | ORAL | 0 refills | Status: AC | PRN
Start: 2021-06-06 — End: 2021-06-11

## 2021-06-06 NOTE — Progress Notes (Signed)
? ?Subjective:  ? ?  ?Marcus Andrews is a 61 y.o. male presenting for Follow-up ?  ? ? ?HPI ? ?#Testicular pain ?- persisting ?- worsening with left now ? ?#Back pain ?- completed steroids ?- has cyclobenzaprine at nighttime ?- during the day still having pain ?- only taking ibuprofen occasionally  ?- feels ibuprofen is ineffective ?- has referral to physical therapy - has not been able to schedule due to busy week ?- has handout for pain and when he does exercises his pains worsens ? ? ?#Bilateral knee pain ?- hx of 2 knee operations and arthritis ?- worse with walking ?- tries to do bike ?- saw ortho previously and was discussing knee replacement ?-  ? ?Review of Systems ? ? ?Social History  ? ?Tobacco Use  ?Smoking Status Former  ? Packs/day: 0.50  ? Years: 38.00  ? Pack years: 19.00  ? Types: Cigarettes  ? Quit date: 05/28/1998  ? Years since quitting: 23.0  ?Smokeless Tobacco Never  ? ? ? ?   ?Objective:  ?  ?BP Readings from Last 3 Encounters:  ?06/06/21 110/70  ?05/29/21 (!) 125/98  ?05/22/21 122/82  ? ?Wt Readings from Last 3 Encounters:  ?06/06/21 190 lb 2 oz (86.2 kg)  ?05/29/21 187 lb (84.8 kg)  ?05/22/21 189 lb 6.4 oz (85.9 kg)  ? ? ?BP 110/70   Pulse 65   Temp 98.2 ?F (36.8 ?C) (Oral)   Ht 6' (1.829 m)   Wt 190 lb 2 oz (86.2 kg)   SpO2 97%   BMI 25.79 kg/m?  ? ? ?Physical Exam ?Constitutional:   ?   Appearance: Normal appearance. He is not ill-appearing or diaphoretic.  ?HENT:  ?   Right Ear: External ear normal.  ?   Left Ear: External ear normal.  ?Eyes:  ?   General: No scleral icterus. ?   Extraocular Movements: Extraocular movements intact.  ?   Conjunctiva/sclera: Conjunctivae normal.  ?Cardiovascular:  ?   Rate and Rhythm: Normal rate.  ?Pulmonary:  ?   Effort: Pulmonary effort is normal.  ?Musculoskeletal:  ?   Cervical back: Neck supple.  ?   Comments: Back ?Inspection: no abnormality ?Palpation: right lumbar paraspinous ttp ?ROM: normal w/ some pain ?Strength: normal ?Straight leg raise  - induced spasm in the back w/o radiating symptoms ? ?Knee ?Inspection: no swelling, erythema ?Palpation: medial and lateral joint line ttp b/l ?Strength: normal ?  ?Skin: ?   General: Skin is warm and dry.  ?Neurological:  ?   Mental Status: He is alert. Mental status is at baseline.  ?Psychiatric:     ?   Mood and Affect: Mood normal.  ? ? ? ? ? ?   ?Assessment & Plan:  ? ?Problem List Items Addressed This Visit   ? ?  ? Other  ? Acute midline low back pain without sciatica  ?  Discussed NSAIDs are first-line.  Trial of meloxicam.  Encouraged going to physical therapy as previously referred.  Okay to take Flexeril during the day as he does not notice oversedation on this.  Return in 6 weeks after physical therapy if no improvement. ?  ?  ? Relevant Medications  ? meloxicam (MOBIC) 7.5 MG tablet  ? traMADol (ULTRAM) 50 MG tablet  ? Chronic pain of both knees  ?  Previously told he may need a knee replacement.  At this time he is not interested in that.  Briefly discussed options including steroid injection and hyaluronic  acid injections.  He is interested in discussing these options advised to schedule appointment with Dr. Lorelei Pont.  In the meantime we will trial meloxicam.  He requested tramadol to use for severe pain short prescription of this provided. ?  ?  ? Relevant Medications  ? meloxicam (MOBIC) 7.5 MG tablet  ? traMADol (ULTRAM) 50 MG tablet  ? Testicular pain, right - Primary  ?  No response to levofloxacin. STI work-up negative. [place] today. Pending results and symptoms anticipate urology referral  ?  ?  ? Relevant Orders  ? US SCROTUM W/DOPPLER  ? ? ? ?Return if symptoms worsen or fail to improve. ? ?Lesleigh Noe, MD ? ?This visit occurred during the SARS-CoV-2 public health emergency.  Safety protocols were in place, including screening questions prior to the visit, additional usage of staff PPE, and extensive cleaning of exam room while observing appropriate contact time as indicated for disinfecting  solutions.  ? ?

## 2021-06-06 NOTE — Assessment & Plan Note (Signed)
Discussed NSAIDs are first-line.  Trial of meloxicam.  Encouraged going to physical therapy as previously referred.  Okay to take Flexeril during the day as he does not notice oversedation on this.  Return in 6 weeks after physical therapy if no improvement. ?

## 2021-06-06 NOTE — Assessment & Plan Note (Signed)
No response to levofloxacin. STI work-up negative. [place] today. Pending results and symptoms anticipate urology referral  ?

## 2021-06-06 NOTE — Assessment & Plan Note (Signed)
Previously told he may need a knee replacement.  At this time he is not interested in that.  Briefly discussed options including steroid injection and hyaluronic acid injections.  He is interested in discussing these options advised to schedule appointment with Dr. Lorelei Pont.  In the meantime we will trial meloxicam.  He requested tramadol to use for severe pain short prescription of this provided. ?

## 2021-06-06 NOTE — Patient Instructions (Addendum)
Knees  ?- schedule to see Dr. Lorelei Pont with our office ?- he does injections ? ?Ultrasound ?- should hear next  ?- complete the antibiotic ? ?Back pain ?- Ok to take cyclobenzaprine during the day if it does not make you sleepy ?- Meloxicam for pain -- do not take ibuprofen with this ? ? ? ?

## 2021-06-11 ENCOUNTER — Ambulatory Visit: Payer: 59 | Admitting: Family Medicine

## 2021-06-16 ENCOUNTER — Encounter: Payer: Self-pay | Admitting: Gastroenterology

## 2021-06-18 ENCOUNTER — Encounter: Payer: Self-pay | Admitting: Family Medicine

## 2021-06-18 ENCOUNTER — Ambulatory Visit (INDEPENDENT_AMBULATORY_CARE_PROVIDER_SITE_OTHER): Payer: 59 | Admitting: Family Medicine

## 2021-06-18 ENCOUNTER — Ambulatory Visit (INDEPENDENT_AMBULATORY_CARE_PROVIDER_SITE_OTHER)
Admission: RE | Admit: 2021-06-18 | Discharge: 2021-06-18 | Disposition: A | Discharge status: Home / Self Care | Payer: 59 | Source: Ambulatory Visit | Attending: Family Medicine | Admitting: Family Medicine

## 2021-06-18 VITALS — BP 110/80 | HR 54 | Temp 98.4°F | Ht 72.0 in | Wt 189.0 lb

## 2021-06-18 DIAGNOSIS — M1711 Unilateral primary osteoarthritis, right knee: Secondary | ICD-10-CM | POA: Diagnosis not present

## 2021-06-18 DIAGNOSIS — M170 Bilateral primary osteoarthritis of knee: Secondary | ICD-10-CM

## 2021-06-18 DIAGNOSIS — M25462 Effusion, left knee: Secondary | ICD-10-CM | POA: Diagnosis not present

## 2021-06-18 DIAGNOSIS — M1712 Unilateral primary osteoarthritis, left knee: Secondary | ICD-10-CM | POA: Diagnosis not present

## 2021-06-18 MED ORDER — TRIAMCINOLONE ACETONIDE 40 MG/ML IJ SUSP
40.0000 mg | Freq: Once | INTRAMUSCULAR | Status: AC
  Administered 2021-06-18: 40 mg via INTRA_ARTICULAR

## 2021-06-18 NOTE — Progress Notes (Signed)
? ? ?Celica Kotowski T. Roda Lauture, MD, [facility] ?Therapist, music at [facility] ?[place] ?[place], [address] ? ?Phone: [phone]  FAX: [phone] ? ?VU LIEBMAN - 61 y.o. male  MRN [id number]  Date of Birth: 14-Apr-1960 ? ?Date: 06/18/2021  PCP: Lesleigh Noe, MD  Referral: Lesleigh Noe, MD ? ?Chief Complaint  ?Patient presents with  ? Knee Pain  ?  Bilateral  ? ? ?This visit occurred during the SARS-CoV-2 public health emergency.  Safety protocols were in place, including screening questions prior to the visit, additional usage of staff PPE, and extensive cleaning of exam room while observing appropriate contact time as indicated for disinfecting solutions.  ? ?Subjective:  ? ?Marcus Andrews is a 61 y.o. very pleasant male patient with Body mass index is 25.63 kg/m?. who presents with the following: ? ?Has been having some issues.  Had a torn meniscus in about 10 years.   ?Had B arthroscopy.  He is having knee pain essentially every day now.  Predominantly this is pain in the medial compartment.  He has not had any symptomatic giving way or functional locking up of the knee.  No specific injury recently. ? ?He describes his pain yesterday at a 9 out of 10.  He is ambulating okay.  Some small degree of limp.  He also has had some effusion recently. ? ?Will have good and bad days. ?Severe pain last night.  ?Not working out all that much. ?Some pain walking even over here.  ? ?Start some Mobic. ?Also did review his EGD results, and the patient does have some erosions of the GI tract.  He has not started NSAIDs since this report. ? ? ? ?Review of Systems is noted in the HPI, as appropriate ? ?Objective:  ? ?BP 110/80   Pulse (!) 54   Temp 98.4 ?F (36.9 ?C) (Oral)   Ht 6' (1.829 m)   Wt 189 lb (85.7 kg)   SpO2 97%   BMI 25.63 kg/m?  ? ?GEN: No acute distress; alert,appropriate. ?PULM: Breathing comfortably in no respiratory distress ?PSYCH: Normally interactive.   ? ? ?Knee:  B ?Gait: Normal heel toe pattern ?ROM: 0-120 ?Effusion: mild ?Echymosis or edema: none ?Patellar tendon NT ?Painful PLICA: neg ?Patellar grind: negative ?Medial and lateral patellar facet loading: negative ?medial and lateral joint lines: medial ?Mcmurray's pain ?Flexion-pinch pain ?Varus and valgus stress: stable ?Lachman: neg ?Ant and Post drawer: neg ?Hip abduction, IR, ER: WNL ?Hip flexion str: 5/5 ?Hip abd: 5/5 ?Quad: 5/5 ?VMO atrophy:No ?Hamstring concentric and eccentric: 5/5  ? ?Laboratory and Imaging Data: ? ?Assessment and Plan:  ? ?  ICD-10-CM   ?1. Primary osteoarthritis of knees, bilateral  M17.0 DG Knee 4 Views W/Patella Right  ?  DG Knee 4 Views W/Patella Left  ?  triamcinolone acetonide (KENALOG-40) injection 40 mg  ?  triamcinolone acetonide (KENALOG-40) injection 40 mg  ?  ? ?He has had some interval worsening of his degenerative changes in the knees bilaterally.  Personally reviewed the patient's plain films, and he does have some osteophytosis with moderate degree of tricompartmental osteoarthritis. ? ?He does have erosions in his GI tract seen on a recent EGD.  I would like for him to bypass his gut.  Reviewed and do a diagnostic and therapeutic injection of the knees bilaterally.  Voltaren gel as needed. ? ?Aspiration/Injection Procedure Note ?Marcus Andrews ?1960-12-18 ?Date of procedure: 06/18/2021 ? ?Procedure: Large Joint Joint Aspiration /  Injection of the Right Knee ?Indications: Pain ? ?Procedure Details ?Patient verbally consented to procedure. Risks, benefits, and alternatives explained. Sterilely prepped with Chloraprep. Ethyl cholride used for anesthesia. 9 cc Lidocaine 1% mixed with 1 mL Kenalog 40 mg injected using the anteromedial approach without difficulty. No complications with procedure and tolerated well. Patient had decreased pain post-injection.  ?Medication: 1 mL of Kenalog 40 mg ? ?Aspiration/Injection Procedure Note ?Marcus Andrews ?[birth year] ?Date of  procedure: 06/18/2021 ? ?Procedure: Large Joint Aspiration / Injection of the Left Knee ?Indications: Pain ? ?Procedure Details ?Patient verbally consented to procedure. Risks, benefits, and alternatives explained. Sterilely prepped with Chloraprep. Ethyl cholride used for anesthesia. 9 cc Lidocaine 1% mixed with 1 mL Kenalog 40 mg injected using the anteromedial approach without difficulty. No complications with procedure and tolerated well. Patient had decreased pain post-injection.  ?Medication: 1 mL of Kenalog 40 mg  ? ?Meds ordered this encounter  ?Medications  ? triamcinolone acetonide (KENALOG-40) injection 40 mg  ? triamcinolone acetonide (KENALOG-40) injection 40 mg  ? ?Medications Discontinued During This Encounter  ?Medication Reason  ? levofloxacin (LEVAQUIN) 500 MG tablet Completed Course  ? ?Orders Placed This Encounter  ?Procedures  ? DG Knee 4 Views W/Patella Right  ? DG Knee 4 Views W/Patella Left  ? ? ?Follow-up: No follow-ups on file. ? ?Dragon [employer] One speech-to-text software was used for transcription in this dictation.  Possible transcriptional errors can occur using Editor, commissioning.  ? ?Signed, ? ?Yvonna Brun T. Doniqua Saxby, MD ? ? ?Outpatient Encounter Medications as of 06/18/2021  ?Medication Sig  ? cyclobenzaprine (FLEXERIL) 10 MG tablet Take 10 mg by mouth at bedtime.  ? glycopyrrolate (ROBINUL) 1 MG tablet Take 1 tablet (1 mg total) by mouth 2 (two) times daily.  ? meloxicam (MOBIC) 7.5 MG tablet Take 1 tablet (7.5 mg total) by mouth daily.  ? pantoprazole (PROTONIX) 40 MG tablet Take 1 tablet (40 mg total) by mouth daily.  ? [DISCONTINUED] levofloxacin (LEVAQUIN) 500 MG tablet Take 500 mg by mouth daily.  ? [EXPIRED] triamcinolone acetonide (KENALOG-40) injection 40 mg   ? [EXPIRED] triamcinolone acetonide (KENALOG-40) injection 40 mg   ? ?No facility-administered encounter medications on file as of 06/18/2021.  ?  ?

## 2021-06-18 NOTE — Patient Instructions (Signed)
Voltaren 1% gel, over the counter ?You can apply up to 4 times a day ? ?This can be applied to any joint: knee, wrist, fingers, elbows, shoulders, feet and ankles. ?Can apply to any tendon: tennis elbow, achilles, tendon, rotator cuff or any other tendon. ? ?Minimal is absorbed in the bloodstream: ok with oral anti-inflammatory or a blood thinner. ? ?Cost is about 9 dollars  ?

## 2021-06-23 ENCOUNTER — Telehealth: Payer: Self-pay

## 2021-06-23 DIAGNOSIS — M25561 Pain in right knee: Secondary | ICD-10-CM | POA: Diagnosis not present

## 2021-06-23 DIAGNOSIS — M13861 Other specified arthritis, right knee: Secondary | ICD-10-CM | POA: Diagnosis not present

## 2021-06-23 NOTE — Telephone Encounter (Signed)
Pt called to say the place where his [place] is scheduled is out of network for his insurance. Needs this r/s to somewhere in network.  ?

## 2021-06-23 NOTE — Telephone Encounter (Signed)
Pt was seen on 06-18-21 for bilateral knee injections. 2 Days after the injections, his right knee started feeling stiff and severe intermittent pain. Feels like it is trying to lock up. Injection worked for 2 days. This is a totally new issue. Asking what he should do. I did provide information about the [facility] in [place] and [place].  ?

## 2021-06-23 NOTE — Telephone Encounter (Signed)
I just spoke to him on the phone.  R knee felt a lot better for a couple of days, but then over the weekend, pain started to escalate.  Now with severe pain, and he feels like his knee is locking up some.  He is not having any warmth, swelling, or redness. ? ?I want him to see Orthopedics today, and he will call [facility] right now to arrange assessment at their Ortho [facility] clinic. ?

## 2021-06-23 NOTE — Telephone Encounter (Signed)
Patient needs to contact his insurance to find out who is in Network. I dont have a way of knowing this. It is the patients responsibility to contact his insurance to find out who his insurance covers - they will give him In-Network locations and discuss with him the cost.

## 2021-06-24 NOTE — Telephone Encounter (Signed)
Patient notified as instructed by telephone and verbalized understanding. Patient stated that he will contact his [employer] and call the office back. ?

## 2021-06-24 NOTE — Telephone Encounter (Signed)
Patient called stating to disregard the previous message about [facility]. Patient stated the correct information is [facility], [place] and telephone # [phone]. ?

## 2021-06-24 NOTE — Telephone Encounter (Signed)
Patient called back can have done at [facility] in [place]  ?

## 2021-06-26 NOTE — Telephone Encounter (Signed)
Patient called to follow-up on radiology appointment. Provided patient with the appointment information for 4.19.23. ? ?Advised patient to call us back if there is any issues. ? ? ?

## 2021-07-02 ENCOUNTER — Ambulatory Visit
Admission: RE | Admit: 2021-07-02 | Discharge: 2021-07-02 | Disposition: A | Discharge status: Home / Self Care | Payer: 59 | Source: Ambulatory Visit | Attending: Family Medicine | Admitting: Family Medicine

## 2021-07-02 DIAGNOSIS — N503 Cyst of epididymis: Secondary | ICD-10-CM | POA: Diagnosis not present

## 2021-07-02 DIAGNOSIS — M25561 Pain in right knee: Secondary | ICD-10-CM | POA: Diagnosis not present

## 2021-07-02 DIAGNOSIS — N50811 Right testicular pain: Secondary | ICD-10-CM | POA: Insufficient documentation

## 2021-07-03 ENCOUNTER — Other Ambulatory Visit: Payer: Self-pay | Admitting: Family Medicine

## 2021-07-03 DIAGNOSIS — M545 Low back pain, unspecified: Secondary | ICD-10-CM

## 2021-07-03 DIAGNOSIS — G8929 Other chronic pain: Secondary | ICD-10-CM

## 2021-07-03 NOTE — Telephone Encounter (Signed)
From pt:  ?I was informed that I shouldn't take the the Meloxicam while taking Pantoprazole. Not sure where the refill request came from. ?

## 2021-07-03 NOTE — Telephone Encounter (Signed)
Please call pt for update on symptoms.  ? ?Is pain improving? Refill is slightly early - does the dose work? ?

## 2021-07-03 NOTE — Telephone Encounter (Signed)
My chart to pt

## 2021-07-04 ENCOUNTER — Other Ambulatory Visit: Payer: Self-pay | Admitting: Family Medicine

## 2021-07-04 DIAGNOSIS — N50811 Right testicular pain: Secondary | ICD-10-CM

## 2021-07-07 DIAGNOSIS — M1711 Unilateral primary osteoarthritis, right knee: Secondary | ICD-10-CM | POA: Diagnosis not present

## 2021-07-14 ENCOUNTER — Telehealth: Payer: Self-pay | Admitting: Family Medicine

## 2021-07-14 NOTE — Telephone Encounter (Signed)
Forwarded [employer] to Dr. Lorelei Pont for review.  ?

## 2021-07-14 NOTE — Telephone Encounter (Signed)
See my message.  Since Ortho just wrote for him and has seen him twice in a few weeks, I am going to defer to them for pain management for this condition. ?

## 2021-07-14 NOTE — Telephone Encounter (Signed)
Encourage patient to contact the pharmacy for refills or they can request refills through [facility] ? ?Did the patient contact the pharmacy:  No ? ?LAST APPOINTMENT DATE: 06/06/2021 ? ?MEDICATION:  traMADol (ULTRAM) tablet 50 mg ? ?Is the patient out of medication? N/A ? ?If not, how much is left? N/A ? ?Is this a 90 day supply: No ? ?PHARMACY: [id number] [place], [place] [address]?Phone Number: [phone]? ?Let patient know to contact pharmacy at the end of the day to make sure medication is ready. ? ?Please notify patient to allow 48-72 hours to process ?  ?

## 2021-07-15 NOTE — Telephone Encounter (Signed)
Noted and agree.  ?Please notify patient to obtain refills from orthopedic provider.  Please also encourage as needed use for tramadol as it can become addictive. ?

## 2021-07-15 NOTE — Telephone Encounter (Signed)
Pt was notified via mychart by Dr. Lorelei Pont.  ?

## 2021-07-16 ENCOUNTER — Ambulatory Visit: Payer: 59 | Admitting: Urology

## 2021-07-16 ENCOUNTER — Ambulatory Visit: Payer: 59 | Admitting: Gastroenterology

## 2021-07-16 ENCOUNTER — Encounter: Payer: Self-pay | Admitting: Gastroenterology

## 2021-07-16 VITALS — BP 114/72 | HR 64 | Ht 72.0 in | Wt 188.2 lb

## 2021-07-16 VITALS — BP 131/93 | HR 84 | Ht 72.0 in | Wt 188.0 lb

## 2021-07-16 DIAGNOSIS — N50811 Right testicular pain: Secondary | ICD-10-CM | POA: Diagnosis not present

## 2021-07-16 DIAGNOSIS — K2100 Gastro-esophageal reflux disease with esophagitis, without bleeding: Secondary | ICD-10-CM

## 2021-07-16 DIAGNOSIS — R140 Abdominal distension (gaseous): Secondary | ICD-10-CM

## 2021-07-16 LAB — MICROSCOPIC EXAMINATION
Bacteria, UA: NONE SEEN
RBC, Urine: NONE SEEN /hpf (ref 0–2)

## 2021-07-16 LAB — URINALYSIS, COMPLETE
Bilirubin, UA: NEGATIVE
Glucose, UA: NEGATIVE
Ketones, UA: NEGATIVE
Leukocytes,UA: NEGATIVE
Nitrite, UA: NEGATIVE
Protein,UA: NEGATIVE
RBC, UA: NEGATIVE
Specific Gravity, UA: 1.025 (ref 1.005–1.030)
Urobilinogen, Ur: 0.2 mg/dL (ref 0.2–1.0)
pH, UA: 6.5 (ref 5.0–7.5)

## 2021-07-16 MED ORDER — CYCLOBENZAPRINE HCL 5 MG PO TABS: 5.0000 mg | ORAL_TABLET | Freq: Three times a day (TID) | ORAL | 0 refills | Status: DC | PRN

## 2021-07-16 MED ORDER — OMEPRAZOLE 40 MG PO CPDR: 40.0000 mg | DELAYED_RELEASE_CAPSULE | Freq: Every day | ORAL | 11 refills | Status: DC

## 2021-07-16 NOTE — Patient Instructions (Signed)
Chronic Testicular Pain ?New Treatments for a Better Quality of Life ?Thousands of men suffer from chronic testicular pain (CTP), a disabling condition that can cause either intermittent or constant symptoms. Most testicular pain is considered chronic if the patient has had symptoms for at least three months. Approximately 25% of testicular pain has no known cause. Sometimes CTP comes on suddenly or it can also develop slowly over time. Sudden testicle pain can be the sign of an emergency and should be diagnosed and treated as soon as possible. ? ?Symptoms ?Living with CTP is different from one man to the next. Some men with CTP have constant pain, while others have pain that comes and goes. Some men only have pain during physical activities, while others only have pain when the testicle is touched or examined. The pain may be in one testicle, in both, or change from side to side. In some men, pain in the epididymis (a crescent-shaped organ around the testicle that is responsible for sperm transport and storage) is mistaken for chronic testicular pain. ? ?CTP can interfere with normal, daily living and the ability to work. Anyone who has suffered CTP knows the frustration of going from doctor to doctor trying to find a treatment that works. Our physicians offer state-of-the-art treatments and are dedicated to helping patients who suffer from CTP so that they can restore their quality of life. ? ?Men describe the sensations of CTP in many ways. It can feel like burning, aching, pressure, throbbing, heaviness, pulling, or a combination. It can also feel like a groin pull. Some men report their CTP occurs in combination with lower back pain or pain in their upper thighs or legs. ? ?Sexual activity can aggravate the pain. CTP may also worsen when sitting for long periods of time, such as at a desk job or driving a truck. Doing heavy lifting, manual work, or even swinging a golf club may trigger CTP in a person who is  prone to it. ? ?The following symptoms may accompany testicular pain: ? ?Swelling and redness of the testicles and scrotum ?Nausea or vomiting ?Fever ?Painful or burning urination or penile discharge ?Pain with intercourse or ejaculation ?Blood in semen or urine ?Diagnosing Chronic Testicular Pain ?Your urologist will perform a careful, methodical physical examination, which will include your abdomen, testicles, scrotum, and rectum. Laboratory tests such as a blood test and urinalysis may also be performed. A diagnostic test known as a scrotal ultrasound, which is a non-invasive test that uses sound waves, is done to evaluate the blood flow to the testicle and is helpful in diagnosing other conditions such as a hernia, a tumor or an infection that can be a contributing factor to CTP. ? ?Our physicians understand the agony of CTP. No matter the type of testicular pain you have, it?s important to see a doctor as soon as possible. A comprehensive physical evaluation will help determine the origin of the pain so your doctor can develop an appropriate treatment plan. ? ?Treatment for Chronic Testicular Pain ?Initial treatment for CTP involves conservative/non-surgical measures such as pain relievers, anti-inflammatory medications, pain blocks, or a fentanyl patch, which is a strong narcotic pain reliever applied to the testicle as a patch. However, these treatments only provide temporary relief, and some patients do not like the way they feel when they are on pain medicine for an extended period. They may also be concerned about driving or focusing on work duties while taking the medication. ? ?Until recently, the traditional surgical  treatment for unexplained and untreatable CTP has been to remove the epididymis or the testicle (epididymectomy and orchiectomy, respectively). Although this has been an effective CTP treatment for many men, up to 1 to 70% of men still suffer from CTP after this surgery. ? ?Now men can  benefit from an advanced surgical procedure called microsurgical denervation of the spermatic cord, also called cord stripping - a life-changing procedure for men searching for relief from chronic testicular pain. ?

## 2021-07-16 NOTE — Progress Notes (Signed)
? ? ?  Assessment   ?  ?GERD with LA Grade A esophagitis. ?Erosive gastropathy. ?Chronic abdominal bloating and discomfort. ? ? ?Recommendations  ?  ?Continue glycopyrrolate 1 mg p.o. twice daily. ?Gas-X 4 times daily as needed. ?Change to omeprazole 40 mg daily. ?Follow antireflux measures and a low gas diet. ?OK to take meloxicam or ibuprofen as long as he takes a daily PPI.  If GI symptoms worsen stop meloxicam, ibuprofen. ?REV in 1 year.  ? ? ?HPI  ?  ?This is a 61 year old male returning for follow-up of generalized abdominal pain, chronic abdominal bloating, intermittent loose stools.  He relates his reflux symptoms have been better controlled. He discontinued pantoprazole as he feels his knee pain/arthritis worsened. He would like to take meloxicam or ibuprofen. ? ?EGD 05/2021 ?- LA Grade A reflux esophagitis with no bleeding. ?- Erosive gastropathy with no bleeding and no stigmata of recent bleeding. Biopsied. ?- Normal duodenal bulb and second portion of the duodenum. ? ? ?Labs / Imaging  ?  ? ?  Latest Ref Rng & Units 03/21/2021  ? 11:57 PM 06/25/2019  ? 12:47 PM 06/30/2018  ?  6:49 AM  ?Hepatic Function  ?Total Protein 6.5 - 8.1 g/dL 7.1   8.0   7.9    ?Albumin 3.5 - 5.0 g/dL 3.8   4.0   4.1    ?AST 15 - 41 U/L $Remo'18   15   23    'lRxBd$ ?ALT 0 - 44 U/L $Remo'15   15   21    'PhBSA$ ?Alk Phosphatase 38 - 126 U/L 77   66   74    ?Total Bilirubin 0.3 - 1.2 mg/dL 0.7   1.0   0.9    ? ? ? ?  Latest Ref Rng & Units 03/21/2021  ? 11:57 PM 06/25/2019  ? 12:47 PM 06/30/2018  ?  6:49 AM  ?CBC  ?WBC 4.0 - 10.5 K/uL 6.3   11.3   7.1    ?Hemoglobin 13.0 - 17.0 g/dL 13.7   14.2   14.4    ?Hematocrit 39.0 - 52.0 % 42.2   44.2   45.5    ?Platelets 150 - 400 K/uL 239   243   236    ? ? ?Current Medications, Allergies, Past Medical History, Past Surgical History, Family History and Social History were reviewed in [employer] record. ? ? ?Physical Exam: ?General: Well developed, well nourished, no acute distress ?Head: Normocephalic  and atraumatic ?Eyes: Sclerae anicteric, EOMI ?Ears: Normal auditory acuity ?Mouth: Not examined, mask on during Covid-19 pandemic ?Lungs: Clear throughout to auscultation ?Heart: Regular rate and rhythm; no murmurs, rubs or bruits ?Abdomen: Soft, non tender and non distended. No masses, hepatosplenomegaly or hernias noted. Normal Bowel sounds ?Rectal: Not done ?Musculoskeletal: Symmetrical with no gross deformities  ?Pulses:  Normal pulses noted ?Extremities: No clubbing, cyanosis, edema or deformities noted ?Neurological: Alert oriented x 4, grossly nonfocal ?Psychological:  Alert and cooperative. Normal mood and affect ? ? ?Pricilla Riffle. Fuller Plan, MD 07/16/2021, 9:13 AM  ?

## 2021-07-16 NOTE — Progress Notes (Signed)
? ?07/16/2021 ?1:32 PM  ? ?Marcus Andrews ?February 16, 1961 ?[id number] ? ?Referring provider:  ?Lesleigh Noe, MD ?[place],  [place] [address] ?Chief Complaint  ?Patient presents with  ? Testicle Pain  ? ? ?HPI: ?Marcus Andrews is a 61 y.o.male who presents today fur further evaluation of testicular pain.  ? ?His most recent PSA was 0.70 on 05/22/2021.  ? ?He was seen by his PCP, Dr Einar Pheasant, on 07/02/2021. At time he was noted to have persistent testicular pain worse in the right and worsening in the left. He had no response to levofloxacin. He underwent a scrotal ultrasound to further evaluate scrotal discomfort of right greater than left that visualized  incidental 5 mm left epididymal cyst, of doubtful clinical significance. He was referred to urology.  ? ?He reports that he has pain in his testicles that has been ongoing for a few months. It is not painful when he walks but it is painful to the touch. He doesn't remember injuring himself in anyway, He reports that he doesn't feel pain when his testicles draw up. ? ?No urinary issues.   ? ?PMH: ?Past Medical History:  ?Diagnosis Date  ? Arthritis   ? Colon polyp   ? Diverticulosis   ? Refusal of blood transfusions as patient is Jehovah's Witness   ? ? ?Surgical History: ?Past Surgical History:  ?Procedure Laterality Date  ? COLONOSCOPY    ? KNEE ARTHROSCOPY WITH MEDIAL MENISECTOMY Right 02/05/2020  ? Procedure: Right knee arthroscopy with partial medial and lateral menisectomy, chondroplasty, partial synovectomy, removal loose body;  Surgeon: Lovell Sheehan, MD;  Location: [facility] ORS;  Service: Orthopedics;  Laterality: Right;  ? MENISCUS REPAIR Left 01/2014  ? TOOTH EXTRACTION    ? UPPER GASTROINTESTINAL ENDOSCOPY  05/29/2021  ? ? ?Home Medications:  ?Allergies as of 07/16/2021   ?No Known Allergies ?  ? ?  ?Medication List  ?  ? ?  ? Accurate as of Jul 16, 2021 11:59 PM. If you have any questions, ask your nurse or doctor.  ?  ?  ? ?  ? ?STOP taking these  medications   ? ?meloxicam 7.5 MG tablet ?Commonly known as: MOBIC ?Stopped by: Pricilla Riffle. Fuller Plan, MD ?  ?pantoprazole 40 MG tablet ?Commonly known as: PROTONIX ?Stopped by: Pricilla Riffle. Fuller Plan, MD ?  ? ?  ? ?TAKE these medications   ? ?cyclobenzaprine 5 MG tablet ?Commonly known as: FLEXERIL ?Take 1 tablet (5 mg total) by mouth 3 (three) times daily as needed for muscle spasms. ?What changed:  ?medication strength ?how much to take ?when to take this ?reasons to take this ?Changed by: Hollice Espy, MD ?  ?glycopyrrolate 1 MG tablet ?Commonly known as: Robinul ?Take 1 tablet (1 mg total) by mouth 2 (two) times daily. ?  ?omeprazole 40 MG capsule ?Commonly known as: PRILOSEC ?Take 1 capsule (40 mg total) by mouth daily. ?Started by: Pricilla Riffle. Fuller Plan, MD ?  ? ?  ? ? ?Allergies:  ?No Known Allergies ? ?Family History: ?Family History  ?Problem Relation Age of Onset  ? Breast cancer Mother   ? Colon cancer Mother 52  ? Colon polyps Mother   ? Heart disease Father   ? Heart failure Father   ? Heart attack Father 35  ? Hypertension Sister   ? Hyperlipidemia Sister   ? Asthma Sister   ? Hypertension Brother   ? Hyperlipidemia Brother   ? Deep vein thrombosis Brother   ?  Hypertension Maternal Aunt   ? Cancer Maternal Aunt   ?     type unknown  ? Hypertension Maternal Uncle   ? Liver cancer Maternal Uncle   ? Hypertension Paternal Aunt   ? Hypertension Paternal Uncle   ? Hypertension Maternal Grandmother   ? Diabetes Maternal Grandmother   ? Esophageal cancer Neg Hx   ? Rectal cancer Neg Hx   ? Stomach cancer Neg Hx   ? ? ?Social History:  reports that he quit smoking about 23 years ago. His smoking use included cigarettes. He has a 19.00 pack-year smoking history. He has never used smokeless tobacco. He reports that he does not currently use alcohol after a past usage of about 1.0 standard drink per week. He reports that he does not use drugs. ? ? ?Physical Exam: ?BP (!) 131/93   Pulse 84   Ht 6' (1.829 m)   Wt 188 lb  (85.3 kg)   BMI 25.50 kg/m?   ?Constitutional:  Alert and oriented, No acute distress. ?HEENT: [place] AT, moist mucus membranes.  Trachea midline, no masses. ?Cardiovascular: No clubbing, cyanosis, or edema. ?Respiratory: Normal respiratory effort, no increased work of breathing. ?NI:[id number] atrophic testicle right slightly more high riding there was tenderness in right tail and left and head tail of epididymis. Testicles were grape sized  ?Skin: No rashes, bruises or suspicious lesions. ?Neurologic: Grossly intact, no focal deficits, moving all 4 extremities. ?Psychiatric: Normal mood and affect. ? ?Laboratory Data: ? ?Lab Results  ?Component Value Date  ? CREATININE 1.01 03/21/2021  ? ?Lab Results  ?Component Value Date  ? HGBA1C 6.2 05/22/2021  ? ? ?Urinalysis ?Unremarkable  ? ?Pertinent Imaging: ?CLINICAL DATA:  Bilateral inguinal and scrotal discomfort right ?greater than left ?  ?EXAM: ?SCROTAL ULTRASOUND ?  ?DOPPLER ULTRASOUND OF THE TESTICLES ?  ?TECHNIQUE: ?Complete ultrasound examination of the testicles, epididymis, and ?other scrotal structures was performed. Color and spectral Doppler ?ultrasound were also utilized to evaluate blood flow to the ?testicles. ?  ?COMPARISON:  None. ?  ?FINDINGS: ?Right testicle ?  ?Measurements: 2.5 x 3.8 x 1.3 cm. No mass or microlithiasis ?visualized. ?  ?Left testicle ?  ?Measurements: 2.6 x 3.7 x 1.6 cm. No mass or microlithiasis ?visualized. ?  ?Right epididymis:  Normal in size and appearance. ?  ?Left epididymis: 5 mm left epididymal cyst. Otherwise the left ?epididymis is unremarkable. ?  ?Hydrocele:  None visualized. ?  ?Varicocele:  None visualized. ?  ?Pulsed Doppler interrogation of both testes demonstrates normal low ?resistance arterial and venous waveforms bilaterally. ?  ?Other: No evidence of inguinal hernia. ?  ?IMPRESSION: ?1. Incidental 5 mm left epididymal cyst, of doubtful clinical ?significance. ?2. Otherwise unremarkable exam. ?  ?  ?Electronically  Signed ?  By: Randa Ngo M.D. ?  On: 07/03/2021 21:14 ?  ? ?I have personally reviewed the images and agree with radiologist interpretation.  ? ? ? ?Assessment & Plan:   ? ?Testicular pain  ?- Exam today showed atrophic testicles and tenderness in epididymis x 2 ?- Discussed differential diagnosis of atypical infection versus chronic testicular/ epididymal pain. We discussed treatment options such as removing part of epididymis.  ?- Offered him muscle relaxant and he is interested in this discussed that he should not take this regularly.   ?- Will send urine to rule out any atypical infections; gonorrhea, chlamydia, mycoplasma, and ureaplasma,  ?- Recommend NSAIDs to manage pain ?- Recommend compressive care such as jock strap or compressive underwear/shorts.  ?-  Offered referral to physically therapy to rule out pathologic issues. He is interested in this  ?- Referral sent to physical therapy  ?- If pain continues or worsens recommend he follow-up.  ?- Flexeril ; prescribed  ? ?Fu prn ? ?Conley Rolls as a scribe for Hollice Espy, MD.,have documented all relevant documentation on the behalf of Hollice Espy, MD,as directed by  Hollice Espy, MD while in the presence of Hollice Espy, MD. ? ?I have reviewed the above documentation for accuracy and completeness, and I agree with the above.  ? ?Hollice Espy, MD ? ? ? ?[facility] ?[address], [address] ?[place], [place] [address] ?[phone] ? ?

## 2021-07-16 NOTE — Patient Instructions (Signed)
We have sent the following medications to your pharmacy for you to pick up at your convenience: omeprazole.  ? ?The [facility] providers would like to encourage you to use MYCHART to communicate with providers for non-urgent requests or questions.  Due to long hold times on the telephone, sending your provider a message by MYCHART may be a faster and more efficient way to get a response.  Please allow 48 business hours for a response.  Please remember that this is for non-urgent requests.  ? ?Thank you for choosing me and [facility]. ? ?Malcolm T. Stark, Jr., MD., FACG ? ? ?

## 2021-07-18 LAB — GC/CHLAMYDIA PROBE AMP
Chlamydia trachomatis, NAA: NEGATIVE
Neisseria Gonorrhoeae by PCR: NEGATIVE

## 2021-07-22 LAB — MYCOPLASMA / UREAPLASMA CULTURE
Mycoplasma hominis Culture: NEGATIVE
Ureaplasma urealyticum: NEGATIVE

## 2021-08-12 DIAGNOSIS — M1711 Unilateral primary osteoarthritis, right knee: Secondary | ICD-10-CM | POA: Diagnosis not present

## 2021-08-18 ENCOUNTER — Other Ambulatory Visit: Payer: Self-pay | Admitting: Gastroenterology

## 2021-08-18 ENCOUNTER — Other Ambulatory Visit (HOSPITAL_COMMUNITY): Payer: Self-pay

## 2021-08-20 ENCOUNTER — Telehealth: Payer: Self-pay | Admitting: Pharmacy Technician

## 2021-08-20 ENCOUNTER — Other Ambulatory Visit (HOSPITAL_COMMUNITY): Payer: Self-pay

## 2021-08-20 NOTE — Telephone Encounter (Signed)
Patient Advocate Encounter  Received notification from [place] that prior authorization for OMEPRAZOLE '40MG'$  is required.   PA submitted on 6.7.23 Key [id number] Status is pending   [facility] will continue to follow  Luciano Cutter, CPhT Patient Advocate Phone: [phone]

## 2021-08-21 ENCOUNTER — Other Ambulatory Visit (HOSPITAL_COMMUNITY): Payer: Self-pay

## 2021-08-23 ENCOUNTER — Other Ambulatory Visit (HOSPITAL_COMMUNITY): Payer: Self-pay

## 2021-08-23 NOTE — Telephone Encounter (Signed)
Received notification from [facility] regarding a prior authorization for OMEPRAZOLE '40MG'$ . Authorization has been APPROVED from 6.6.23 to 6.7.24.    Authorization # PA Case ID: [id number]

## 2021-10-10 ENCOUNTER — Encounter: Payer: Self-pay | Admitting: Gastroenterology

## 2021-10-23 ENCOUNTER — Encounter: Payer: Self-pay | Admitting: Emergency Medicine

## 2021-10-23 DIAGNOSIS — K5792 Diverticulitis of intestine, part unspecified, without perforation or abscess without bleeding: Secondary | ICD-10-CM | POA: Insufficient documentation

## 2021-10-23 LAB — CBC
HCT: 46.1 % (ref 39.0–52.0)
Hemoglobin: 14.7 g/dL (ref 13.0–17.0)
MCH: 26.5 pg (ref 26.0–34.0)
MCHC: 31.9 g/dL (ref 30.0–36.0)
MCV: 83.2 fL (ref 80.0–100.0)
Platelets: 256 10*3/uL (ref 150–400)
RBC: 5.54 MIL/uL (ref 4.22–5.81)
RDW: 15.7 % — ABNORMAL HIGH (ref 11.5–15.5)
WBC: 6.5 10*3/uL (ref 4.0–10.5)
nRBC: 0 % (ref 0.0–0.2)

## 2021-10-23 LAB — COMPREHENSIVE METABOLIC PANEL
ALT: 31 U/L (ref 0–44)
AST: 29 U/L (ref 15–41)
Albumin: 4.1 g/dL (ref 3.5–5.0)
Alkaline Phosphatase: 73 U/L (ref 38–126)
Anion gap: 8 (ref 5–15)
BUN: 18 mg/dL (ref 6–20)
CO2: 21 mmol/L — ABNORMAL LOW (ref 22–32)
Calcium: 8.8 mg/dL — ABNORMAL LOW (ref 8.9–10.3)
Chloride: 108 mmol/L (ref 98–111)
Creatinine, Ser: 0.89 mg/dL (ref 0.61–1.24)
GFR, Estimated: >60 mL/min (ref >60)
Glucose, Bld: 104 mg/dL — ABNORMAL HIGH (ref 70–99)
Potassium: 3.5 mmol/L (ref 3.5–5.1)
Sodium: 137 mmol/L (ref 135–145)
Total Bilirubin: 1 mg/dL (ref 0.3–1.2)
Total Protein: 7.8 g/dL (ref 6.5–8.1)

## 2021-10-23 LAB — LIPASE, BLOOD: Lipase: 25 U/L (ref 11–51)

## 2021-10-23 LAB — URINALYSIS, ROUTINE W REFLEX MICROSCOPIC
Bacteria, UA: NONE SEEN
Bilirubin Urine: NEGATIVE
Glucose, UA: NEGATIVE mg/dL
Hgb urine dipstick: NEGATIVE
Ketones, ur: 5 mg/dL — AB
Leukocytes,Ua: NEGATIVE
Nitrite: NEGATIVE
Protein, ur: 30 mg/dL — AB
Specific Gravity, Urine: 1.026 (ref 1.005–1.030)
pH: 5 (ref 5.0–8.0)

## 2021-10-23 LAB — LACTIC ACID, PLASMA: Lactic Acid, Venous: 1 mmol/L (ref 0.5–1.9)

## 2021-10-23 NOTE — ED Triage Notes (Addendum)
Pt presents via POV with complaints of lower abdominal pain that started this AM with associated N/V. Hx of diverticulitis with his last flare being last year. Denies rectal bleeding, hematochezia, CP or SOB.

## 2021-10-24 ENCOUNTER — Emergency Department
Admission: EM | Admit: 2021-10-24 | Discharge: 2021-10-24 | Disposition: A | Discharge status: Home / Self Care | Payer: Self-pay | Attending: Emergency Medicine | Admitting: Emergency Medicine

## 2021-10-24 ENCOUNTER — Emergency Department: Payer: Self-pay

## 2021-10-24 DIAGNOSIS — K5792 Diverticulitis of intestine, part unspecified, without perforation or abscess without bleeding: Secondary | ICD-10-CM

## 2021-10-24 MED ORDER — LACTATED RINGERS IV BOLUS
1000.0000 mL | Freq: Once | INTRAVENOUS | Status: AC
  Administered 2021-10-24: 1000 mL via INTRAVENOUS

## 2021-10-24 MED ORDER — IOHEXOL 300 MG/ML  SOLN
100.0000 mL | Freq: Once | INTRAMUSCULAR | Status: AC | PRN
Start: 2021-10-24 — End: 2021-10-24
  Administered 2021-10-24: 100 mL via INTRAVENOUS

## 2021-10-24 MED ORDER — AMOXICILLIN-POT CLAVULANATE 875-125 MG PO TABS: 1.0000 | ORAL_TABLET | Freq: Three times a day (TID) | ORAL | 0 refills | Status: AC

## 2021-10-24 MED ORDER — ONDANSETRON 4 MG PO TBDP: ORAL_TABLET | ORAL | 0 refills | Status: DC

## 2021-10-24 MED ORDER — ONDANSETRON HCL 4 MG/2ML IJ SOLN
4.0000 mg | INTRAMUSCULAR | Status: AC
  Administered 2021-10-24: 4 mg via INTRAVENOUS
  Filled 2021-10-24: qty 2

## 2021-10-24 MED ORDER — AMOXICILLIN-POT CLAVULANATE 875-125 MG PO TABS
1.0000 | ORAL_TABLET | Freq: Once | ORAL | Status: AC
  Administered 2021-10-24: 1 via ORAL
  Filled 2021-10-24: qty 1

## 2021-10-24 NOTE — ED Provider Notes (Addendum)
[facility] Provider Note    Event Date/Time   First MD Initiated Contact with Patient 10/24/21 0202     (approximate)   History   Abdominal Pain   HPI  Marcus Andrews is a 60 y.o. male whose history is notable for 2 prior episodes of diverticulitis.  He presents tonight for acute onset left lower quadrant abdominal pain associate with nausea, vomiting, and diarrhea.  He said it feels similar but milder to the last time he had a flare of diverticulitis which is about a year ago.  He also wonders if he could have had a bad food exposure, because after eating some "bad green beans" the symptoms started.  He said the pain is better right now but it was sharp and stabbing and located in the left lower abdomen previously.  He is still having some nausea.  He has had multiple episodes of vomiting and diarrhea today and he feels "dehydrated", but he is not having any dizziness and no episodes of near syncope.  No recent fever or chills.  He is ambulatory without difficulty and has no focal weakness or numbness.     Physical Exam   Triage Vital Signs: ED Triage Vitals  Enc Vitals Group     BP 10/23/21 2020 109/72     Pulse Rate 10/23/21 2020 (!) 103     Resp 10/23/21 2020 18     Temp 10/23/21 2020 (!) 100.7 F (38.2 C)     Temp Source 10/23/21 2020 Oral     SpO2 10/23/21 2020 100 %     Weight 10/23/21 2021 86.2 kg (190 lb)     Height 10/23/21 2021 1.829 m (6')     Head Circumference --      Peak Flow --      Pain Score 10/23/21 2023 7     Pain Loc --      Pain Edu? --      Excl. in [place]? --     Most recent vital signs: Vitals:   10/24/21 0146 10/24/21 0204  BP: 108/79   Pulse: 92   Resp: 20   Temp: 100.3 F (37.9 C) 100.1 F (37.8 C)  SpO2: 99%      General: Awake, no distress.  Generally well-appearing. CV:  Good peripheral perfusion.  Normal heart rate. Resp:  Normal effort.  No accessory muscle usage. Abd:  No distention.  Slight guarding  to palpation of the left lower quadrant but no peritonitis.  Mild tenderness to palpation. Other:  Ambulatory without difficulty, no focal neurological deficits, mood and affect are appropriate under the circumstances.   ED Results / Procedures / Treatments   Labs (all labs ordered are listed, but only abnormal results are displayed) Labs Reviewed  COMPREHENSIVE METABOLIC PANEL - Abnormal; Notable for the following components:      Result Value   CO2 21 (*)    Glucose, Bld 104 (*)    Calcium 8.8 (*)    All other components within normal limits  CBC - Abnormal; Notable for the following components:   RDW 15.7 (*)    All other components within normal limits  URINALYSIS, ROUTINE W REFLEX MICROSCOPIC - Abnormal; Notable for the following components:   Color, Urine YELLOW (*)    APPearance HAZY (*)    Ketones, ur 5 (*)    Protein, ur 30 (*)    All other components within normal limits  CULTURE, BLOOD (SINGLE)  LIPASE, BLOOD  LACTIC ACID, PLASMA  LACTIC ACID, PLASMA     RADIOLOGY As described below, patient has acute uncomplicated diverticulitis.  See hospital course for details.    PROCEDURES:  Critical Care performed: No  Procedures   MEDICATIONS ORDERED IN ED: Medications  amoxicillin-clavulanate (AUGMENTIN) 875-125 MG per tablet 1 tablet (has no administration in time range)  ondansetron (ZOFRAN) injection 4 mg (4 mg Intravenous Given 10/24/21 0235)  lactated ringers bolus 1,000 mL (1,000 mLs Intravenous New Bag/Given 10/24/21 0236)  iohexol (OMNIPAQUE) 300 MG/ML solution 100 mL (100 mLs Intravenous Contrast Given 10/24/21 0304)     IMPRESSION / MDM / ASSESSMENT AND PLAN / ED COURSE  I reviewed the triage vital signs and the nursing notes.                              Differential diagnosis includes, but is not limited to, diverticulitis, appendicitis, gallbladder disease, viral gastroenteritis, bacterial gastroenteritis.  Patient's presentation is most  consistent with acute presentation with potential threat to life or bodily function.  Patient was initially slightly febrile upon arrival with tachycardia.  His fever has come down but he is still about 100.1.  Heart rate is down in the 90s.  Labs/studies ordered: CBC, lactic acid, lipase, blood culture x 1, CMP, urinalysis.  His labs are all reassuring and within normal limits with no significant electrolyte abnormalities, normal lactic acid, no leukocytosis.  Also no evidence of UTI.  Given his history, his fever, and left lower quadrant tenderness palpation, I ordered a CT scan of the abdomen pelvis with IV contrast.  I also ordered LR 1 L IV bolus and Zofran 4 mg IV.  I will reassess after the imaging but anticipate he may be appropriate for discharge and outpatient follow-up.  He is not having profuse or continuous diarrhea and I think that there is very little yield to obtaining stool studies at this time given that it is rare that even a fever causing bacterial gastroenteritis such as Salmonella requires antibiotics, particularly in an otherwise generally healthy individual.   Clinical Course as of 10/24/21 0343  Fri Oct 24, 2021  0335 CT ABDOMEN PELVIS W CONTRAST I viewed and interpreted the patient's CT of the abdomen and pelvis.  I did not see any evidence of free air or abscess.  The radiologist identified an area of diverticulitis that appears uncomplicated.  I will treat with Augmentin with the higher dose recommended for diverticulitis.  I will also give the patient a prescription for Zofran ODT as listed below.  I gave him my usual customary follow-up recommendations and return precautions and he understands and agrees with the plan.  He is tolerating oral intake in the emergency department and took a first dose of Augmentin. [CF]  1610 CT ABDOMEN PELVIS W CONTRAST [CF]  9604 Of note, I reviewed the [place] controlled substance database, and it appears that the patient gets  regular prescriptions for tramadol.  He can continue taking that for pain control as he treats his acute infection. [CF]    Clinical Course User Index [CF] Hinda Kehr, MD     FINAL CLINICAL IMPRESSION(S) / ED DIAGNOSES   Final diagnoses:  Acute diverticulitis     Rx / DC Orders   ED Discharge Orders          Ordered    amoxicillin-clavulanate (AUGMENTIN) 875-125 MG tablet  Every 8 hours  10/24/21 0341    ondansetron (ZOFRAN-ODT) 4 MG disintegrating tablet        10/24/21 0341             Note:  This document was prepared using Dragon voice recognition software and may include unintentional dictation errors.   Hinda Kehr, MD 10/24/21 2633    Hinda Kehr, MD 10/24/21 [phone]

## 2021-10-24 NOTE — ED Notes (Signed)
Patient is resting comfortably. 

## 2021-10-24 NOTE — Discharge Instructions (Addendum)
We believe your symptoms are caused by diverticulitis.  Most of the time this condition (please read through the included information) can be cured with outpatient antibiotics.  Please take the full course of prescribed medication(s) and follow up with the doctors recommended above.  Continue taking your regular pain medicine (tramadol).  You can also take ibuprofen according to label instructions as needed.  We also wrote you a prescription for Zofran in case you continue to have nausea or vomiting.  Make sure to drink plenty of fluids.  Return to the ED if your abdominal pain worsens or fails to improve, you develop bloody vomiting, bloody diarrhea, you are unable to tolerate fluids due to vomiting, fever greater than 101, or other symptoms that concern you.

## 2021-10-28 LAB — CULTURE, BLOOD (SINGLE): Culture: NO GROWTH

## 2022-03-17 NOTE — Progress Notes (Unsigned)
    Carin Shipp T. Raini Tiley, MD, [place] at [facility] [place], [address]  Phone: [phone]  FAX: [phone]  Marcus Andrews - 62 y.o. male  MRN [id number]  Date of Birth: 1960/10/17  Date: 03/18/2022  PCP: Waunita Schooner, MD  Referral: Waunita Schooner, MD  No chief complaint on file.  Subjective:   Marcus Andrews is a 61 y.o. very pleasant male patient with There is no height or weight on file to calculate BMI. who presents with the following:  Patient presents with ongoing leg pain:   I saw him for some B knee OA in 06/2021.  At that point, I did do B knee injections.     Review of Systems is noted in the HPI, as appropriate  Objective:   There were no vitals taken for this visit.  GEN: No acute distress; alert,appropriate. PULM: Breathing comfortably in no respiratory distress PSYCH: Normally interactive.   Laboratory and Imaging Data:  Assessment and Plan:   ***

## 2022-03-18 ENCOUNTER — Encounter: Payer: Self-pay | Admitting: Family Medicine

## 2022-03-18 ENCOUNTER — Ambulatory Visit: Payer: 59 | Admitting: Family Medicine

## 2022-03-18 VITALS — BP 116/72 | HR 60 | Temp 97.4°F | Ht 72.0 in | Wt 188.0 lb

## 2022-03-18 DIAGNOSIS — G5701 Lesion of sciatic nerve, right lower limb: Secondary | ICD-10-CM | POA: Diagnosis not present

## 2022-04-01 ENCOUNTER — Ambulatory Visit: Payer: Self-pay | Admitting: Gastroenterology

## 2022-04-21 ENCOUNTER — Encounter: Payer: Self-pay | Admitting: Gastroenterology

## 2022-04-21 ENCOUNTER — Ambulatory Visit: Payer: 59 | Admitting: Gastroenterology

## 2022-04-21 VITALS — BP 114/72 | HR 70 | Ht 72.0 in | Wt 186.0 lb

## 2022-04-21 DIAGNOSIS — R109 Unspecified abdominal pain: Secondary | ICD-10-CM

## 2022-04-21 DIAGNOSIS — K2100 Gastro-esophageal reflux disease with esophagitis, without bleeding: Secondary | ICD-10-CM | POA: Diagnosis not present

## 2022-04-21 DIAGNOSIS — R1084 Generalized abdominal pain: Secondary | ICD-10-CM

## 2022-04-21 MED ORDER — OMEPRAZOLE 40 MG PO CPDR: 40.0000 mg | DELAYED_RELEASE_CAPSULE | Freq: Every day | ORAL | 11 refills | Status: DC

## 2022-04-21 MED ORDER — GLYCOPYRROLATE 1 MG PO TABS: 1.0000 mg | ORAL_TABLET | Freq: Two times a day (BID) | ORAL | 11 refills | Status: DC

## 2022-04-21 NOTE — Progress Notes (Signed)
    Assessment     History of recurrent diverticulitis GERD with LA Grade A esophagitis and erosive gastropathy FHCC Intermittent left sided abdominal pain and bloating   Recommendations    Long-term high-fiber diet with adequate daily water intake Follow antireflux measures and resume omeprazole 40 mg daily if dyspeptic or reflux symptoms return Colonoscopy due in June 2026 Continue glycopyrrolate 1 mg p.o. twice daily as needed REV as needed   HPI    This is a 62 year old male evaluated in the ED in August for recurrent diverticulitis.  He was treated with a course of Augmentin and his symptoms promptly resolved.  His reflux symptoms are no longer active and he is not taking omeprazole.  He notes intermittent problems with mild left-sided abdominal pain associated with bloating.  He notes that glycopyrrolate as needed has been helpful for the symptoms.  He also notes eliminating red meat has helped.  His appetite is good and his weight is stable.  Colonoscopy June 2021: 2 polyps, moderate left colon diverticulitis, internal hemorrhoids Path: hyperplastic  Labs / Imaging       Latest Ref Rng & Units 10/23/2021    8:23 PM 03/21/2021   11:57 PM 06/25/2019   12:47 PM  Hepatic Function  Total Protein 6.5 - 8.1 g/dL 7.8  7.1  8.0   Albumin 3.5 - 5.0 g/dL 4.1  3.8  4.0   AST 15 - 41 U/L '29  18  15   '$ ALT 0 - 44 U/L '31  15  15   '$ Alk Phosphatase 38 - 126 U/L 73  77  66   Total Bilirubin 0.3 - 1.2 mg/dL 1.0  0.7  1.0        Latest Ref Rng & Units 10/23/2021    8:23 PM 03/21/2021   11:57 PM 06/25/2019   12:47 PM  CBC  WBC 4.0 - 10.5 K/uL 6.5  6.3  11.3   Hemoglobin 13.0 - 17.0 g/dL 14.7  13.7  14.2   Hematocrit 39.0 - 52.0 % 46.1  42.2  44.2   Platelets 150 - 400 K/uL 256  239  243    Current Medications, Allergies, Past Medical History, Past Surgical History, Family History and Social History were reviewed in [employer] record.   Physical Exam: General:  Well developed, well nourished, no acute distress Head: Normocephalic and atraumatic Eyes: Sclerae anicteric, EOMI Ears: Normal auditory acuity Mouth: No deformities or lesions noted Lungs: Clear throughout to auscultation Heart: Regular rate and rhythm; No murmurs, rubs or bruits Abdomen: Soft, non tender and non distended. No masses, hepatosplenomegaly or hernias noted. Normal Bowel sounds Rectal: Not done Musculoskeletal: Symmetrical with no gross deformities  Pulses:  Normal pulses noted Extremities: No edema or deformities noted Neurological: Alert oriented x 4, grossly nonfocal Psychological:  Alert and cooperative. Normal mood and affect   Haizel Gatchell T. Fuller Plan, MD 04/21/2022, 2:02 PM

## 2022-04-21 NOTE — Patient Instructions (Signed)
We have sent the following medications to your pharmacy for you to pick up at your convenience: glycopyrrolate and omeprazole.  The [facility] providers would like to encourage you to use [facility] to communicate with providers for non-urgent requests or questions.  Due to long hold times on the telephone, sending your provider a message by [facility] may be a faster and more efficient way to get a response.  Please allow 48 business hours for a response.  Please remember that this is for non-urgent requests.   Thank you for choosing me and [facility].  Pricilla Riffle. Dagoberto Ligas., MD., Marval Regal

## 2022-04-22 DIAGNOSIS — M170 Bilateral primary osteoarthritis of knee: Secondary | ICD-10-CM | POA: Diagnosis not present

## 2022-05-06 DIAGNOSIS — M25561 Pain in right knee: Secondary | ICD-10-CM | POA: Diagnosis not present

## 2022-05-06 DIAGNOSIS — M25562 Pain in left knee: Secondary | ICD-10-CM | POA: Diagnosis not present

## 2022-05-14 DIAGNOSIS — M25562 Pain in left knee: Secondary | ICD-10-CM | POA: Diagnosis not present

## 2022-05-14 DIAGNOSIS — M25561 Pain in right knee: Secondary | ICD-10-CM | POA: Diagnosis not present

## 2022-07-15 ENCOUNTER — Encounter: Payer: Self-pay | Admitting: Family

## 2022-07-15 ENCOUNTER — Ambulatory Visit (INDEPENDENT_AMBULATORY_CARE_PROVIDER_SITE_OTHER): Payer: 59 | Admitting: Family

## 2022-07-15 VITALS — BP 126/78 | HR 70 | Temp 97.8°F | Ht 72.0 in | Wt 184.8 lb

## 2022-07-15 DIAGNOSIS — R0683 Snoring: Secondary | ICD-10-CM | POA: Insufficient documentation

## 2022-07-15 DIAGNOSIS — Z113 Encounter for screening for infections with a predominantly sexual mode of transmission: Secondary | ICD-10-CM

## 2022-07-15 DIAGNOSIS — Z125 Encounter for screening for malignant neoplasm of prostate: Secondary | ICD-10-CM | POA: Diagnosis not present

## 2022-07-15 DIAGNOSIS — M170 Bilateral primary osteoarthritis of knee: Secondary | ICD-10-CM

## 2022-07-15 DIAGNOSIS — J301 Allergic rhinitis due to pollen: Secondary | ICD-10-CM

## 2022-07-15 DIAGNOSIS — Z13220 Encounter for screening for lipoid disorders: Secondary | ICD-10-CM | POA: Diagnosis not present

## 2022-07-15 DIAGNOSIS — Z122 Encounter for screening for malignant neoplasm of respiratory organs: Secondary | ICD-10-CM

## 2022-07-15 DIAGNOSIS — R7303 Prediabetes: Secondary | ICD-10-CM

## 2022-07-15 DIAGNOSIS — Z0000 Encounter for general adult medical examination without abnormal findings: Secondary | ICD-10-CM | POA: Insufficient documentation

## 2022-07-15 DIAGNOSIS — K5790 Diverticulosis of intestine, part unspecified, without perforation or abscess without bleeding: Secondary | ICD-10-CM | POA: Insufficient documentation

## 2022-07-15 DIAGNOSIS — E785 Hyperlipidemia, unspecified: Secondary | ICD-10-CM

## 2022-07-15 DIAGNOSIS — G479 Sleep disorder, unspecified: Secondary | ICD-10-CM | POA: Diagnosis not present

## 2022-07-15 LAB — BASIC METABOLIC PANEL
BUN: 12 mg/dL (ref 6–23)
CO2: 28 mEq/L (ref 19–32)
Calcium: 9 mg/dL (ref 8.4–10.5)
Chloride: 104 mEq/L (ref 96–112)
Creatinine, Ser: 1.03 mg/dL (ref 0.40–1.50)
GFR: 78.97 mL/min (ref >60.00)
Glucose, Bld: 99 mg/dL (ref 70–99)
Potassium: 4.2 mEq/L (ref 3.5–5.1)
Sodium: 140 mEq/L (ref 135–145)

## 2022-07-15 LAB — CBC
HCT: 42.7 % (ref 39.0–52.0)
Hemoglobin: 14 g/dL (ref 13.0–17.0)
MCHC: 32.8 g/dL (ref 30.0–36.0)
MCV: 82.6 fl (ref 78.0–100.0)
Platelets: 285 10*3/uL (ref 150.0–400.0)
RBC: 5.17 Mil/uL (ref 4.22–5.81)
RDW: 15.5 % (ref 11.5–15.5)
WBC: 5.6 10*3/uL (ref 4.0–10.5)

## 2022-07-15 LAB — LIPID PANEL
Cholesterol: 234 mg/dL — ABNORMAL HIGH (ref 0–200)
HDL: 64.9 mg/dL (ref >39.00)
LDL Cholesterol: 153 mg/dL — ABNORMAL HIGH (ref 0–99)
NonHDL: 168.94
Total CHOL/HDL Ratio: 4
Triglycerides: 79 mg/dL (ref 0.0–149.0)
VLDL: 15.8 mg/dL (ref 0.0–40.0)

## 2022-07-15 LAB — PSA: PSA: 0.88 ng/mL (ref 0.10–4.00)

## 2022-07-15 LAB — HEMOGLOBIN A1C: Hgb A1c MFr Bld: 6.1 % (ref 4.6–6.5)

## 2022-07-15 NOTE — Assessment & Plan Note (Signed)
Patient Counseling(The following topics were reviewed):  Preventative care handout given to pt  Health maintenance and immunizations reviewed. Please refer to Health maintenance section. Pt advised on safe sex, wearing seatbelts in car, and proper nutrition labwork ordered today for annual Dental health: Discussed importance of regular tooth brushing, flossing, and dental visits.  D/w pt on screening for prostate cancer. Pt agreeable to ordering psa. We will order today, pending results.

## 2022-07-15 NOTE — Progress Notes (Signed)
Established Patient Office Visit  Subjective:  Patient ID: Marcus Andrews, male    DOB: October 24, 1960  Age: 62 y.o. MRN: [id number]  CC:  Chief Complaint  Patient presents with   Establish Care    [facility] from Dr Selena Batten    HPI Marcus Andrews is here for a transition of care visit.  Prior provider was: Dr. Gweneth Dimitri    Pt is with acute concerns.   Overdue for dental screenings,  Overdue for eye exam, wears glasses.  No known h/o STDS Does not have a living will but working on this.  Exercise: does not often exercise only rarely  Regular diet Colonoscopy 08/19/19  Tetanus utd  Shingles vaccine: declines today but will get in the future.    Having problems with sleeping, will wake up and unable to fall back to sleep. This has become worse for him in the last six months.  He is able to fall asleep without asleep. He does snore at time, not overly tired throughout the day. Has tried melatonin, which puts him to sleep but then still wakes up.        Past Medical History:  Diagnosis Date   Arthritis    Colon polyp    Diverticulosis    Refusal of blood transfusions as patient is Jehovah's Witness     Past Surgical History:  Procedure Laterality Date   COLONOSCOPY     KNEE ARTHROSCOPY WITH MEDIAL MENISECTOMY Right 02/05/2020   Procedure: Right knee arthroscopy with partial medial and lateral menisectomy, chondroplasty, partial synovectomy, removal loose body;  Surgeon: Lyndle Herrlich, MD;  Location: [facility] ORS;  Service: Orthopedics;  Laterality: Right;   MENISCUS REPAIR Left 01/2014   TOOTH EXTRACTION     UPPER GASTROINTESTINAL ENDOSCOPY  05/29/2021    Family History  Problem Relation Age of Onset   Breast cancer Mother    Colon cancer Mother 70   Colon polyps Mother    Heart disease Father    Heart failure Father    Heart attack Father 46   Hypertension Sister    Hyperlipidemia Sister    Asthma Sister    Aneurysm Sister    Hypertension Brother     Hyperlipidemia Brother    Deep vein thrombosis Brother    Hypertension Maternal Grandmother    Diabetes Maternal Grandmother    Hypertension Maternal Aunt    Cancer Maternal Aunt        type unknown   Hypertension Maternal Uncle    Liver cancer Maternal Uncle    Hypertension Paternal Aunt    Hypertension Paternal Uncle    Esophageal cancer Neg Hx    Rectal cancer Neg Hx    Stomach cancer Neg Hx     Social History   Socioeconomic History   Marital status: Single    Spouse name: Not on file   Number of children: 1   Years of education: Not on file   Highest education level: Not on file  Occupational History   Occupation: owner/operator  Tobacco Use   Smoking status: Former    Packs/day: 0.50    Years: 38.00    Additional pack years: 0.00    Total pack years: 19.00    Types: Cigarettes    Quit date: 05/28/1998    Years since quitting: 24.1   Smokeless tobacco: Never  Vaping Use   Vaping Use: Never used  Substance and Sexual Activity   Alcohol use: Not Currently    Alcohol/week: 1.0  standard drink of alcohol    Types: 1 Cans of beer per week    Comment: a few times a year   Drug use: Never   Sexual activity: Not Currently    Birth control/protection: Condom  Other Topics Concern   Not on file  Social History Narrative   05/22/21   From: the area   Living: alone   Work: Teacher, adult education      Family: family is all over the place, has cousins which are local in [place]      Enjoys: learning to play guitar, DJ, music, Counsellor      Exercise: knee/back pain limit this   Diet: could be better - avoids red meats/pork      Safety   Seat belts: Yes    Guns: No   Safe in relationships: Yes       Daughter: 1988   Social Determinants of Corporate investment banker Strain: Not on file  Food Insecurity: Not on file  Transportation Needs: Not on file  Physical Activity: Not on file  Stress: Not on file  Social Connections: Not on file  Intimate  Partner Violence: Not on file    Outpatient Medications Prior to Visit  Medication Sig Dispense Refill   glycopyrrolate (ROBINUL) 1 MG tablet Take 1 tablet (1 mg total) by mouth 2 (two) times daily. 60 tablet 11   traMADol (ULTRAM) 50 MG tablet Take by mouth.     omeprazole (PRILOSEC) 40 MG capsule Take 1 capsule (40 mg total) by mouth daily. 30 capsule 11   No facility-administered medications prior to visit.    No Known Allergies  ROS: Pertinent symptoms negative unless otherwise noted in HPI     Objective:    Physical Exam Constitutional:      General: He is not in acute distress.    Appearance: Normal appearance. He is normal weight. He is not ill-appearing, toxic-appearing or diaphoretic.  HENT:     Head: Normocephalic.     Right Ear: Tympanic membrane normal.     Left Ear: Tympanic membrane normal.     Nose: Nose normal.     Right Turbinates: Enlarged and swollen.     Left Turbinates: Enlarged and swollen.  Eyes:     Pupils: Pupils are equal, round, and reactive to light.  Cardiovascular:     Rate and Rhythm: Normal rate and regular rhythm.  Pulmonary:     Effort: Pulmonary effort is normal.     Breath sounds: Normal breath sounds.  Abdominal:     General: Abdomen is flat. Bowel sounds are normal.     Palpations: Abdomen is soft.     Tenderness: There is no abdominal tenderness.  Musculoskeletal:        General: Normal range of motion.     Cervical back: Normal range of motion.  Skin:    General: Skin is warm.  Neurological:     General: No focal deficit present.     Mental Status: He is alert and oriented to person, place, and time.     Motor: No weakness.     Gait: Gait normal.  Psychiatric:        Mood and Affect: Mood normal.        Behavior: Behavior normal.        Thought Content: Thought content normal.        Judgment: Judgment normal.      BP 126/78 (BP Location: Left Arm)   Pulse  70   Temp 97.8 F (36.6 C) (Temporal)   Ht 6' (1.829 m)    Wt 184 lb 12.8 oz (83.8 kg)   SpO2 98%   BMI 25.06 kg/m  Wt Readings from Last 3 Encounters:  07/15/22 184 lb 12.8 oz (83.8 kg)  04/21/22 186 lb (84.4 kg)  03/18/22 188 lb (85.3 kg)     There are no preventive care reminders to display for this patient.   There are no preventive care reminders to display for this patient.  Lab Results  Component Value Date   TSH 0.90 02/15/2017   Lab Results  Component Value Date   WBC 6.5 10/23/2021   HGB 14.7 10/23/2021   HCT 46.1 10/23/2021   MCV 83.2 10/23/2021   PLT 256 10/23/2021   Lab Results  Component Value Date   NA 137 10/23/2021   K 3.5 10/23/2021   CO2 21 (L) 10/23/2021   GLUCOSE 104 (H) 10/23/2021   BUN 18 10/23/2021   CREATININE 0.89 10/23/2021   BILITOT 1.0 10/23/2021   ALKPHOS 73 10/23/2021   AST 29 10/23/2021   ALT 31 10/23/2021   PROT 7.8 10/23/2021   ALBUMIN 4.1 10/23/2021   CALCIUM 8.8 (L) 10/23/2021   ANIONGAP 8 10/23/2021   GFR 100.91 02/15/2017   Lab Results  Component Value Date   CHOL 256 (H) 05/22/2021   Lab Results  Component Value Date   HDL 77.40 05/22/2021   Lab Results  Component Value Date   LDLCALC 158 (H) 05/22/2021   Lab Results  Component Value Date   TRIG 104.0 05/22/2021   Lab Results  Component Value Date   CHOLHDL 3 05/22/2021   Lab Results  Component Value Date   HGBA1C 6.2 05/22/2021      Assessment & Plan:   Primary osteoarthritis of both knees Assessment & Plan: Cont f/u with orthopedist as scheduled.   Sleep disorder -     Ambulatory referral to Pulmonology  Snoring Assessment & Plan: Referring for eval of sleep apnea possibly. Otherwise pt to work on sleep hygiene  Orders: -     Ambulatory referral to Pulmonology  Low calcium levels  Diverticulosis  Screening for hyperlipidemia -     Lipid panel  Encounter for general adult medical examination without abnormal findings Assessment & Plan: Patient Counseling(The following topics were  reviewed):  Preventative care handout given to pt  Health maintenance and immunizations reviewed. Please refer to Health maintenance section. Pt advised on safe sex, wearing seatbelts in car, and proper nutrition labwork ordered today for annual Dental health: Discussed importance of regular tooth brushing, flossing, and dental visits.  D/w pt on screening for prostate cancer. Pt agreeable to ordering psa. We will order today, pending results.  Orders: -     Lipid panel -     Basic metabolic panel -     CBC  Prediabetes Assessment & Plan: Pt advised of the following: Work on a diabetic diet, try to incorporate exercise at least 20-30 a day for 3 days a week or more.    Orders: -     Hemoglobin A1c  Screening for lung cancer -     Ambulatory Referral for Lung Cancer Scre  Screening for malignant neoplasm of prostate -     PSA  Dyslipidemia Assessment & Plan: Ordered lipid panel, pending results. Work on low cholesterol diet and exercise as tolerated    Seasonal allergic rhinitis due to pollen Assessment & Plan: On PE suggestive of  active rhinitis Zyrtec prn      No orders of the defined types were placed in this encounter.   Follow-up: Return in about 1 year (around 07/15/2023) for f/u CPE.    Mort Sawyers, FNP

## 2022-07-15 NOTE — Assessment & Plan Note (Signed)
On PE suggestive of active rhinitis Zyrtec prn

## 2022-07-15 NOTE — Assessment & Plan Note (Signed)
Ordered lipid panel, pending results. Work on low cholesterol diet and exercise as tolerated  

## 2022-07-15 NOTE — Addendum Note (Signed)
Addended by: Mort Sawyers on: 07/15/2022 11:08 AM   Modules accepted: Orders

## 2022-07-15 NOTE — Assessment & Plan Note (Signed)
Cont f/u with orthopedist as scheduled.

## 2022-07-15 NOTE — Assessment & Plan Note (Signed)
Referring for eval of sleep apnea possibly. Otherwise pt to work on sleep hygiene

## 2022-07-15 NOTE — Patient Instructions (Addendum)
  Please work on the following to help with sleep:  -Sleep only long enough to feel rested then get out of bed -Go to bed and get up at the same time every day. -Do not try to force yourself to sleep. If you can't sleep, get out of bed adn try again later. -Have coffee, tea, and other foods that have caffeine only in the morning. -Avoid alcohol -Keep your bedroom dark, cool, quiet, and free of reminders of work or other things that cause you stress -Exercise several days a week, but not right before bed -Avoid looking at phones or reading devices ("e-books") that give off light before bed. This can make it harder to fall asleep   ------------------------------------ A referral was placed today for both lung cancer screening as well as for a sleep study. Please let us know if you have not heard back within 2 weeks about the referral.  ------------------------------------ Welcome to our clinic, I am happy to have you as my new patient. I am excited to continue on this healthcare journey with you.  ------------------------------------  Stop by the lab prior to leaving today. I will notify you of your results once received.   Please keep in mind Any my chart messages you send have up to a three business day turnaround for a response.  Phone calls may take up to a one full business day turnaround for a  response.   If you need a medication refill I recommend you request it through the pharmacy as this is easiest for [place] rather than sending a message and or phone call.   Due to recent changes in healthcare laws, you may see results of your imaging and/or laboratory studies on MyChart before I have had a chance to review them.  I understand that in some cases there may be results that are confusing or concerning to you. Please understand that not all results are received at the same time and often I may need to interpret multiple results in order to provide you with the best plan of care or  course of treatment. Therefore, I ask that you please give me 2 business days to thoroughly review all your results before contacting my office for clarification. Should we see a critical lab result, you will be contacted sooner.   It was a pleasure seeing you today! Please do not hesitate to reach out with any questions and or concerns.  Regards,   Mort Sawyers FNP-C

## 2022-07-15 NOTE — Assessment & Plan Note (Signed)
Pt advised of the following: Work on a diabetic diet, try to incorporate exercise at least 20-30 a day for 3 days a week or more.   

## 2022-07-16 LAB — HIV ANTIBODY (ROUTINE TESTING W REFLEX): HIV 1&2 Ab, 4th Generation: NONREACTIVE

## 2022-07-16 LAB — RPR: RPR Ser Ql: NONREACTIVE

## 2022-07-16 LAB — HEPATITIS C ANTIBODY: Hepatitis C Ab: NONREACTIVE

## 2022-07-18 LAB — CHLAMYDIA/GONOCOCCUS/TRICHOMONAS, NAA
Chlamydia by NAA: NEGATIVE
Gonococcus by NAA: NEGATIVE
Trich vag by NAA: NEGATIVE

## 2022-07-23 ENCOUNTER — Other Ambulatory Visit: Payer: Self-pay | Admitting: Family

## 2022-07-23 DIAGNOSIS — E785 Hyperlipidemia, unspecified: Secondary | ICD-10-CM

## 2022-07-23 MED ORDER — ATORVASTATIN CALCIUM 10 MG PO TABS: 10.0000 mg | ORAL_TABLET | Freq: Every day | ORAL | 3 refills | Status: DC

## 2022-08-10 ENCOUNTER — Other Ambulatory Visit: Payer: Self-pay | Admitting: Gastroenterology

## 2022-08-10 DIAGNOSIS — R1084 Generalized abdominal pain: Secondary | ICD-10-CM

## 2022-08-10 DIAGNOSIS — K2210 Ulcer of esophagus without bleeding: Secondary | ICD-10-CM

## 2022-08-10 DIAGNOSIS — K259 Gastric ulcer, unspecified as acute or chronic, without hemorrhage or perforation: Secondary | ICD-10-CM

## 2022-08-26 ENCOUNTER — Encounter: Payer: Self-pay | Admitting: Family

## 2022-08-26 ENCOUNTER — Ambulatory Visit (INDEPENDENT_AMBULATORY_CARE_PROVIDER_SITE_OTHER)
Admission: RE | Admit: 2022-08-26 | Discharge: 2022-08-26 | Disposition: A | Discharge status: Home / Self Care | Payer: 59 | Source: Ambulatory Visit | Attending: Family | Admitting: Family

## 2022-08-26 ENCOUNTER — Ambulatory Visit: Payer: 59 | Admitting: Family

## 2022-08-26 VITALS — BP 120/68 | HR 81 | Temp 98.7°F | Ht 72.0 in | Wt 187.0 lb

## 2022-08-26 DIAGNOSIS — M25522 Pain in left elbow: Secondary | ICD-10-CM | POA: Diagnosis not present

## 2022-08-26 DIAGNOSIS — M542 Cervicalgia: Secondary | ICD-10-CM | POA: Insufficient documentation

## 2022-08-26 DIAGNOSIS — Z8249 Family history of ischemic heart disease and other diseases of the circulatory system: Secondary | ICD-10-CM | POA: Diagnosis not present

## 2022-08-26 DIAGNOSIS — J4521 Mild intermittent asthma with (acute) exacerbation: Secondary | ICD-10-CM

## 2022-08-26 DIAGNOSIS — M62838 Other muscle spasm: Secondary | ICD-10-CM | POA: Diagnosis not present

## 2022-08-26 DIAGNOSIS — E785 Hyperlipidemia, unspecified: Secondary | ICD-10-CM

## 2022-08-26 DIAGNOSIS — Z136 Encounter for screening for cardiovascular disorders: Secondary | ICD-10-CM | POA: Diagnosis not present

## 2022-08-26 DIAGNOSIS — M47812 Spondylosis without myelopathy or radiculopathy, cervical region: Secondary | ICD-10-CM | POA: Diagnosis not present

## 2022-08-26 MED ORDER — ALBUTEROL SULFATE HFA 108 (90 BASE) MCG/ACT IN AERS: 2.0000 | INHALATION_SPRAY | Freq: Four times a day (QID) | RESPIRATORY_TRACT | 0 refills | Status: DC | PRN

## 2022-08-26 MED ORDER — IBUPROFEN 600 MG PO TABS: 600.0000 mg | ORAL_TABLET | Freq: Three times a day (TID) | ORAL | 0 refills | Status: DC | PRN

## 2022-08-26 MED ORDER — CYCLOBENZAPRINE HCL 10 MG PO TABS: 10.0000 mg | ORAL_TABLET | Freq: Three times a day (TID) | ORAL | 0 refills | Status: DC | PRN

## 2022-08-26 MED ORDER — PREDNISONE 10 MG (21) PO TBPK: ORAL_TABLET | ORAL | 0 refills | Status: DC

## 2022-08-26 NOTE — Progress Notes (Signed)
Established Patient Office Visit  Subjective:   Patient ID: Marcus Andrews, male    DOB: 1960-07-29  Age: 62 y.o. MRN: [id number]  CC:  Chief Complaint  Patient presents with   Arm Pain    Left elbow x 1 year     HPI: Marcus Andrews is a 62 y.o. male presenting on 08/26/2022 for Arm Pain (Left elbow x 1 year )   Arm Pain     Neck pain started about three weeks , no known injury that he knows of.   For work he drives, no heavy lifting. Left posterior neck pain that radiates down to his left shoulder. Never had neck pain in the past. He does report numbness and tingling that goes down to his fingers. Worse with movement.   Has not taken any tylenol and or ibuprofen. At current is a stabbing pain, and comes and go mainly with movement.   No fever . He does state inflammation of left elbow pain, he states has been for months now. He denies known injury, does not lift weights.   He is requesting a stress test, no increasing doe or sob, and or chest pain. His dad has had heart attack age 47 and brother between 24-60 y/o   Also does report last week allergy was exacerbating him, he used an expired inhaler and he is requesting a refill        ROS: Negative unless specifically indicated above in HPI.   Relevant past medical history reviewed and updated as indicated.   Allergies and medications reviewed and updated.   Current Outpatient Medications:    albuterol (VENTOLIN HFA) 108 (90 Base) MCG/ACT inhaler, Inhale 2 puffs into the lungs every 6 (six) hours as needed for wheezing or shortness of breath., Disp: 8 g, Rfl: 0   atorvastatin (LIPITOR) 10 MG tablet, Take 1 tablet (10 mg total) by mouth daily., Disp: 90 tablet, Rfl: 3   cyclobenzaprine (FLEXERIL) 10 MG tablet, Take 1 tablet (10 mg total) by mouth 3 (three) times daily as needed for muscle spasms., Disp: 30 tablet, Rfl: 0   glycopyrrolate (ROBINUL) 1 MG tablet, Take 1 tablet (1 mg total) by mouth 2 (two) times  daily., Disp: 60 tablet, Rfl: 11   ibuprofen (ADVIL) 600 MG tablet, Take 1 tablet (600 mg total) by mouth every 8 (eight) hours as needed., Disp: 30 tablet, Rfl: 0   traMADol (ULTRAM) 50 MG tablet, Take by mouth., Disp: , Rfl:   No Known Allergies  Objective:   BP 120/68   Pulse 81   Temp 98.7 F (37.1 C) (Temporal)   Ht 6' (1.829 m)   Wt 187 lb (84.8 kg)   SpO2 97%   BMI 25.36 kg/m    Physical Exam Constitutional:      General: He is not in acute distress.    Appearance: Normal appearance. He is normal weight. He is not ill-appearing, toxic-appearing or diaphoretic.  Cardiovascular:     Rate and Rhythm: Normal rate.  Pulmonary:     Effort: Pulmonary effort is normal.  Musculoskeletal:     Right shoulder: Bony tenderness (left ac and bottom of scapula mild tenderness) present. Normal range of motion.     Cervical back: Spasms (left posterior neck) and tenderness present. Decreased range of motion (pain with left external rotation slight with flexion).     Comments: Negative apley  Negative drop can test    Neurological:     General: No focal deficit  present.     Mental Status: He is alert and oriented to person, place, and time. Mental status is at baseline.  Psychiatric:        Mood and Affect: Mood normal.        Behavior: Behavior normal.        Thought Content: Thought content normal.        Judgment: Judgment normal.     Assessment & Plan:  Family history of heart attack Assessment & Plan: Referral placed for cardiology  Pt requesting stress test, advised to discuss further with cardiologist.  Currently asymptomatic, with hyperlipidemia.   Orders: -     Ambulatory referral to Cardiology  Screening for cardiovascular condition -     Ambulatory referral to Cardiology  Dyslipidemia -     Ambulatory referral to Cardiology  Neck pain on left side Assessment & Plan: Spasm palpated.  Ibuprofen tylenol recommended Print out given for neck exercises.  Heat  to site.  If no improvement will refer to orthospine.  Neck xray today pending results.    Mild intermittent extrinsic asthma with acute exacerbation -     Albuterol Sulfate HFA; Inhale 2 puffs into the lungs every 6 (six) hours as needed for wheezing or shortness of breath.  Dispense: 8 g; Refill: 0  Left elbow pain Assessment & Plan: Chronic with edema.  Heat to site, ibuprofen tylenol prn  Xray today pending results  Orders: -     DG Elbow 2 Views Left; Future -     Ibuprofen; Take 1 tablet (600 mg total) by mouth every 8 (eight) hours as needed.  Dispense: 30 tablet; Refill: 0  Neck pain -     DG Cervical Spine Complete; Future -     Cyclobenzaprine HCl; Take 1 tablet (10 mg total) by mouth 3 (three) times daily as needed for muscle spasms.  Dispense: 30 tablet; Refill: 0 -     Ibuprofen; Take 1 tablet (600 mg total) by mouth every 8 (eight) hours as needed.  Dispense: 30 tablet; Refill: 0  Neck muscle spasm -     Ibuprofen; Take 1 tablet (600 mg total) by mouth every 8 (eight) hours as needed.  Dispense: 30 tablet; Refill: 0     Follow up plan: Return if symptoms worsen or fail to improve.  Mort Sawyers, FNP

## 2022-08-26 NOTE — Assessment & Plan Note (Signed)
Chronic with edema.  Heat to site, ibuprofen tylenol prn  Xray today pending results

## 2022-08-26 NOTE — Assessment & Plan Note (Signed)
Spasm palpated.  Ibuprofen tylenol recommended Print out given for neck exercises.  Heat to site.  If no improvement will refer to orthospine.  Neck xray today pending results.

## 2022-08-26 NOTE — Assessment & Plan Note (Signed)
Referral placed for cardiology  Pt requesting stress test, advised to discuss further with cardiologist.  Currently asymptomatic, with hyperlipidemia.

## 2022-08-26 NOTE — Patient Instructions (Addendum)
  A referral was placed today for cardiologist.  Please let us know if you have not heard back within 2 weeks about the referral.  Recommend tylenol 500 mg and ibuprofen 600 mg together for pain every 6-8 hours.  Heat to site, lidocaine patches as needed.  Exercises to area as tolerated.    If no improvement consider seeing orthopedist.   Complete xray(s) prior to leaving today. I will notify you of your results once received.   Regards,   Mort Sawyers FNP-C

## 2022-09-14 ENCOUNTER — Other Ambulatory Visit: Payer: Self-pay | Admitting: Gastroenterology

## 2022-09-14 DIAGNOSIS — K2210 Ulcer of esophagus without bleeding: Secondary | ICD-10-CM

## 2022-09-14 DIAGNOSIS — K259 Gastric ulcer, unspecified as acute or chronic, without hemorrhage or perforation: Secondary | ICD-10-CM

## 2022-09-14 DIAGNOSIS — R1084 Generalized abdominal pain: Secondary | ICD-10-CM

## 2022-09-15 ENCOUNTER — Telehealth: Payer: Self-pay | Admitting: Gastroenterology

## 2022-09-15 MED ORDER — OMEPRAZOLE 40 MG PO CPDR: 40.0000 mg | DELAYED_RELEASE_CAPSULE | Freq: Every day | ORAL | 3 refills | Status: DC

## 2022-09-15 NOTE — Telephone Encounter (Signed)
Inbound call from patient needing medication refill on protonix. Please advise.  Verified pharmacy [facility] in [place].  Please advise.

## 2022-09-15 NOTE — Telephone Encounter (Signed)
Patient states he needs a refill of pantoprazole sent to his pharmacy. Informed patient that at his last appt with Dr. Russella Dar he was told to resume omeprazole 40 mg daily. Asked patient to clarify which medication is taking. Patient states he thought they were similar medications but worked differently so he has been taking both. Informed patient to take one or the other, not both. Patient states he will take omeprazole since he has plenty of refills.

## 2022-09-21 MED ORDER — AMOXICILLIN-POT CLAVULANATE 875-125 MG PO TABS: 1.0000 | ORAL_TABLET | Freq: Two times a day (BID) | ORAL | 0 refills | Status: DC

## 2022-09-21 NOTE — Telephone Encounter (Signed)
Augmentin 875 mg po bid with food, #14 Light diet until symptoms improve Contact us if not steadily improving Glycopyrrolate as prescribed

## 2022-09-21 NOTE — Telephone Encounter (Signed)
Inbound call from patient needing medication refill on amoxicillin? Please advise.

## 2022-09-21 NOTE — Telephone Encounter (Signed)
Informed patient that I am sending Augmentin to his pharmacy and to remain on a light diet until his symptoms improve.   Also, contact our office if your symptoms do not improve and take glycopyrrolate as needed.

## 2022-09-21 NOTE — Telephone Encounter (Signed)
Patient reports he is having a diverticulitis flare up. He reports loose stools starting yesterday (around 2 a day) with constant lower abdominal pain. Patient states he typically has the pain in the LLQ part of his abdomen but the last 2 days he feels it spread across lower abdomen. He denies a fever. Patient is requesting an antibiotic. Please advise Dr. Russella Dar.

## 2022-09-21 NOTE — Addendum Note (Signed)
Addended by: Illene Bolus on: 09/21/2022 04:12 PM   Modules accepted: Orders

## 2022-09-24 ENCOUNTER — Other Ambulatory Visit: Payer: Self-pay | Admitting: Family

## 2022-09-24 DIAGNOSIS — J4521 Mild intermittent asthma with (acute) exacerbation: Secondary | ICD-10-CM

## 2022-10-02 ENCOUNTER — Other Ambulatory Visit: Payer: Self-pay

## 2022-10-02 MED ORDER — AMOXICILLIN-POT CLAVULANATE 875-125 MG PO TABS: 1.0000 | ORAL_TABLET | Freq: Two times a day (BID) | ORAL | 0 refills | Status: DC

## 2022-10-02 NOTE — Telephone Encounter (Signed)
The pt has been advised and will pick  up prescription and call back in 1 week to update.

## 2022-10-02 NOTE — Telephone Encounter (Signed)
Inbound call from patient stating he is still have some discomfort in his lower abdomen after finishing antibiotics. Requesting a call back to discuss further. Please advise, thank you.

## 2022-10-02 NOTE — Telephone Encounter (Signed)
The pt called about 2 weeks ago with diverticulitis flare.  He was prescribed augmentin 875 BID for 14 days (he did finish the course as directed) as well as Robinul twice daily.  He states he did start to feel about 90% better but continues to have some discomfort on the lower left side.  No bleeding, no loose stools, fever or  other symptoms.  He is going to continue a light diet and Robinul (gives some relief)  He is aware that Dr Russella Dar is out of the office until Monday and will await any further recommendations.

## 2022-10-14 ENCOUNTER — Ambulatory Visit: Payer: 59 | Admitting: Internal Medicine

## 2022-11-27 DIAGNOSIS — M170 Bilateral primary osteoarthritis of knee: Secondary | ICD-10-CM | POA: Diagnosis not present

## 2022-12-01 ENCOUNTER — Inpatient Hospital Stay (HOSPITAL_COMMUNITY)
Admission: EM | Admit: 2022-12-01 | Discharge: 2022-12-09 | DRG: 392 | Disposition: A | Discharge status: Home / Self Care | Payer: 59 | Attending: Internal Medicine | Admitting: Internal Medicine

## 2022-12-01 ENCOUNTER — Other Ambulatory Visit: Payer: Self-pay

## 2022-12-01 ENCOUNTER — Encounter: Payer: Self-pay | Admitting: Emergency Medicine

## 2022-12-01 ENCOUNTER — Emergency Department
Admission: EM | Admit: 2022-12-01 | Discharge: 2022-12-01 | Disposition: A | Discharge status: Other Acute Inpatient Hospital | Payer: 59 | Attending: Emergency Medicine | Admitting: Emergency Medicine

## 2022-12-01 ENCOUNTER — Emergency Department: Payer: 59

## 2022-12-01 ENCOUNTER — Telehealth: Payer: Self-pay | Admitting: Gastroenterology

## 2022-12-01 DIAGNOSIS — R1084 Generalized abdominal pain: Secondary | ICD-10-CM | POA: Diagnosis not present

## 2022-12-01 DIAGNOSIS — K5720 Diverticulitis of large intestine with perforation and abscess without bleeding: Secondary | ICD-10-CM | POA: Diagnosis not present

## 2022-12-01 DIAGNOSIS — R1011 Right upper quadrant pain: Secondary | ICD-10-CM | POA: Diagnosis not present

## 2022-12-01 DIAGNOSIS — Z800 Family history of malignant neoplasm of digestive organs: Secondary | ICD-10-CM

## 2022-12-01 DIAGNOSIS — Z833 Family history of diabetes mellitus: Secondary | ICD-10-CM

## 2022-12-01 DIAGNOSIS — M1990 Unspecified osteoarthritis, unspecified site: Secondary | ICD-10-CM | POA: Diagnosis present

## 2022-12-01 DIAGNOSIS — Z83438 Family history of other disorder of lipoprotein metabolism and other lipidemia: Secondary | ICD-10-CM | POA: Diagnosis not present

## 2022-12-01 DIAGNOSIS — K7689 Other specified diseases of liver: Secondary | ICD-10-CM | POA: Diagnosis not present

## 2022-12-01 DIAGNOSIS — R3915 Urgency of urination: Secondary | ICD-10-CM | POA: Insufficient documentation

## 2022-12-01 DIAGNOSIS — R1032 Left lower quadrant pain: Secondary | ICD-10-CM | POA: Diagnosis not present

## 2022-12-01 DIAGNOSIS — Z86010 Personal history of colonic polyps: Secondary | ICD-10-CM

## 2022-12-01 DIAGNOSIS — E785 Hyperlipidemia, unspecified: Secondary | ICD-10-CM | POA: Diagnosis not present

## 2022-12-01 DIAGNOSIS — Z79899 Other long term (current) drug therapy: Secondary | ICD-10-CM

## 2022-12-01 DIAGNOSIS — R7303 Prediabetes: Secondary | ICD-10-CM | POA: Diagnosis not present

## 2022-12-01 DIAGNOSIS — K5792 Diverticulitis of intestine, part unspecified, without perforation or abscess without bleeding: Principal | ICD-10-CM

## 2022-12-01 DIAGNOSIS — K5732 Diverticulitis of large intestine without perforation or abscess without bleeding: Secondary | ICD-10-CM | POA: Diagnosis not present

## 2022-12-01 DIAGNOSIS — K5730 Diverticulosis of large intestine without perforation or abscess without bleeding: Secondary | ICD-10-CM | POA: Diagnosis not present

## 2022-12-01 DIAGNOSIS — Z87891 Personal history of nicotine dependence: Secondary | ICD-10-CM | POA: Diagnosis not present

## 2022-12-01 DIAGNOSIS — R188 Other ascites: Secondary | ICD-10-CM | POA: Diagnosis not present

## 2022-12-01 LAB — CBC WITH DIFFERENTIAL/PLATELET
Abs Immature Granulocytes: 0.03 10*3/uL (ref 0.00–0.07)
Basophils Absolute: 0 10*3/uL (ref 0.0–0.1)
Basophils Relative: 0 %
Eosinophils Absolute: 0 10*3/uL (ref 0.0–0.5)
Eosinophils Relative: 0 %
HCT: 45.1 % (ref 39.0–52.0)
Hemoglobin: 14.2 g/dL (ref 13.0–17.0)
Immature Granulocytes: 0 %
Lymphocytes Relative: 12 %
Lymphs Abs: 1.4 10*3/uL (ref 0.7–4.0)
MCH: 26.1 pg (ref 26.0–34.0)
MCHC: 31.5 g/dL (ref 30.0–36.0)
MCV: 82.8 fL (ref 80.0–100.0)
Monocytes Absolute: 0.8 10*3/uL (ref 0.1–1.0)
Monocytes Relative: 7 %
Neutro Abs: 9.1 10*3/uL — ABNORMAL HIGH (ref 1.7–7.7)
Neutrophils Relative %: 81 %
Platelets: 281 10*3/uL (ref 150–400)
RBC: 5.45 MIL/uL (ref 4.22–5.81)
RDW: 16.2 % — ABNORMAL HIGH (ref 11.5–15.5)
WBC: 11.4 10*3/uL — ABNORMAL HIGH (ref 4.0–10.5)
nRBC: 0 % (ref 0.0–0.2)

## 2022-12-01 LAB — URINALYSIS, ROUTINE W REFLEX MICROSCOPIC
Bacteria, UA: NONE SEEN
Bilirubin Urine: NEGATIVE
Glucose, UA: NEGATIVE mg/dL
Ketones, ur: NEGATIVE mg/dL
Leukocytes,Ua: NEGATIVE
Nitrite: NEGATIVE
Protein, ur: NEGATIVE mg/dL
Specific Gravity, Urine: 1.016 (ref 1.005–1.030)
pH: 6 (ref 5.0–8.0)

## 2022-12-01 LAB — COMPREHENSIVE METABOLIC PANEL
ALT: 15 U/L (ref 0–44)
AST: 15 U/L (ref 15–41)
Albumin: 4 g/dL (ref 3.5–5.0)
Alkaline Phosphatase: 84 U/L (ref 38–126)
Anion gap: 11 (ref 5–15)
BUN: 16 mg/dL (ref 6–20)
CO2: 22 mmol/L (ref 22–32)
Calcium: 8.7 mg/dL — ABNORMAL LOW (ref 8.9–10.3)
Chloride: 104 mmol/L (ref 98–111)
Creatinine, Ser: 0.74 mg/dL (ref 0.61–1.24)
GFR, Estimated: >60 mL/min (ref >60)
Glucose, Bld: 95 mg/dL (ref 70–99)
Potassium: 3.6 mmol/L (ref 3.5–5.1)
Sodium: 137 mmol/L (ref 135–145)
Total Bilirubin: 1.2 mg/dL (ref 0.3–1.2)
Total Protein: 7.4 g/dL (ref 6.5–8.1)

## 2022-12-01 LAB — LIPASE, BLOOD: Lipase: 20 U/L (ref 11–51)

## 2022-12-01 MED ORDER — METRONIDAZOLE 500 MG/100ML IV SOLN
500.0000 mg | Freq: Once | INTRAVENOUS | Status: DC
  Administered 2022-12-01: 500 mg via INTRAVENOUS
  Filled 2022-12-01: qty 100

## 2022-12-01 MED ORDER — IBUPROFEN 800 MG PO TABS: 800.0000 mg | ORAL_TABLET | Freq: Once | ORAL | Status: DC

## 2022-12-01 MED ORDER — ACETAMINOPHEN 325 MG PO TABS
650.0000 mg | ORAL_TABLET | Freq: Four times a day (QID) | ORAL | Status: DC | PRN
  Administered 2022-12-06: 650 mg via ORAL
  Filled 2022-12-01: qty 2

## 2022-12-01 MED ORDER — ONDANSETRON HCL 4 MG/2ML IJ SOLN
4.0000 mg | Freq: Four times a day (QID) | INTRAMUSCULAR | Status: DC | PRN
  Administered 2022-12-02: 4 mg via INTRAVENOUS
  Filled 2022-12-01: qty 2

## 2022-12-01 MED ORDER — MORPHINE SULFATE (PF) 4 MG/ML IV SOLN
4.0000 mg | Freq: Once | INTRAVENOUS | Status: AC
  Administered 2022-12-01: 4 mg via INTRAVENOUS
  Filled 2022-12-01: qty 1

## 2022-12-01 MED ORDER — SODIUM CHLORIDE 0.9 % IV BOLUS
1000.0000 mL | Freq: Once | INTRAVENOUS | Status: AC
  Administered 2022-12-01: 1000 mL via INTRAVENOUS

## 2022-12-01 MED ORDER — SODIUM CHLORIDE 0.9 % IV SOLN
2.0000 g | Freq: Once | INTRAVENOUS | Status: AC
  Administered 2022-12-01: 2 g via INTRAVENOUS
  Filled 2022-12-01: qty 12.5

## 2022-12-01 MED ORDER — IOHEXOL 300 MG/ML  SOLN
100.0000 mL | Freq: Once | INTRAMUSCULAR | Status: AC | PRN
  Administered 2022-12-01: 100 mL via INTRAVENOUS

## 2022-12-01 MED ORDER — SODIUM CHLORIDE 0.9 % IV SOLN: INTRAVENOUS | Status: AC

## 2022-12-01 MED ORDER — PIPERACILLIN-TAZOBACTAM 3.375 G IVPB
3.3750 g | Freq: Three times a day (TID) | INTRAVENOUS | Status: DC
  Administered 2022-12-02 – 2022-12-08 (×20): 3.375 g via INTRAVENOUS
  Filled 2022-12-01 (×24): qty 50

## 2022-12-01 MED ORDER — ONDANSETRON HCL 4 MG/2ML IJ SOLN
4.0000 mg | Freq: Once | INTRAMUSCULAR | Status: AC
  Administered 2022-12-01: 4 mg via INTRAVENOUS
  Filled 2022-12-01: qty 2

## 2022-12-01 MED ORDER — HYDROMORPHONE HCL 1 MG/ML IJ SOLN
0.5000 mg | INTRAMUSCULAR | Status: DC | PRN
  Administered 2022-12-02: 1 mg via INTRAVENOUS
  Filled 2022-12-01: qty 1

## 2022-12-01 MED ORDER — SODIUM CHLORIDE 0.9 % IV SOLN: 2.0000 g | Freq: Once | INTRAVENOUS | Status: DC

## 2022-12-01 MED ORDER — HYDROMORPHONE HCL 1 MG/ML IJ SOLN
1.0000 mg | Freq: Once | INTRAMUSCULAR | Status: AC
  Administered 2022-12-01: 1 mg via INTRAVENOUS
  Filled 2022-12-01: qty 1

## 2022-12-01 MED ORDER — METRONIDAZOLE 500 MG/100ML IV SOLN: 500.0000 mg | Freq: Two times a day (BID) | INTRAVENOUS | Status: DC

## 2022-12-01 MED ORDER — METRONIDAZOLE 500 MG/100ML IV SOLN
500.0000 mg | Freq: Once | INTRAVENOUS | Status: AC
  Administered 2022-12-01: 500 mg via INTRAVENOUS
  Filled 2022-12-01: qty 100

## 2022-12-01 MED ORDER — ONDANSETRON HCL 4 MG PO TABS: 4.0000 mg | ORAL_TABLET | Freq: Four times a day (QID) | ORAL | Status: DC | PRN

## 2022-12-01 MED ORDER — ACETAMINOPHEN 650 MG RE SUPP: 650.0000 mg | Freq: Four times a day (QID) | RECTAL | Status: DC | PRN

## 2022-12-01 NOTE — ED Provider Notes (Signed)
[facility] Emergency Department Provider Note     Event Date/Time   First MD Initiated Contact with Patient 12/01/22 1607     (approximate)   History   Abdominal Pain   HPI  Marcus Andrews is a 62 y.o. male with a history of GERD, diverticulosis, colon polyps, and otherwise, presents to the ED for generalized (lower) abdominal pain, right upper quadrant abdominal pain as well as arise abdominal "cramping".  He notes onset since Sunday, but denies any associated N/V/D.  He would endorse urinary urgency but denies any bladder or bowel incontinence, foot drop, or saddle anesthesia.  Physical Exam   Triage Vital Signs: ED Triage Vitals  Encounter Vitals Group     BP 12/01/22 1308 (!) 147/98     Systolic BP Percentile --      Diastolic BP Percentile --      Pulse Rate 12/01/22 1308 76     Resp 12/01/22 1308 18     Temp 12/01/22 1308 98.8 F (37.1 C)     Temp Source 12/01/22 1308 Oral     SpO2 12/01/22 1308 98 %     Weight 12/01/22 1309 182 lb (82.6 kg)     Height 12/01/22 1309 6' (1.829 m)     Head Circumference --      Peak Flow --      Pain Score 12/01/22 1309 9     Pain Loc --      Pain Education --      Exclude from Growth Chart --     Most recent vital signs: Vitals:   12/01/22 1842 12/01/22 1955  BP:  (!) 141/98  Pulse:  68  Resp:  18  Temp: 98.1 F (36.7 C) 99 F (37.2 C)  SpO2:  97%    General Awake, no distress. NAD HEENT NCAT. PERRL. EOMI. No rhinorrhea. Mucous membranes are moist.  CV:  Good peripheral perfusion.  RESP:  Normal effort. CTA ABD:  No distention. General abdominal tenderness to palpation over all quadrants   ED Results / Procedures / Treatments   Labs (all labs ordered are listed, but only abnormal results are displayed) Labs Reviewed  CBC WITH DIFFERENTIAL/PLATELET - Abnormal; Notable for the following components:      Result Value   WBC 11.4 (*)    RDW 16.2 (*)    Neutro Abs 9.1 (*)    All other  components within normal limits  COMPREHENSIVE METABOLIC PANEL - Abnormal; Notable for the following components:   Calcium 8.7 (*)    All other components within normal limits  URINALYSIS, ROUTINE W REFLEX MICROSCOPIC - Abnormal; Notable for the following components:   Color, Urine YELLOW (*)    APPearance CLEAR (*)    Hgb urine dipstick SMALL (*)    All other components within normal limits  LIPASE, BLOOD     EKG   RADIOLOGY  I personally viewed and evaluated these images as part of my medical decision making, as well as reviewing the written report by the radiologist.  ED Provider Interpretation: Acute sigmoid diverticulitis with evidence of perforation without focal abscess.  Free air throughout the abdomen and pelvis noted  CT ABDOMEN PELVIS W CONTRAST  Result Date: 12/01/2022 CLINICAL DATA:  Bilateral lower abdominal pain EXAM: CT ABDOMEN AND PELVIS WITH CONTRAST TECHNIQUE: Multidetector CT imaging of the abdomen and pelvis was performed using the standard protocol following bolus administration of intravenous contrast. RADIATION DOSE REDUCTION: This exam was performed  according to the departmental dose-optimization program which includes automated exposure control, adjustment of the mA and/or kV according to patient size and/or use of iterative reconstruction technique. CONTRAST:  OMNIPAQUE IOHEXOL 300 MG/ML  SOLN COMPARISON:  10/24/2021 FINDINGS: Lower chest: Bibasilar scarring. No acute pleural or parenchymal lung disease. Hepatobiliary: Stable hepatic cysts. No other focal liver abnormality. The gallbladder is unremarkable. No biliary duct dilation. Pancreas: Unremarkable. No pancreatic ductal dilatation or surrounding inflammatory changes. Spleen: Normal in size without focal abnormality. Adrenals/Urinary Tract: Adrenal glands are unremarkable. Kidneys are normal, without renal calculi, focal lesion, or hydronephrosis. Bladder is unremarkable. Stomach/Bowel: Distal colonic  diverticulosis, with long segment wall thickening and pericolonic fat stranding involving the sigmoid colon consistent with acute diverticulitis. There is evidence of sigmoid colon perforation, with free gas throughout the abdomen and pelvis. There is a loculated gas collection overlying the left iliac vessels measuring 3.1 x 2.0 cm, but I do not see any fluid collection or abscess at this time. Normal appendix right lower quadrant. No bowel obstruction. Scattered gas fluid levels within the distal jejunum may reflect ileus given the adjacent perforated sigmoid diverticulitis. Vascular/Lymphatic: Aortic atherosclerosis. No enlarged abdominal or pelvic lymph nodes. Reproductive: Prostate is unremarkable. Other: Pneumoperitoneum throughout the abdomen and pelvis consistent with perforated sigmoid diverticulitis. Trace pelvic free fluid. No abdominal wall hernia. Musculoskeletal: No acute or destructive bony abnormalities. Reconstructed images demonstrate no additional findings. IMPRESSION: 1. Acute perforated sigmoid diverticulitis. Diffuse pneumoperitoneum, with loculated gas collection overlying the left iliac vessels as above. There is no drainable fluid collection or abscess at this time. 2. Scattered gas fluid levels within the distal jejunum, likely representing ileus given the inflammatory changes of the sigmoid colon described above. 3.  Aortic Atherosclerosis (ICD10-I70.0). Critical Value/emergent results were called by telephone at the time of interpretation on 12/01/2022 at 6:14 pm to provider Rainn Zupko , who verbally acknowledged these results. Electronically Signed   By: Sharlet Salina M.D.   On: 12/01/2022 18:20   US Abdomen Limited RUQ (LIVER/GB)  Result Date: 12/01/2022 CLINICAL DATA:  Right upper quadrant pain EXAM: ULTRASOUND ABDOMEN LIMITED RIGHT UPPER QUADRANT COMPARISON:  CT abdomen pelvis 10/24/2021 FINDINGS: Gallbladder: No gallstones or wall thickening visualized. No sonographic Murphy  sign noted by sonographer. Common bile duct: Diameter: 3.4 mm Liver: No focal lesion identified. Within normal limits in parenchymal echogenicity. Portal vein is patent on color Doppler imaging with normal direction of blood flow towards the liver. Other: None. IMPRESSION: No cholelithiasis or sonographic evidence for acute cholecystitis. Electronically Signed   By: Annia Belt M.D.   On: 12/01/2022 14:35     PROCEDURES:  Critical Care performed: Yes, see critical care procedure note(s)  Procedures   MEDICATIONS ORDERED IN ED: Medications  ondansetron (ZOFRAN) injection 4 mg (4 mg Intravenous Given 12/01/22 1724)  sodium chloride 0.9 % bolus 1,000 mL (0 mLs Intravenous Stopped 12/01/22 1836)  iohexol (OMNIPAQUE) 300 MG/ML solution 100 mL (100 mLs Intravenous Contrast Given 12/01/22 1735)  morphine (PF) 4 MG/ML injection 4 mg (4 mg Intravenous Given 12/01/22 1816)  metroNIDAZOLE (FLAGYL) IVPB 500 mg (500 mg Intravenous Transfusing/Transfer 12/01/22 2012)  ceFEPIme (MAXIPIME) 2 g in sodium chloride 0.9 % 100 mL IVPB (0 g Intravenous Stopped 12/01/22 1919)  HYDROmorphone (DILAUDID) injection 1 mg (1 mg Intravenous Given 12/01/22 1940)     IMPRESSION / MDM / ASSESSMENT AND PLAN / ED COURSE  I reviewed the triage vital signs and the nursing notes.  Differential diagnosis includes, but is not limited to, acute appendicitis, renal colic, testicular torsion, urinary tract infection/pyelonephritis, prostatitis,  epididymitis, diverticulitis, small bowel obstruction or ileus, colitis, abdominal aortic aneurysm, gastroenteritis, hernia, etc.  Patient's presentation is most consistent with acute presentation with potential threat to life or bodily function.  ----------------------------------------- 6:36 PM on 12/01/2022 ----------------------------------------- S/W [place]: He reviewed the images after prescription out of the case he was actively managing.  He advised that  both he and Dr. Tonna Boehringer had emergent cases being managed right now, and he was worried that he would not be able to get to this patient who also needs emergent intervention in a timely manner.  He suggested that I reach out to [facility] of the facility for transfer.  ----------------------------------------- 7:12 PM on 12/01/2022 ----------------------------------------- S/W Dr. Melody Haver [facility]) general surgery attending accepts the patient in transfer.  He suggested ED to ED transfer.  ----------------------------------------- 7:23 PM on 12/01/2022 ----------------------------------------- S/W Dr. Dan Humphreys [facility]) ED attending, accepts the patient as discussed in transfer.  Patient's diagnosis is consistent with acute sigmoid diverticulitis with perforation. Patient will be transferred via EMS ED to ED to [facility] for urgent surgical intervention as discussed. Patient is understanding of his diagnosis and need for surgical intervention.  He is agreeable to the plan at this time for transfer.  Patient's pain is currently controlled with IV Dilaudid.  He completed a dose of IV cefepime and IV Flagyl is currently running.  FINAL CLINICAL IMPRESSION(S) / ED DIAGNOSES   Final diagnoses:  Diverticulitis of large intestine with perforation without bleeding     Rx / DC Orders   ED Discharge Orders     None        Note:  This document was prepared using Dragon voice recognition software and may include unintentional dictation errors.    Lissa Hoard, [name] 12/01/22 2044    Merwyn Katos, MD 12/01/22 [phone]

## 2022-12-01 NOTE — Telephone Encounter (Signed)
Inbound call from patient states he is experiencing lower abdominal pain and would like to speak with a nurse . Please advise.   Thank you

## 2022-12-01 NOTE — ED Notes (Signed)
[place] reviewed by this RN

## 2022-12-01 NOTE — Telephone Encounter (Signed)
Called to speak with pt and he states he is currently on the way to the ER due to the abd pain he is having.

## 2022-12-01 NOTE — H&P (Signed)
Marcus Andrews:[id number] DOB: [birth year] DOA: 12/01/2022     PCP: Mort Sawyers, FNP   Outpatient Specialists:      GI  Dr. Anselm Jungling ( [facility])    Patient arrived to ER on 12/01/22 at 2039 Referred by Attending Gwyneth Sprout, MD    Patient coming from:    home Lives alone     Chief Complaint:   Chief Complaint  Patient presents with   Diverticulitis    HPI: Marcus Andrews is a 62 y.o. male with medical history significant of diverticulitis    Presented with   abd pain  2 day abd pain  Left/right lower quadrant Hx of diverticulitis in the past Started on Flagyl and cefepime  Has a micro perf General Surgery felt no indication for surgical intervention today  Denies any fever no chills no CP     Denies significant ETOH intake   Does not smoke   Lab Results  Component Value Date   SARSCOV2NAA NEGATIVE 02/02/2020   SARSCOV2NAA NEGATIVE 06/25/2019        Regarding pertinent Chronic problems:     Hyperlipidemia - *on statins {statin:315258}  Lipid Panel     Component Value Date/Time   CHOL 234 (H) 07/15/2022 1004   TRIG 79.0 07/15/2022 1004   HDL 64.90 07/15/2022 1004   CHOLHDL 4 07/15/2022 1004   VLDL 15.8 07/15/2022 1004   LDLCALC 153 (H) 07/15/2022 1004        While in ER:    CT showed diverticultis with  perf Starte on cefepime and flagyl    Lab Orders  No laboratory test(s) ordered today    [place] no cholelithiasis or sonographic evidence for acute cholecystitis.   CTabd/pelvis - . Acute perforated sigmoid diverticulitis. Diffuse pneumoperitoneum, with loculated gas collection overlying the left iliac vessels as above. There is no drainable fluid collection or abscess at this time. 2. Scattered gas fluid levels within the distal jejunum, likely representing ileus given the inflammatory changes of the sigmoid colon described above. 3.  Aortic Atherosclerosis   Following Medications were ordered in ER: Medications  ceFEPIme (MAXIPIME)  2 g in sodium chloride 0.9 % 100 mL IVPB (has no administration in time range)    And  metroNIDAZOLE (FLAGYL) IVPB 500 mg (has no administration in time range)    _______________________________________________________ ER Provider Called:    General Surgery   Dr. Josetta Huddle They Recommend admit to medicine    SEEN in ER repeatedly     ED Triage Vitals  Encounter Vitals Group     BP 12/01/22 2056 (!) 135/90     Systolic BP Percentile --      Diastolic BP Percentile --      Pulse Rate 12/01/22 2056 73     Resp 12/01/22 2056 (!) 23     Temp 12/01/22 2056 99.1 F (37.3 C)     Temp src --      SpO2 12/01/22 2056 98 %     Weight 12/01/22 2052 182 lb (82.6 kg)     Height 12/01/22 2052 6' (1.829 m)     Head Circumference --      Peak Flow --      Pain Score 12/01/22 2052 5     Pain Loc --      Pain Education --      Exclude from Growth Chart --   WJXB(14)@     _________________________________________ Significant initial  Findings: Abnormal Labs Reviewed - No abnormal labs  to display     ECG: Ordered    The recent clinical data is shown below. Vitals:   12/01/22 2052 12/01/22 2056 12/01/22 2100  BP:  (!) 135/90 (!) 145/90  Pulse:  73   Resp:  (!) 23 (!) 22  Temp:  99.1 F (37.3 C)   SpO2:  98%   Weight: 82.6 kg    Height: 6' (1.829 m)      WBC     Component Value Date/Time   WBC 11.4 (H) 12/01/2022 1316   LYMPHSABS 1.4 12/01/2022 1316   MONOABS 0.8 12/01/2022 1316   EOSABS 0.0 12/01/2022 1316   BASOSABS 0.0 12/01/2022 1316     Procalcitonin   Ordered      UA   no evidence of UTI      Urine analysis:    Component Value Date/Time   COLORURINE YELLOW (A) 12/01/2022 1701   APPEARANCEUR CLEAR (A) 12/01/2022 1701   APPEARANCEUR Clear 07/16/2021 1510   LABSPEC 1.016 12/01/2022 1701   PHURINE 6.0 12/01/2022 1701   GLUCOSEU NEGATIVE 12/01/2022 1701   HGBUR SMALL (A) 12/01/2022 1701   BILIRUBINUR NEGATIVE 12/01/2022 1701   BILIRUBINUR Negative 07/16/2021 1510    KETONESUR NEGATIVE 12/01/2022 1701   PROTEINUR NEGATIVE 12/01/2022 1701   NITRITE NEGATIVE 12/01/2022 1701   LEUKOCYTESUR NEGATIVE 12/01/2022 1701    Results for orders placed or performed in visit on 07/15/22  Chlamydia/Gonococcus/Trichomonas, NAA     Status: None   Collection Time: 07/15/22 11:18 AM   Specimen: Blood   BLD  Result Value Ref Range Status   Chlamydia by NAA Negative Negative Final   Gonococcus by NAA Negative Negative Final   Trich vag by NAA Negative Negative Final    ABX started Antibiotics Given (last 72 hours)     Date/Time Action Medication Dose Rate   12/01/22 2250 New Bag/Given   ceFEPIme (MAXIPIME) 2 g in sodium chloride 0.9 % 100 mL IVPB 2 g 200 mL/hr   12/01/22 2301 New Bag/Given   metroNIDAZOLE (FLAGYL) IVPB 500 mg 500 mg 100 mL/hr       No results found for the last 90 days.   __________________________________________________________ Recent Labs  Lab 12/01/22 1316  NA 137  K 3.6  CO2 22  GLUCOSE 95  BUN 16  CREATININE 0.74  CALCIUM 8.7*    Cr    stable,   Lab Results  Component Value Date   CREATININE 0.74 12/01/2022   CREATININE 1.03 07/15/2022   CREATININE 0.89 10/23/2021    Recent Labs  Lab 12/01/22 1316  AST 15  ALT 15  ALKPHOS 84  BILITOT 1.2  PROT 7.4  ALBUMIN 4.0   Lab Results  Component Value Date   CALCIUM 8.7 (L) 12/01/2022    Plt: Lab Results  Component Value Date   PLT 281 12/01/2022       Recent Labs  Lab 12/01/22 1316  WBC 11.4*  NEUTROABS 9.1*  HGB 14.2  HCT 45.1  MCV 82.8  PLT 281    HG/HCT  stable,       Component Value Date/Time   HGB 14.2 12/01/2022 1316   HCT 45.1 12/01/2022 1316   MCV 82.8 12/01/2022 1316     Recent Labs  Lab 12/01/22 1316  LIPASE 20     _______________________________________________ Hospitalist was called for admission for   Acute diverticulitis    The following Work up has been ordered so far:  Orders Placed This Encounter  Procedures  Consult to general surgery   Consult to hospitalist     OTHER Significant initial  Findings:  labs showing:     DM  labs:  HbA1C: Recent Labs    07/15/22 1004  HGBA1C 6.1       CBG (last 3)  No results for input(s): "GLUCAP" in the last 72 hours.        Cultures:    Component Value Date/Time   SDES BLOOD LEFT ANTECUBITAL 10/23/2021 2023   SPECREQUEST  10/23/2021 2023    BOTTLES DRAWN AEROBIC AND ANAEROBIC Blood Culture results may not be optimal due to an inadequate volume of blood received in culture bottles   CULT  10/23/2021 2023    NO GROWTH 5 DAYS Performed at [facility], [address] [place] [place], [place] [address]    REPTSTATUS 10/28/2021 FINAL 10/23/2021 2023     Radiological Exams on Admission: CT ABDOMEN PELVIS W CONTRAST  Result Date: 12/01/2022 CLINICAL DATA:  Bilateral lower abdominal pain EXAM: CT ABDOMEN AND PELVIS WITH CONTRAST TECHNIQUE: Multidetector CT imaging of the abdomen and pelvis was performed using the standard protocol following bolus administration of intravenous contrast. RADIATION DOSE REDUCTION: This exam was performed according to the departmental dose-optimization program which includes automated exposure control, adjustment of the mA and/or kV according to patient size and/or use of iterative reconstruction technique. CONTRAST:  OMNIPAQUE IOHEXOL 300 MG/ML  SOLN COMPARISON:  10/24/2021 FINDINGS: Lower chest: Bibasilar scarring. No acute pleural or parenchymal lung disease. Hepatobiliary: Stable hepatic cysts. No other focal liver abnormality. The gallbladder is unremarkable. No biliary duct dilation. Pancreas: Unremarkable. No pancreatic ductal dilatation or surrounding inflammatory changes. Spleen: Normal in size without focal abnormality. Adrenals/Urinary Tract: Adrenal glands are unremarkable. Kidneys are normal, without renal calculi, focal lesion, or hydronephrosis. Bladder is unremarkable. Stomach/Bowel: Distal colonic  diverticulosis, with long segment wall thickening and pericolonic fat stranding involving the sigmoid colon consistent with acute diverticulitis. There is evidence of sigmoid colon perforation, with free gas throughout the abdomen and pelvis. There is a loculated gas collection overlying the left iliac vessels measuring 3.1 x 2.0 cm, but I do not see any fluid collection or abscess at this time. Normal appendix right lower quadrant. No bowel obstruction. Scattered gas fluid levels within the distal jejunum may reflect ileus given the adjacent perforated sigmoid diverticulitis. Vascular/Lymphatic: Aortic atherosclerosis. No enlarged abdominal or pelvic lymph nodes. Reproductive: Prostate is unremarkable. Other: Pneumoperitoneum throughout the abdomen and pelvis consistent with perforated sigmoid diverticulitis. Trace pelvic free fluid. No abdominal wall hernia. Musculoskeletal: No acute or destructive bony abnormalities. Reconstructed images demonstrate no additional findings. IMPRESSION: 1. Acute perforated sigmoid diverticulitis. Diffuse pneumoperitoneum, with loculated gas collection overlying the left iliac vessels as above. There is no drainable fluid collection or abscess at this time. 2. Scattered gas fluid levels within the distal jejunum, likely representing ileus given the inflammatory changes of the sigmoid colon described above. 3.  Aortic Atherosclerosis (ICD10-I70.0). Critical Value/emergent results were called by telephone at the time of interpretation on 12/01/2022 at 6:14 pm to provider JENISE MENSHEW , who verbally acknowledged these results. Electronically Signed   By: Sharlet Salina M.D.   On: 12/01/2022 18:20   US Abdomen Limited RUQ (LIVER/GB)  Result Date: 12/01/2022 CLINICAL DATA:  Right upper quadrant pain EXAM: ULTRASOUND ABDOMEN LIMITED RIGHT UPPER QUADRANT COMPARISON:  CT abdomen pelvis 10/24/2021 FINDINGS: Gallbladder: No gallstones or wall thickening visualized. No sonographic Murphy  sign noted by sonographer. Common bile duct: Diameter: 3.4 mm Liver:  No focal lesion identified. Within normal limits in parenchymal echogenicity. Portal vein is patent on color Doppler imaging with normal direction of blood flow towards the liver. Other: None. IMPRESSION: No cholelithiasis or sonographic evidence for acute cholecystitis. Electronically Signed   By: Annia Belt M.D.   On: 12/01/2022 14:35   _______________________________________________________________________________________________________ Latest  Blood pressure (!) 145/90, pulse 73, temperature 99.1 F (37.3 C), resp. rate (!) 22, height 6' (1.829 m), weight 82.6 kg, SpO2 98%.   Vitals  labs and radiology finding personally reviewed  Review of Systems:    Pertinent positives include:  fatigue,abdominal pain,  Constitutional:  No weight loss, night sweats, Fevers, chills, weight loss  HEENT:  No headaches, Difficulty swallowing,Tooth/dental problems,Sore throat,  No sneezing, itching, ear ache, nasal congestion, post nasal drip,  Cardio-vascular:  No chest pain, Orthopnea, PND, anasarca, dizziness, palpitations.no Bilateral lower extremity swelling  GI:  No heartburn, indigestion,  nausea, vomiting, diarrhea, change in bowel habits, loss of appetite, melena, blood in stool, hematemesis Resp:  no shortness of breath at rest. No dyspnea on exertion, No excess mucus, no productive cough, No non-productive cough, No coughing up of blood.No change in color of mucus.No wheezing. Skin:  no rash or lesions. No jaundice GU:  no dysuria, change in color of urine, no urgency or frequency. No straining to urinate.  No flank pain.  Musculoskeletal:  No joint pain or no joint swelling. No decreased range of motion. No back pain.  Psych:  No change in mood or affect. No depression or anxiety. No memory loss.  Neuro: no localizing neurological complaints, no tingling, no weakness, no double vision, no gait abnormality, no slurred  speech, no confusion  All systems reviewed and apart from HOPI all are negative _______________________________________________________________________________________________ Past Medical History:   Past Medical History:  Diagnosis Date   Arthritis    Colon polyp    Diverticulosis    Refusal of blood transfusions as patient is Jehovah's Witness       Past Surgical History:  Procedure Laterality Date   COLONOSCOPY     KNEE ARTHROSCOPY WITH MEDIAL MENISECTOMY Right 02/05/2020   Procedure: Right knee arthroscopy with partial medial and lateral menisectomy, chondroplasty, partial synovectomy, removal loose body;  Surgeon: Lyndle Herrlich, MD;  Location: [facility] ORS;  Service: Orthopedics;  Laterality: Right;   MENISCUS REPAIR Left 01/2014   TOOTH EXTRACTION     UPPER GASTROINTESTINAL ENDOSCOPY  05/29/2021    Social History:  Ambulatory   independently       reports that he quit smoking about 24 years ago. His smoking use included cigarettes. He started smoking about 62 years ago. He has a 19 pack-year smoking history. He has never used smokeless tobacco. He reports that he does not currently use alcohol after a past usage of about 1.0 standard drink of alcohol per week. He reports that he does not use drugs.     Family History:   Family History  Problem Relation Age of Onset   Breast cancer Mother    Colon cancer Mother 57   Colon polyps Mother    Lymphoma Mother    Heart disease Father    Heart failure Father    Heart attack Father 38   Hypertension Sister    Hyperlipidemia Sister    Asthma Sister    Aneurysm Sister    Hypertension Brother    Hyperlipidemia Brother    Deep vein thrombosis Brother    Heart attack Brother  50-60   Hypertension Maternal Grandmother    Diabetes Maternal Grandmother    Hypertension Maternal Aunt    Cancer Maternal Aunt        type unknown   Hypertension Maternal Uncle    Liver cancer Maternal Uncle    Hypertension Paternal  Aunt    Hypertension Paternal Uncle    Esophageal cancer Neg Hx    Rectal cancer Neg Hx    Stomach cancer Neg Hx    ______________________________________________________________________________________________ Allergies: No Known Allergies   Prior to Admission medications   Medication Sig Start Date End Date Taking? Authorizing Provider  albuterol (VENTOLIN HFA) 108 (90 Base) MCG/ACT inhaler TAKE 2 PUFFS BY MOUTH EVERY 6 HOURS AS NEEDED FOR WHEEZE OR SHORTNESS OF BREATH 09/24/22  Yes Dugal, Tabitha, FNP  atorvastatin (LIPITOR) 10 MG tablet Take 1 tablet (10 mg total) by mouth daily. 07/23/22  Yes Dugal, Wyatt Mage, FNP  glycopyrrolate (ROBINUL) 1 MG tablet Take 1 tablet (1 mg total) by mouth 2 (two) times daily. 04/21/22  Yes Meryl Dare, MD  ibuprofen (ADVIL) 600 MG tablet Take 1 tablet (600 mg total) by mouth every 8 (eight) hours as needed. 08/26/22  Yes Dugal, Wyatt Mage, FNP  omeprazole (PRILOSEC) 40 MG capsule Take 40 mg by mouth daily.   Yes [provider]  traMADol (ULTRAM) 50 MG tablet Take 50 mg by mouth daily as needed (for knee pain). 06/11/20  Yes [provider]    ___________________________________________________________________________________________________ Physical Exam:    12/01/2022    9:00 PM 12/01/2022    8:56 PM 12/01/2022    8:52 PM  Vitals with BMI  Height   6\' 0"   Weight   182 lbs  BMI   24.68  Systolic 145 135   Diastolic 90 90   Pulse  73      1. General:  in No  Acute distress   Chronically ill   -appearing 2. Psychological: Alert and   Oriented 3. Head/ENT: Dry Mucous Membranes                          Head Non traumatic, neck supple                          Poor Dentition 4. SKIN:  decreased Skin turgor,  Skin clean Dry and intact no rash    5. Heart: Regular rate and rhythm no  Murmur, no Rub or gallop 6. Lungs:   no wheezes or crackles   7. Abdomen: Soft,  bilater abd-tender, Non distended  bowel sounds present 8. Lower  extremities: no clubbing, cyanosis, no  edema 9. Neurologically Grossly intact, moving all 4 extremities equally   10. MSK: Normal range of motion    Chart has been reviewed  ______________________________________________________________________________________________  Assessment/Plan  ***  Admitted for   Acute diverticulitis      Present on Admission: **None**     No problem-specific Assessment & Plan notes found for this encounter.    Other plan as per orders.  DVT prophylaxis:  SCD *** Lovenox       Code Status:    Code Status: Prior FULL CODE *** DNR/DNI ***comfort care as per patient ***family  I had personally discussed CODE STATUS with patient and family*  ACP *** none has been reviewed ***   Family Communication:   Family not at  Bedside  plan of care was discussed on the phone with ***  Son, Daughter, Wife, Husband, Sister, Brother , father, mother  Diet    Disposition Plan:   *** likely will need placement for rehabilitation                          Back to current facility when stable                            To home once workup is complete and patient is stable  ***Following barriers for discharge:                             Chest pain *** Stroke *** work up is complete                            Electrolytes corrected                               Anemia corrected h/H stable                             Pain controlled with PO medications                               Afebrile, white count improving able to transition to PO antibiotics                             Will need to be able to tolerate PO                            Will likely need home health, home O2, set up                           Will need consultants to evaluate patient prior to discharge       Consult Orders  (From admission, onward)           Start     Ordered   12/01/22 2158  Consult to hospitalist  Pg by Viviann Spare  Once       Provider:  (Not yet assigned)  Question  Answer Comment  Place call to: [facility]   Reason for Consult Admit      12/01/22 2157                              ***Would benefit from PT/OT eval prior to DC  Ordered                   Swallow eval - SLP ordered                   Diabetes care coordinator                   Transition of care consulted                   Nutrition    consulted  Wound care  consulted                   Palliative care    consulted                   Behavioral health  consulted                    Consults called: ***   Treatment Team:  Ccs, Md, MD  Admission status:  ED Disposition     ED Disposition  Admit   Condition  --   Comment  The patient appears reasonably stabilized for admission considering the current resources, flow, and capabilities available in the ED at this time, and I doubt any other [facility] requiring further screening and/or treatment in the ED prior to admission is  present.           Obs***  ***  inpatient     I Expect 2 midnight stay secondary to severity of patient's current illness need for inpatient interventions justified by the following: ***hemodynamic instability despite optimal treatment (tachycardia *hypotension * tachypnea *hypoxia, hypercapnia) * Severe lab/radiological/exam abnormalities including:     and extensive comorbidities including: *substance abuse  *Chronic pain *DM2  * CHF * CAD  * COPD/asthma *Morbid Obesity * CKD *dementia *liver disease *history of stroke with residual deficits *  malignancy, * sickle cell disease  History of amputation Chronic anticoagulation  That are currently affecting medical management.   I expect  patient to be hospitalized for 2 midnights requiring inpatient medical care.  Patient is at high risk for adverse outcome (such as loss of life or disability) if not treated.  Indication for inpatient stay as follows:  Severe change from baseline regarding mental  status Hemodynamic instability despite maximal medical therapy,  ongoing suicidal ideations,  severe pain requiring acute inpatient management,  inability to maintain oral hydration   persistent chest pain despite medical management Need for operative/procedural  intervention New or worsening hypoxia   Need for IV antibiotics, IV fluids, IV rate controling medications, IV antihypertensives, IV pain medications, IV anticoagulation, need for biPAP    Level of care   *** tele  For 12H 24H     medical floor       progressive     stepdown   tele indefinitely please discontinue once patient no longer qualifies COVID-19 Labs      Clarisa Danser 12/01/2022, 11:14 PM    [facility]     after 2 AM please page floor coverage PA If 7AM-7PM, please contact the day team taking care of the patient using [email or web address]

## 2022-12-01 NOTE — ED Provider Notes (Incomplete)
62 yo with h.o diverticulosis with 1 day of lower abd pain; bloating; and "cramps". CT shows sigmoid diverticulitis with perf w/o abscess.  General surgery was contacted prior to transfer from [facility] emergency department because surgery at [facility] was full with the emergent cases and unavailable to assess the patient in a timely manner.  Case discussed with Dr. Hillery Hunter who will consult on the patient.  Dr. Hillery Hunter has consulted on the patient. Can be admitted to medicine for ABX and surgery will follow.

## 2022-12-01 NOTE — ED Notes (Signed)
called to [facility]link per PA Jenise for pos transfer to [facility]/Rep:Kim @705p 

## 2022-12-01 NOTE — ED Provider Notes (Signed)
62 yo with h.o diverticulosis with 1 day of lower abd pain; bloating; and "cramps". CT shows sigmoid diverticulitis with perf w/o abscess.  General surgery was contacted prior to transfer from [facility] emergency department because surgery at [facility] was full with the emergent cases and unavailable to assess the patient in a timely manner.  Case discussed with Dr. Hillery Hunter who will consult on the patient.  Dr. Hillery Hunter has consulted on the patient. Can be admitted to medicine for ABX and surgery will follow.  Patient with perforated diverticulitis.  Seen by Dr. Hillery Hunter who recommends hospitalist admission.  Case discussed with Dr. Adela Glimpse who will consult on the patient for admission.  Patient receiving IV fluids, Flagyl and cefepime.    Arthor Captain, [name] 12/02/22 1139    Gwyneth Sprout, MD 12/03/22 0030

## 2022-12-01 NOTE — ED Provider Triage Note (Signed)
Emergency Medicine Provider Triage Evaluation Note  SAFI RYKER , a 62 y.o. male  was evaluated in triage.  Pt complains of lower abdominal pain that began on Sunday. He also has neck pain that radiates into his right arm and side. He reports his right arm as feeling cold and numb.  Review of Systems  Positive: Neck pain, right arm weakness, numbness and tingling Negative: N/V/D, fever  Physical Exam  BP 120/80   Pulse 76   Temp 98.8 F (37.1 C) (Oral)   Resp 18   SpO2 98%  Gen:   Awake, no distress   Resp:  Normal effort  MSK:   Moves extremities without difficulty  Other:    Medical Decision Making  Medically screening exam initiated at 1:09 PM.  Appropriate orders placed.  ZAYLON GARAVITO was informed that the remainder of the evaluation will be completed by another provider, this initial triage assessment does not replace that evaluation, and the importance of remaining in the ED until their evaluation is complete.    [name], [name] 12/01/22 1314

## 2022-12-01 NOTE — ED Triage Notes (Signed)
Pt arrives to [facility] from [facility] by [facility]link. Abdominal pain in left/right lower quadrants since Sunday. Hx diverticulitis, reports hole in colon with free air in abd.   Has received 500mg  flagyl, 2g cefepime, and 2L NS

## 2022-12-01 NOTE — ED Notes (Signed)
pt accepted Ed to Ed/Accepting:Dr.Plunkett/Report:[phone]/Rep:Kim

## 2022-12-01 NOTE — ED Triage Notes (Signed)
Patient reports lower abdominal pain since Sunday; denies N/V/D.

## 2022-12-01 NOTE — Progress Notes (Signed)
Pharmacy Antibiotic Note  Marcus Andrews is a 62 y.o. male admitted on 12/01/2022 with abdominal pain in left/right lower quadrants since Sunday. Patient reports history of diverticulitis. Imaging shows acute perforated sigmoid diverticulitis, no fluid collection or abscess noted.  Pharmacy has been consulted for zosyn dosing.  WBC 11.4, sCr 0.74, afebrile, received one dose of cefepime/flagyl in ED. No micro labs  Plan: Zosyn 3.375gm IV every 8 hours Monitor renal function Follow up signs of clinical improvement, LOT, de-escalation of antibiotics, surgical interventions  Height: 6' (182.9 cm) Weight: 82.6 kg (182 lb) IBW/kg (Calculated) : 77.6  Temp (24hrs), Avg:98.8 F (37.1 C), Min:98.1 F (36.7 C), Max:99.1 F (37.3 C)  Recent Labs  Lab 12/01/22 1316  WBC 11.4*  CREATININE 0.74    Estimated Creatinine Clearance: 107.8 mL/min (by C-G formula based on SCr of 0.74 mg/dL).    No Known Allergies  Antimicrobials this admission: Cefepime/flagyl x1   Zosyn 9/18 >>   Thank you for allowing pharmacy to be a part of this patient's care.  Arabella Merles, PharmD. Clinical Pharmacist 12/01/2022 11:48 PM

## 2022-12-01 NOTE — Subjective & Objective (Signed)
2 day abd pain  Left/right lower quadrant Hx of diverticulitis in the past Started on Flagyl and cefepime  Has a micro perf General Surgery felt no indication for surgical intervention today

## 2022-12-02 ENCOUNTER — Other Ambulatory Visit: Payer: Self-pay

## 2022-12-02 ENCOUNTER — Encounter (HOSPITAL_COMMUNITY): Payer: Self-pay | Admitting: Internal Medicine

## 2022-12-02 DIAGNOSIS — K5720 Diverticulitis of large intestine with perforation and abscess without bleeding: Secondary | ICD-10-CM | POA: Diagnosis not present

## 2022-12-02 LAB — COMPREHENSIVE METABOLIC PANEL WITH GFR
ALT: 12 U/L (ref 0–44)
AST: 11 U/L — ABNORMAL LOW (ref 15–41)
Albumin: 2.9 g/dL — ABNORMAL LOW (ref 3.5–5.0)
Alkaline Phosphatase: 68 U/L (ref 38–126)
Anion gap: 6 (ref 5–15)
BUN: 13 mg/dL (ref 6–20)
CO2: 25 mmol/L (ref 22–32)
Calcium: 8.1 mg/dL — ABNORMAL LOW (ref 8.9–10.3)
Chloride: 104 mmol/L (ref 98–111)
Creatinine, Ser: 0.93 mg/dL (ref 0.61–1.24)
GFR, Estimated: >60 mL/min (ref >60)
Glucose, Bld: 95 mg/dL (ref 70–99)
Potassium: 3.6 mmol/L (ref 3.5–5.1)
Sodium: 135 mmol/L (ref 135–145)
Total Bilirubin: 1 mg/dL (ref 0.3–1.2)
Total Protein: 5.8 g/dL — ABNORMAL LOW (ref 6.5–8.1)

## 2022-12-02 LAB — PHOSPHORUS: Phosphorus: 3.7 mg/dL (ref 2.5–4.6)

## 2022-12-02 LAB — MAGNESIUM: Magnesium: 2.2 mg/dL (ref 1.7–2.4)

## 2022-12-02 LAB — CBC
HCT: 39.3 % (ref 39.0–52.0)
Hemoglobin: 12.4 g/dL — ABNORMAL LOW (ref 13.0–17.0)
MCH: 26.3 pg (ref 26.0–34.0)
MCHC: 31.6 g/dL (ref 30.0–36.0)
MCV: 83.4 fL (ref 80.0–100.0)
Platelets: 225 10*3/uL (ref 150–400)
RBC: 4.71 MIL/uL (ref 4.22–5.81)
RDW: 16.1 % — ABNORMAL HIGH (ref 11.5–15.5)
WBC: 10 10*3/uL (ref 4.0–10.5)
nRBC: 0 % (ref 0.0–0.2)

## 2022-12-02 LAB — HEMOGLOBIN A1C
Hgb A1c MFr Bld: 5.3 % (ref 4.8–5.6)
Mean Plasma Glucose: 105.41 mg/dL

## 2022-12-02 MED ORDER — LACTATED RINGERS IV SOLN: INTRAVENOUS | Status: DC

## 2022-12-02 MED ORDER — HYDROMORPHONE HCL 1 MG/ML IJ SOLN
0.5000 mg | INTRAMUSCULAR | Status: DC | PRN
  Administered 2022-12-02 – 2022-12-07 (×18): 1 mg via INTRAVENOUS
  Filled 2022-12-02 (×18): qty 1

## 2022-12-02 NOTE — Plan of Care (Signed)

## 2022-12-02 NOTE — Assessment & Plan Note (Signed)
-   Given small perforation appreciate general surgery consult,  -  Evidence of perforation - Bowel rest  NPO  - Will rehydrate  - Continue IV antibiotics as patient was unable to tolerate p.o. due to significant nausea                   started on  cefepime  metronidazole transitioned to zosyn on 12/02/22 Surgery closely following with serial abdominal exams Have a low threshold for operative intervention

## 2022-12-02 NOTE — Assessment & Plan Note (Signed)
Hold Lipitor for tonight

## 2022-12-02 NOTE — Assessment & Plan Note (Signed)
Check hemoglobin A1c.

## 2022-12-02 NOTE — Consult Note (Signed)
Marcus Andrews 11-15-1960  [id number].    Requesting MD: Tiburcio Pea Chief Complaint/Reason for Consult: Diverticulitis   HPI:  62 y/o M w/ a hx of diverticulitis s/p multiple flares who presented to an outside ED with severe abdominal pain that started 2 days prior.  The pain was more severe than his prior flares and when it failed to resolve he presented to evaluation. CT showed in diverticulitis with a moderate amount of pneumoperitoneum w/o an abscess.  He was transferred to the [facility] ED and arrived in stable condition. WBC 12, Tmax 99, HR 60s.  On exam, patient resting in bed and appears somewhat uncomfortably but NAD.  His last colonoscopy was in 2021 and was notable for 2 sessile polyps and diverticula.   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    ROS: Review of Systems  Constitutional:  Positive for malaise/fatigue.  HENT: Negative.    Eyes: Negative.   Respiratory: Negative.    Cardiovascular: Negative.   Gastrointestinal:  Positive for abdominal pain and nausea.  Genitourinary: Negative.   Musculoskeletal: Negative.   Skin: Negative.   Neurological: Negative.   Endo/Heme/Allergies: Negative.   Psychiatric/Behavioral: Negative.      Family History  Problem Relation Age of Onset   Breast cancer Mother    Colon cancer Mother 52   Colon polyps Mother    Lymphoma Mother    Heart disease Father    Heart failure Father    Heart attack Father 25   Hypertension Sister    Hyperlipidemia Sister    Asthma Sister    Aneurysm Sister    Hypertension Brother    Hyperlipidemia Brother    Deep vein thrombosis Brother    Heart attack Brother        50-60   Hypertension Maternal Grandmother    Diabetes Maternal Grandmother    Hypertension Maternal Aunt    Cancer Maternal Aunt        type unknown   Hypertension Maternal Uncle    Liver cancer Maternal Uncle    Hypertension Paternal Aunt    Hypertension Paternal Uncle    Esophageal cancer  Neg Hx    Rectal cancer Neg Hx    Stomach cancer Neg Hx     Past Medical History:  Diagnosis Date   Arthritis    Colon polyp    Diverticulosis    Refusal of blood transfusions as patient is Jehovah's Witness     Past Surgical History:  Procedure Laterality Date   COLONOSCOPY     KNEE ARTHROSCOPY WITH MEDIAL MENISECTOMY Right 02/05/2020   Procedure: Right knee arthroscopy with partial medial and lateral menisectomy, chondroplasty, partial synovectomy, removal loose body;  Surgeon: Lyndle Herrlich, MD;  Location: [facility] ORS;  Service: Orthopedics;  Laterality: Right;   MENISCUS REPAIR Left 01/2014   TOOTH EXTRACTION     UPPER GASTROINTESTINAL ENDOSCOPY  05/29/2021    Social History:  reports that he quit smoking about 24 years ago. His smoking use included cigarettes. He started smoking about 62 years ago. He has a 19 pack-year smoking history. He has never used smokeless tobacco. He reports that he does not currently use alcohol after a past usage of about 1.0 standard drink of alcohol per week. He reports that he does not use drugs.  Allergies: No Known Allergies  (Not in a hospital admission)   Physical Exam: Blood pressure (!) 145/90, pulse 73, temperature 99.1 F (37.3 C), resp.  rate (!) 22, height 6' (1.829 m), weight 82.6 kg, SpO2 98%. Gen: male resting in bed Resp: no increased WOB CV: RRR, HR 60s Abd: moderately distended, diffusely tender to palpation worse in the LLQ, + guarding Neuro: moving all extremities  Results for orders placed or performed during the hospital encounter of 12/01/22 (from the past 48 hour(s))  CBC with Differential     Status: Abnormal   Collection Time: 12/01/22  1:16 PM  Result Value Ref Range   WBC 11.4 (H) 4.0 - 10.5 K/uL   RBC 5.45 4.22 - 5.81 MIL/uL   Hemoglobin 14.2 13.0 - 17.0 g/dL   HCT 78.4 69.6 - 29.5 %   MCV 82.8 80.0 - 100.0 fL   MCH 26.1 26.0 - 34.0 pg   MCHC 31.5 30.0 - 36.0 g/dL   RDW 28.4 (H) 13.2 - 44.0 %   Platelets  281 150 - 400 K/uL   nRBC 0.0 0.0 - 0.2 %   Neutrophils Relative % 81 %   Neutro Abs 9.1 (H) 1.7 - 7.7 K/uL   Lymphocytes Relative 12 %   Lymphs Abs 1.4 0.7 - 4.0 K/uL   Monocytes Relative 7 %   Monocytes Absolute 0.8 0.1 - 1.0 K/uL   Eosinophils Relative 0 %   Eosinophils Absolute 0.0 0.0 - 0.5 K/uL   Basophils Relative 0 %   Basophils Absolute 0.0 0.0 - 0.1 K/uL   Immature Granulocytes 0 %   Abs Immature Granulocytes 0.03 0.00 - 0.07 K/uL    Comment: Performed at [facility], [address] [place], [place] [address]  Comprehensive metabolic panel     Status: Abnormal   Collection Time: 12/01/22  1:16 PM  Result Value Ref Range   Sodium 137 135 - 145 mmol/L   Potassium 3.6 3.5 - 5.1 mmol/L   Chloride 104 98 - 111 mmol/L   CO2 22 22 - 32 mmol/L   Glucose, Bld 95 70 - 99 mg/dL    Comment: Glucose reference range applies only to samples taken after fasting for at least 8 hours.   BUN 16 6 - 20 mg/dL   Creatinine, Ser 5.36 0.61 - 1.24 mg/dL   Calcium 8.7 (L) 8.9 - 10.3 mg/dL   Total Protein 7.4 6.5 - 8.1 g/dL   Albumin 4.0 3.5 - 5.0 g/dL   AST 15 15 - 41 U/L   ALT 15 0 - 44 U/L   Alkaline Phosphatase 84 38 - 126 U/L   Total Bilirubin 1.2 0.3 - 1.2 mg/dL   GFR, Estimated >64 >40 mL/min    Comment: (NOTE) Calculated using the CKD-EPI Creatinine Equation (2021)    Anion gap 11 5 - 15    Comment: Performed at [facility], [address] [place], [place] [address]  Lipase, blood     Status: None   Collection Time: 12/01/22  1:16 PM  Result Value Ref Range   Lipase 20 11 - 51 U/L    Comment: Performed at [facility], [address] [place], [place] [address]  Urinalysis, Routine w reflex microscopic -Urine, Random     Status: Abnormal   Collection Time: 12/01/22  5:01 PM  Result Value Ref Range   Color, Urine YELLOW (A) YELLOW   APPearance CLEAR (A) CLEAR   Specific Gravity, Urine 1.016 1.005 - 1.030   pH 6.0 5.0 - 8.0   Glucose, UA  NEGATIVE NEGATIVE mg/dL   Hgb urine dipstick SMALL (A) NEGATIVE   Bilirubin  Urine NEGATIVE NEGATIVE   Ketones, ur NEGATIVE NEGATIVE mg/dL   Protein, ur NEGATIVE NEGATIVE mg/dL   Nitrite NEGATIVE NEGATIVE   Leukocytes,Ua NEGATIVE NEGATIVE   RBC / HPF 0-5 0 - 5 RBC/hpf   WBC, UA 0-5 0 - 5 WBC/hpf   Bacteria, UA NONE SEEN NONE SEEN   Squamous Epithelial / HPF 0-5 0 - 5 /HPF   Mucus PRESENT     Comment: Performed at [facility], [address] [place], [place] [address]   CT ABDOMEN PELVIS W CONTRAST  Result Date: 12/01/2022 CLINICAL DATA:  Bilateral lower abdominal pain EXAM: CT ABDOMEN AND PELVIS WITH CONTRAST TECHNIQUE: Multidetector CT imaging of the abdomen and pelvis was performed using the standard protocol following bolus administration of intravenous contrast. RADIATION DOSE REDUCTION: This exam was performed according to the departmental dose-optimization program which includes automated exposure control, adjustment of the mA and/or kV according to patient size and/or use of iterative reconstruction technique. CONTRAST:  OMNIPAQUE IOHEXOL 300 MG/ML  SOLN COMPARISON:  10/24/2021 FINDINGS: Lower chest: Bibasilar scarring. No acute pleural or parenchymal lung disease. Hepatobiliary: Stable hepatic cysts. No other focal liver abnormality. The gallbladder is unremarkable. No biliary duct dilation. Pancreas: Unremarkable. No pancreatic ductal dilatation or surrounding inflammatory changes. Spleen: Normal in size without focal abnormality. Adrenals/Urinary Tract: Adrenal glands are unremarkable. Kidneys are normal, without renal calculi, focal lesion, or hydronephrosis. Bladder is unremarkable. Stomach/Bowel: Distal colonic diverticulosis, with long segment wall thickening and pericolonic fat stranding involving the sigmoid colon consistent with acute diverticulitis. There is evidence of sigmoid colon perforation, with free gas throughout the abdomen and pelvis. There is a loculated  gas collection overlying the left iliac vessels measuring 3.1 x 2.0 cm, but I do not see any fluid collection or abscess at this time. Normal appendix right lower quadrant. No bowel obstruction. Scattered gas fluid levels within the distal jejunum may reflect ileus given the adjacent perforated sigmoid diverticulitis. Vascular/Lymphatic: Aortic atherosclerosis. No enlarged abdominal or pelvic lymph nodes. Reproductive: Prostate is unremarkable. Other: Pneumoperitoneum throughout the abdomen and pelvis consistent with perforated sigmoid diverticulitis. Trace pelvic free fluid. No abdominal wall hernia. Musculoskeletal: No acute or destructive bony abnormalities. Reconstructed images demonstrate no additional findings. IMPRESSION: 1. Acute perforated sigmoid diverticulitis. Diffuse pneumoperitoneum, with loculated gas collection overlying the left iliac vessels as above. There is no drainable fluid collection or abscess at this time. 2. Scattered gas fluid levels within the distal jejunum, likely representing ileus given the inflammatory changes of the sigmoid colon described above. 3.  Aortic Atherosclerosis (ICD10-I70.0). Critical Value/emergent results were called by telephone at the time of interpretation on 12/01/2022 at 6:14 pm to provider JENISE MENSHEW , who verbally acknowledged these results. Electronically Signed   By: Sharlet Salina M.D.   On: 12/01/2022 18:20   US Abdomen Limited RUQ (LIVER/GB)  Result Date: 12/01/2022 CLINICAL DATA:  Right upper quadrant pain EXAM: ULTRASOUND ABDOMEN LIMITED RIGHT UPPER QUADRANT COMPARISON:  CT abdomen pelvis 10/24/2021 FINDINGS: Gallbladder: No gallstones or wall thickening visualized. No sonographic Murphy sign noted by sonographer. Common bile duct: Diameter: 3.4 mm Liver: No focal lesion identified. Within normal limits in parenchymal echogenicity. Portal vein is patent on color Doppler imaging with normal direction of blood flow towards the liver. Other: None.  IMPRESSION: No cholelithiasis or sonographic evidence for acute cholecystitis. Electronically Signed   By: Annia Belt M.D.   On: 12/01/2022 14:35    Assessment/Plan 62 y/o M w/ a hx of diverticulitis c/b multiple flares  who presents with 2 days of abdominal pain and has a CT showing diverticulitis w/ pneumoperitoneum without an abscess or signs of ongoing contamination  - He is diffusely tender w/ a moderate amount of pneumoperitoneum but he is clinically stable and on repeat examination there has been some improvement in his abdominal pain - Admit to medicine - Continue IV abx - NPO w/ MIVF - Will continue to monitor clinically to determine need for surgery.  I reviewed last 24 h vitals and pain scores, last 24 h labs and trends, and last 24 h imaging results.  Tacy Learn Surgery 12/02/2022, 12:03 AM Please see Amion for pager number during day hours 7:00am-4:30pm or 7:00am -11:30am on weekends

## 2022-12-02 NOTE — ED Notes (Signed)
ED TO INPATIENT HANDOFF REPORT  ED Nurse Name and Phone #: Marcelino Duster [phone]  S Name/Age/Gender Hilma Favors 62 y.o. male Room/Bed: 040C/040C  Code Status   Code Status: Full Code  Home/SNF/Other Home Patient oriented to: self, place, time, and situation Is this baseline? Yes   Triage Complete: Triage complete  Chief Complaint Diverticulitis of colon with perforation [K57.20]  Triage Note Pt arrives to [facility] from [facility] by [facility]. Abdominal pain in left/right lower quadrants since Sunday. Hx diverticulitis, reports hole in colon with free air in abd.   Has received 500mg  flagyl, 2g cefepime, and 2L NS     Allergies No Known Allergies  Level of Care/Admitting Diagnosis ED Disposition     ED Disposition  Admit   Condition  --   Comment  Hospital Area: [facility] [[id number]]  Level of Care: Progressive [102]  Admit to Progressive based on following criteria: MULTISYSTEM THREATS such as stable sepsis, metabolic/electrolyte imbalance with or without encephalopathy that is responding to early treatment.  May admit patient to [facility] or [facility] if equivalent level of care is available:: No  Covid Evaluation: Asymptomatic - no recent exposure (last 10 days) testing not required  Diagnosis: Diverticulitis of colon with perforation [284132]  Admitting Physician: Therisa Doyne [[id number]]  Attending Physician: Therisa Doyne [[id number]]  Certification:: I certify this patient will need inpatient services for at least 2 midnights  Expected Medical Readiness: 12/03/2022          B Medical/Surgery History Past Medical History:  Diagnosis Date   Arthritis    Colon polyp    Diverticulosis    Refusal of blood transfusions as patient is Jehovah's Witness    Past Surgical History:  Procedure Laterality Date   COLONOSCOPY     KNEE ARTHROSCOPY WITH MEDIAL MENISECTOMY Right 02/05/2020   Procedure: Right knee arthroscopy with partial medial and  lateral menisectomy, chondroplasty, partial synovectomy, removal loose body;  Surgeon: Lyndle Herrlich, MD;  Location: [facility] ORS;  Service: Orthopedics;  Laterality: Right;   MENISCUS REPAIR Left 01/2014   TOOTH EXTRACTION     UPPER GASTROINTESTINAL ENDOSCOPY  05/29/2021     A IV Location/Drains/Wounds Patient Lines/Drains/Airways Status     Active Line/Drains/Airways     Name Placement date Placement time Site Days   Peripheral IV 12/01/22 20 G Left Hand 12/01/22  1718  Hand  1            Intake/Output Last 24 hours No intake or output data in the 24 hours ending 12/02/22 1651  Labs/Imaging Results for orders placed or performed during the hospital encounter of 12/01/22 (from the past 48 hour(s))  Basic metabolic panel     Status: Abnormal   Collection Time: 12/01/22 10:49 PM  Result Value Ref Range   Sodium 138 135 - 145 mmol/L   Potassium 3.8 3.5 - 5.1 mmol/L   Chloride 101 98 - 111 mmol/L   CO2 21 (L) 22 - 32 mmol/L   Glucose, Bld 95 70 - 99 mg/dL    Comment: Glucose reference range applies only to samples taken after fasting for at least 8 hours.   BUN 14 6 - 20 mg/dL   Creatinine, Ser 4.40 0.61 - 1.24 mg/dL   Calcium 8.5 (L) 8.9 - 10.3 mg/dL   GFR, Estimated >10 >27 mL/min    Comment: (NOTE) Calculated using the CKD-EPI Creatinine Equation (2021)    Anion gap 16 (H) 5 - 15    Comment: Performed  at [facility], [address], [place], [place] [address]  CK     Status: Abnormal   Collection Time: 12/01/22 10:49 PM  Result Value Ref Range   Total CK 44 (L) 49 - 397 U/L    Comment: Performed at [facility], [address], [place], [place] [address]  Magnesium     Status: None   Collection Time: 12/01/22 10:49 PM  Result Value Ref Range   Magnesium 2.2 1.7 - 2.4 mg/dL    Comment: Performed at [facility], [address], [place], [place] [address]  Procalcitonin     Status: None   Collection Time: 12/01/22 10:49 PM  Result Value Ref Range    Procalcitonin 0.41 ng/mL    Comment:        Interpretation: PCT (Procalcitonin) <= 0.5 ng/mL: Systemic infection (sepsis) is not likely. Local bacterial infection is possible. (NOTE)       Sepsis PCT Algorithm           Lower Respiratory Tract                                      Infection PCT Algorithm    ----------------------------     ----------------------------         PCT < 0.25 ng/mL                PCT < 0.10 ng/mL          Strongly encourage             Strongly discourage   discontinuation of antibiotics    initiation of antibiotics    ----------------------------     -----------------------------       PCT 0.25 - 0.50 ng/mL            PCT 0.10 - 0.25 ng/mL               OR       >80% decrease in PCT            Discourage initiation of                                            antibiotics      Encourage discontinuation           of antibiotics    ----------------------------     -----------------------------         PCT >= 0.50 ng/mL              PCT 0.26 - 0.50 ng/mL               AND        <80% decrease in PCT             Encourage initiation of                                             antibiotics       Encourage continuation           of antibiotics    ----------------------------     -----------------------------        PCT >= 0.50 ng/mL  PCT > 0.50 ng/mL               AND         increase in PCT                  Strongly encourage                                      initiation of antibiotics    Strongly encourage escalation           of antibiotics                                     -----------------------------                                           PCT <= 0.25 ng/mL                                                 OR                                        > 80% decrease in PCT                                      Discontinue / Do not initiate                                             antibiotics  Performed at [facility], [address], [place], [place] [address]   TSH     Status: None   Collection Time: 12/01/22 10:49 PM  Result Value Ref Range   TSH 1.690 0.350 - 4.500 uIU/mL    Comment: Performed by a 3rd Generation assay with a functional sensitivity of <=0.01 uIU/mL. Performed at [facility], [address], [place], [place] [address]   Hemoglobin A1c     Status: None   Collection Time: 12/01/22 10:49 PM  Result Value Ref Range   Hgb A1c MFr Bld 5.3 4.8 - 5.6 %    Comment: (NOTE) Pre diabetes:          5.7%-6.4%  Diabetes:              >6.4%  Glycemic control for   <7.0% adults with diabetes    Mean Plasma Glucose 105.41 mg/dL    Comment: Performed at [facility], [address] [place], [place] [address]  Prealbumin     Status: Abnormal   Collection Time: 12/02/22  3:06 AM  Result Value Ref Range   Prealbumin 17 (L) 18 - 38 mg/dL    Comment: Performed at [facility], [address], [place], [place] [address]  Magnesium     Status: None  Collection Time: 12/02/22  3:06 AM  Result Value Ref Range   Magnesium 2.2 1.7 - 2.4 mg/dL    Comment: Performed at [facility], [address], [place], [place] [address]  Phosphorus     Status: None   Collection Time: 12/02/22  3:06 AM  Result Value Ref Range   Phosphorus 3.7 2.5 - 4.6 mg/dL    Comment: Performed at [facility], [address], [place], [place] [address]  Comprehensive metabolic panel     Status: Abnormal   Collection Time: 12/02/22  3:06 AM  Result Value Ref Range   Sodium 135 135 - 145 mmol/L   Potassium 3.6 3.5 - 5.1 mmol/L   Chloride 104 98 - 111 mmol/L   CO2 25 22 - 32 mmol/L   Glucose, Bld 95 70 - 99 mg/dL    Comment: Glucose reference range applies only to samples taken after fasting for at least 8 hours.   BUN 13 6 - 20 mg/dL   Creatinine, Ser 3.24 0.61 - 1.24 mg/dL   Calcium 8.1 (L) 8.9 - 10.3 mg/dL   Total Protein 5.8 (L) 6.5 - 8.1 g/dL   Albumin 2.9 (L) 3.5 - 5.0 g/dL    AST 11 (L) 15 - 41 U/L   ALT 12 0 - 44 U/L   Alkaline Phosphatase 68 38 - 126 U/L   Total Bilirubin 1.0 0.3 - 1.2 mg/dL   GFR, Estimated >40 >10 mL/min    Comment: (NOTE) Calculated using the CKD-EPI Creatinine Equation (2021)    Anion gap 6 5 - 15    Comment: Performed at [facility], [address], [place], [place] [address]  CBC     Status: Abnormal   Collection Time: 12/02/22  3:06 AM  Result Value Ref Range   WBC 10.0 4.0 - 10.5 K/uL   RBC 4.71 4.22 - 5.81 MIL/uL   Hemoglobin 12.4 (L) 13.0 - 17.0 g/dL   HCT 66.4 40.3 - 47.4 %   MCV 83.4 80.0 - 100.0 fL   MCH 26.3 26.0 - 34.0 pg   MCHC 31.6 30.0 - 36.0 g/dL   RDW 25.9 (H) 56.3 - 87.5 %   Platelets 225 150 - 400 K/uL   nRBC 0.0 0.0 - 0.2 %    Comment: Performed at [facility], [address] [place], [place] [address]   CT ABDOMEN PELVIS W CONTRAST  Result Date: 12/01/2022 CLINICAL DATA:  Bilateral lower abdominal pain EXAM: CT ABDOMEN AND PELVIS WITH CONTRAST TECHNIQUE: Multidetector CT imaging of the abdomen and pelvis was performed using the standard protocol following bolus administration of intravenous contrast. RADIATION DOSE REDUCTION: This exam was performed according to the departmental dose-optimization program which includes automated exposure control, adjustment of the mA and/or kV according to patient size and/or use of iterative reconstruction technique. CONTRAST:  OMNIPAQUE IOHEXOL 300 MG/ML  SOLN COMPARISON:  10/24/2021 FINDINGS: Lower chest: Bibasilar scarring. No acute pleural or parenchymal lung disease. Hepatobiliary: Stable hepatic cysts. No other focal liver abnormality. The gallbladder is unremarkable. No biliary duct dilation. Pancreas: Unremarkable. No pancreatic ductal dilatation or surrounding inflammatory changes. Spleen: Normal in size without focal abnormality. Adrenals/Urinary Tract: Adrenal glands are unremarkable. Kidneys are normal, without renal calculi, focal lesion, or  hydronephrosis. Bladder is unremarkable. Stomach/Bowel: Distal colonic diverticulosis, with long segment wall thickening and pericolonic fat stranding involving the sigmoid colon consistent with acute diverticulitis. There is evidence of sigmoid colon perforation, with free gas  throughout the abdomen and pelvis. There is a loculated gas collection overlying the left iliac vessels measuring 3.1 x 2.0 cm, but I do not see any fluid collection or abscess at this time. Normal appendix right lower quadrant. No bowel obstruction. Scattered gas fluid levels within the distal jejunum may reflect ileus given the adjacent perforated sigmoid diverticulitis. Vascular/Lymphatic: Aortic atherosclerosis. No enlarged abdominal or pelvic lymph nodes. Reproductive: Prostate is unremarkable. Other: Pneumoperitoneum throughout the abdomen and pelvis consistent with perforated sigmoid diverticulitis. Trace pelvic free fluid. No abdominal wall hernia. Musculoskeletal: No acute or destructive bony abnormalities. Reconstructed images demonstrate no additional findings. IMPRESSION: 1. Acute perforated sigmoid diverticulitis. Diffuse pneumoperitoneum, with loculated gas collection overlying the left iliac vessels as above. There is no drainable fluid collection or abscess at this time. 2. Scattered gas fluid levels within the distal jejunum, likely representing ileus given the inflammatory changes of the sigmoid colon described above. 3.  Aortic Atherosclerosis (ICD10-I70.0). Critical Value/emergent results were called by telephone at the time of interpretation on 12/01/2022 at 6:14 pm to provider JENISE MENSHEW , who verbally acknowledged these results. Electronically Signed   By: Sharlet Salina M.D.   On: 12/01/2022 18:20   US Abdomen Limited RUQ (LIVER/GB)  Result Date: 12/01/2022 CLINICAL DATA:  Right upper quadrant pain EXAM: ULTRASOUND ABDOMEN LIMITED RIGHT UPPER QUADRANT COMPARISON:  CT abdomen pelvis 10/24/2021 FINDINGS:  Gallbladder: No gallstones or wall thickening visualized. No sonographic Murphy sign noted by sonographer. Common bile duct: Diameter: 3.4 mm Liver: No focal lesion identified. Within normal limits in parenchymal echogenicity. Portal vein is patent on color Doppler imaging with normal direction of blood flow towards the liver. Other: None. IMPRESSION: No cholelithiasis or sonographic evidence for acute cholecystitis. Electronically Signed   By: Annia Belt M.D.   On: 12/01/2022 14:35    Pending Labs Unresulted Labs (From admission, onward)    None       Vitals/Pain Today's Vitals   12/02/22 0000 12/02/22 0100 12/02/22 0441 12/02/22 0902  BP: (!) 138/91 (!) 144/95 (!) 141/87 (!) 141/97  Pulse: 65 75 66 (!) 58  Resp: 17 19 18 18   Temp: 98.8 F (37.1 C)  98.6 F (37 C) 98.3 F (36.8 C)  TempSrc:   Oral   SpO2: 98% 96% 97% 98%  Weight:      Height:      PainSc:        Isolation Precautions No active isolations  Medications Medications  0.9 %  sodium chloride infusion ( Intravenous New Bag/Given 12/02/22 0005)  acetaminophen (TYLENOL) tablet 650 mg (has no administration in time range)    Or  acetaminophen (TYLENOL) suppository 650 mg (has no administration in time range)  ondansetron (ZOFRAN) tablet 4 mg ( Oral See Alternative 12/02/22 0832)    Or  ondansetron (ZOFRAN) injection 4 mg (4 mg Intravenous Given 12/02/22 0832)  piperacillin-tazobactam (ZOSYN) IVPB 3.375 g (3.375 g Intravenous New Bag/Given 12/02/22 1436)  HYDROmorphone (DILAUDID) injection 0.5-1 mg (1 mg Intravenous Given 12/02/22 1434)  lactated ringers infusion (has no administration in time range)  ceFEPIme (MAXIPIME) 2 g in sodium chloride 0.9 % 100 mL IVPB (0 g Intravenous Stopped 12/02/22 0000)    Mobility walks     Focused Assessments GI:   Patient reports abdominal pain, and spasms.    Patient c/o headache.   NPO     R Recommendations: See Admitting Provider Note  Report given to:   Additional  Notes:

## 2022-12-02 NOTE — Progress Notes (Addendum)
Went by to evaluate patient and perform abdominal exam.  Patient says pain is controlled, localized to his lower abdomen. Had some mild RUQ pain as well.  He denies nausea or vomiting. Denies flatus or BM    On exam he is well-appearing, in no acute distress, easily carrying on a conversation with me Breathing is non-labored ORA Abdomen - soft, mild to moderate distention, focally tender to palpation of the suprapubic region and LLQ without rebound tenderness or guarding.    Continue attempted non-op mgmt of perforated diverticulitis with bowel rest and IV abx.  I reviewed the patients CT scan with him, he would like a copy of his imaging on a disc to take home.  I reviewed the expected hospital course including the likely need for a repeat CT scan, possible IR drain, and possible surgery if he were to clinically worsen, which would result in a temporary colostomy bag.   Hosie Spangle, [name] [facility] Please see Amion for pager number during day hours 7:00am-4:30pm

## 2022-12-02 NOTE — Progress Notes (Signed)
[facility]                                                                               Amritpal Handyside, is a 62 y.o. male, DOB - [birth year], ZOX:[id number] Admit date - 12/01/2022    Outpatient Primary MD for the patient is Mort Sawyers, FNP  LOS - 1  days    Brief summary   62 y.o. male with medical history significant of diverticulitis, Presented with   abd pain, was found to have microperforation. General surgery consulted for recommendations.    Assessment & Plan    Assessment and Plan:   Acute diverticulitis with pneumoperitoneum without any abscess Patient started on IV antibiotics and general surgery consulted.  Continue with n.p.o. with IV fluids. Pain control and general surgery will determine the need for surgery in the next 24 to 48 hours.   Estimated body mass index is 24.68 kg/m as calculated from the following:   Height as of this encounter: 6' (1.829 m).   Weight as of this encounter: 82.6 kg.  Code Status: full code.  DVT Prophylaxis:  SCDs Start: 12/02/22 0000   Level of Care: Level of care: Progressive Family Communication: none at bedside   Disposition Plan:     Remains inpatient appropriate:  IV antibiotics.    Procedures:  None.   Consultants:   General surgery.   Antimicrobials:   Anti-infectives (From admission, onward)    Start     Dose/Rate Route Frequency Ordered Stop   12/02/22 1100  metroNIDAZOLE (FLAGYL) IVPB 500 mg  Status:  Discontinued       Placed in "And" Linked Group   500 mg 100 mL/hr over 60 Minutes Intravenous Every 12 hours 12/01/22 2311 12/01/22 2343   12/02/22 0600  piperacillin-tazobactam (ZOSYN) IVPB 3.375 g        3.375 g 12.5 mL/hr over 240 Minutes Intravenous Every 8 hours 12/01/22 2343     12/01/22 2315  ceFEPIme (MAXIPIME) 2 g in sodium chloride 0.9 % 100 mL IVPB  Status:  Discontinued       Placed in "And" Linked Group   2 g 200 mL/hr over 30 Minutes Intravenous  Once 12/01/22 2311  12/01/22 2343   12/01/22 2215  ceFEPIme (MAXIPIME) 2 g in sodium chloride 0.9 % 100 mL IVPB       Placed in "And" Linked Group   2 g 200 mL/hr over 30 Minutes Intravenous  Once 12/01/22 2205 12/02/22 0000   12/01/22 2215  metroNIDAZOLE (FLAGYL) IVPB 500 mg  Status:  Discontinued       Placed in "And" Linked Group   500 mg 100 mL/hr over 60 Minutes Intravenous  Once 12/01/22 2205 12/01/22 2343        Medications  Scheduled Meds: Continuous Infusions:  lactated ringers     piperacillin-tazobactam (ZOSYN)  IV Stopped (12/02/22 0921)   PRN Meds:.acetaminophen **OR** acetaminophen, HYDROmorphone (DILAUDID) injection, ondansetron **OR** ondansetron (ZOFRAN) IV    Subjective:   Jaydus Cloke was seen and examined today.  Reports having a headache.   Objective:   Vitals:   12/02/22 0000 12/02/22 0100 12/02/22 0441 12/02/22 0902  BP: (!) 138/91 Marland Kitchen)  144/95 (!) 141/87 (!) 141/97  Pulse: 65 75 66 (!) 58  Resp: 17 19 18 18   Temp: 98.8 F (37.1 C)  98.6 F (37 C) 98.3 F (36.8 C)  TempSrc:   Oral   SpO2: 98% 96% 97% 98%  Weight:      Height:       No intake or output data in the 24 hours ending 12/02/22 1408 Filed Weights   12/01/22 2052  Weight: 82.6 kg     Exam General: Alert and oriented x 3, NAD Cardiovascular: S1 S2 auscultated, no murmurs, RRR Respiratory: Clear to auscultation bilaterally, no wheezing, rales or rhonchi Gastrointestinal: Soft, mild tenderness in the LLQ. Bs+ Ext: no pedal edema bilaterally Neuro: AAOx3, Cr N's II- XII. Strength 5/5 upper and lower extremities bilaterally Skin: No rashes Psych: Normal affect and demeanor, alert and oriented x3    Data Reviewed:  I have personally reviewed following labs and imaging studies   CBC Lab Results  Component Value Date   WBC 10.0 12/02/2022   RBC 4.71 12/02/2022   HGB 12.4 (L) 12/02/2022   HCT 39.3 12/02/2022   MCV 83.4 12/02/2022   MCH 26.3 12/02/2022   PLT 225 12/02/2022   MCHC 31.6  12/02/2022   RDW 16.1 (H) 12/02/2022   LYMPHSABS 1.4 12/01/2022   MONOABS 0.8 12/01/2022   EOSABS 0.0 12/01/2022   BASOSABS 0.0 12/01/2022     Last metabolic panel Lab Results  Component Value Date   NA 135 12/02/2022   K 3.6 12/02/2022   CL 104 12/02/2022   CO2 25 12/02/2022   BUN 13 12/02/2022   CREATININE 0.93 12/02/2022   GLUCOSE 95 12/02/2022   GFRNONAA >60 12/02/2022   GFRAA >60 06/25/2019   CALCIUM 8.1 (L) 12/02/2022   PHOS 3.7 12/02/2022   PROT 5.8 (L) 12/02/2022   ALBUMIN 2.9 (L) 12/02/2022   BILITOT 1.0 12/02/2022   ALKPHOS 68 12/02/2022   AST 11 (L) 12/02/2022   ALT 12 12/02/2022   ANIONGAP 6 12/02/2022    CBG (last 3)  No results for input(s): "GLUCAP" in the last 72 hours.    Coagulation Profile: No results for input(s): "INR", "PROTIME" in the last 168 hours.   Radiology Studies: CT ABDOMEN PELVIS W CONTRAST  Result Date: 12/01/2022 CLINICAL DATA:  Bilateral lower abdominal pain EXAM: CT ABDOMEN AND PELVIS WITH CONTRAST TECHNIQUE: Multidetector CT imaging of the abdomen and pelvis was performed using the standard protocol following bolus administration of intravenous contrast. RADIATION DOSE REDUCTION: This exam was performed according to the departmental dose-optimization program which includes automated exposure control, adjustment of the mA and/or kV according to patient size and/or use of iterative reconstruction technique. CONTRAST:  OMNIPAQUE IOHEXOL 300 MG/ML  SOLN COMPARISON:  10/24/2021 FINDINGS: Lower chest: Bibasilar scarring. No acute pleural or parenchymal lung disease. Hepatobiliary: Stable hepatic cysts. No other focal liver abnormality. The gallbladder is unremarkable. No biliary duct dilation. Pancreas: Unremarkable. No pancreatic ductal dilatation or surrounding inflammatory changes. Spleen: Normal in size without focal abnormality. Adrenals/Urinary Tract: Adrenal glands are unremarkable. Kidneys are normal, without renal calculi, focal  lesion, or hydronephrosis. Bladder is unremarkable. Stomach/Bowel: Distal colonic diverticulosis, with long segment wall thickening and pericolonic fat stranding involving the sigmoid colon consistent with acute diverticulitis. There is evidence of sigmoid colon perforation, with free gas throughout the abdomen and pelvis. There is a loculated gas collection overlying the left iliac vessels measuring 3.1 x 2.0 cm, but I do not see any fluid collection  or abscess at this time. Normal appendix right lower quadrant. No bowel obstruction. Scattered gas fluid levels within the distal jejunum may reflect ileus given the adjacent perforated sigmoid diverticulitis. Vascular/Lymphatic: Aortic atherosclerosis. No enlarged abdominal or pelvic lymph nodes. Reproductive: Prostate is unremarkable. Other: Pneumoperitoneum throughout the abdomen and pelvis consistent with perforated sigmoid diverticulitis. Trace pelvic free fluid. No abdominal wall hernia. Musculoskeletal: No acute or destructive bony abnormalities. Reconstructed images demonstrate no additional findings. IMPRESSION: 1. Acute perforated sigmoid diverticulitis. Diffuse pneumoperitoneum, with loculated gas collection overlying the left iliac vessels as above. There is no drainable fluid collection or abscess at this time. 2. Scattered gas fluid levels within the distal jejunum, likely representing ileus given the inflammatory changes of the sigmoid colon described above. 3.  Aortic Atherosclerosis (ICD10-I70.0). Critical Value/emergent results were called by telephone at the time of interpretation on 12/01/2022 at 6:14 pm to provider JENISE MENSHEW , who verbally acknowledged these results. Electronically Signed   By: Sharlet Salina M.D.   On: 12/01/2022 18:20   US Abdomen Limited RUQ (LIVER/GB)  Result Date: 12/01/2022 CLINICAL DATA:  Right upper quadrant pain EXAM: ULTRASOUND ABDOMEN LIMITED RIGHT UPPER QUADRANT COMPARISON:  CT abdomen pelvis 10/24/2021  FINDINGS: Gallbladder: No gallstones or wall thickening visualized. No sonographic Murphy sign noted by sonographer. Common bile duct: Diameter: 3.4 mm Liver: No focal lesion identified. Within normal limits in parenchymal echogenicity. Portal vein is patent on color Doppler imaging with normal direction of blood flow towards the liver. Other: None. IMPRESSION: No cholelithiasis or sonographic evidence for acute cholecystitis. Electronically Signed   By: Annia Belt M.D.   On: 12/01/2022 14:35       Kathlen Mody M.D. [facility] 12/02/2022, 2:08 PM  Available via Epic secure chat 7am-7pm After 7 pm, please refer to night coverage provider listed on amion.

## 2022-12-03 ENCOUNTER — Telehealth: Payer: Self-pay | Admitting: Family

## 2022-12-03 DIAGNOSIS — K5720 Diverticulitis of large intestine with perforation and abscess without bleeding: Secondary | ICD-10-CM | POA: Diagnosis not present

## 2022-12-03 LAB — BASIC METABOLIC PANEL
Anion gap: 12 (ref 5–15)
BUN: 15 mg/dL (ref 6–20)
CO2: 25 mmol/L (ref 22–32)
Calcium: 8.8 mg/dL — ABNORMAL LOW (ref 8.9–10.3)
Chloride: 101 mmol/L (ref 98–111)
Creatinine, Ser: 0.97 mg/dL (ref 0.61–1.24)
GFR, Estimated: >60 mL/min (ref >60)
Glucose, Bld: 81 mg/dL (ref 70–99)
Potassium: 3.5 mmol/L (ref 3.5–5.1)
Sodium: 138 mmol/L (ref 135–145)

## 2022-12-03 LAB — CBC WITH DIFFERENTIAL/PLATELET
Abs Immature Granulocytes: 0.03 10*3/uL (ref 0.00–0.07)
Basophils Absolute: 0 10*3/uL (ref 0.0–0.1)
Basophils Relative: 0 %
Eosinophils Absolute: 0.2 10*3/uL (ref 0.0–0.5)
Eosinophils Relative: 2 %
HCT: 44.7 % (ref 39.0–52.0)
Hemoglobin: 14.6 g/dL (ref 13.0–17.0)
Immature Granulocytes: 0 %
Lymphocytes Relative: 13 %
Lymphs Abs: 1.3 10*3/uL (ref 0.7–4.0)
MCH: 27.5 pg (ref 26.0–34.0)
MCHC: 32.7 g/dL (ref 30.0–36.0)
MCV: 84.2 fL (ref 80.0–100.0)
Monocytes Absolute: 0.6 10*3/uL (ref 0.1–1.0)
Monocytes Relative: 7 %
Neutro Abs: 7.7 10*3/uL (ref 1.7–7.7)
Neutrophils Relative %: 78 %
Platelets: 295 10*3/uL (ref 150–400)
RBC: 5.31 MIL/uL (ref 4.22–5.81)
RDW: 15.9 % — ABNORMAL HIGH (ref 11.5–15.5)
WBC: 9.9 10*3/uL (ref 4.0–10.5)
nRBC: 0 % (ref 0.0–0.2)

## 2022-12-03 MED ORDER — ORAL CARE MOUTH RINSE: 15.0000 mL | OROMUCOSAL | Status: DC | PRN

## 2022-12-03 NOTE — Telephone Encounter (Signed)
Spoke with pt and he had questions about surgery and wanted to know if Dr. Russella Dar would do surgery. Discussed with pt that he does not and that he would have to be seen by surgery. He did see a Careers adviser but briefly as they are waiting to see if the perforation will heal. He verbalized understanding and will ask his questions to the surgeon.

## 2022-12-03 NOTE — Telephone Encounter (Signed)
Inbound call from patient stating he is currently hospitalized due to diverticulitis. Patient is requesting to speak further. Please advise, thank you.

## 2022-12-03 NOTE — Plan of Care (Signed)
  Problem: Health Behavior/Discharge Planning: Goal: Ability to manage health-related needs will improve Outcome: Progressing   Problem: Clinical Measurements: Goal: Ability to maintain clinical measurements within normal limits will improve Outcome: Progressing Goal: Will remain free from infection Outcome: Progressing Goal: Respiratory complications will improve Outcome: Progressing Goal: Cardiovascular complication will be avoided Outcome: Progressing   Problem: Pain Managment: Goal: General experience of comfort will improve Outcome: Progressing   Problem: Safety: Goal: Ability to remain free from injury will improve Outcome: Progressing

## 2022-12-03 NOTE — Telephone Encounter (Signed)
While inpatient hospitalist to manage care and discuss concerns with pt as they are temporarily taking over as their primary provider while in. Once out we will schedule hospital follow up and be able to look over the plan of care that was dictated and advised during inpatient.

## 2022-12-03 NOTE — Progress Notes (Signed)
[facility]                                                                               Marcus Andrews, is a 62 y.o. male, DOB - Jun 21, 1960, ZOX:[id number] Admit date - 12/01/2022    Outpatient Primary MD for the patient is Mort Sawyers, FNP  LOS - 2  days    Brief summary   62 y.o. male with medical history significant of diverticulitis, Presented with   abd pain, was found to have microperforation. General surgery consulted for recommendations.    Assessment & Plan    Assessment and Plan:   Acute diverticulitis with pneumoperitoneum without any abscess Patient started on IV antibiotics and general surgery consulted.   Continue with IV fluids.  Pain control . ON Exam patient has minimal abd pain. No nausea, vomiting or diarrhea.  He was started on clears and able to tolerate.  Conservative management.     Estimated body mass index is 24.67 kg/m as calculated from the following:   Height as of this encounter: 6' (1.829 m).   Weight as of this encounter: 82.5 kg.  Code Status: full code.  DVT Prophylaxis:  SCDs Start: 12/02/22 0000   Level of Care: Level of care: Progressive Family Communication: none at bedside   Disposition Plan:     Remains inpatient appropriate:  IV antibiotics.    Procedures:  None.   Consultants:   General surgery.   Antimicrobials:   Anti-infectives (From admission, onward)    Start     Dose/Rate Route Frequency Ordered Stop   12/02/22 1100  metroNIDAZOLE (FLAGYL) IVPB 500 mg  Status:  Discontinued       Placed in "And" Linked Group   500 mg 100 mL/hr over 60 Minutes Intravenous Every 12 hours 12/01/22 2311 12/01/22 2343   12/02/22 0600  piperacillin-tazobactam (ZOSYN) IVPB 3.375 g        3.375 g 12.5 mL/hr over 240 Minutes Intravenous Every 8 hours 12/01/22 2343     12/01/22 2315  ceFEPIme (MAXIPIME) 2 g in sodium chloride 0.9 % 100 mL IVPB  Status:  Discontinued       Placed in "And" Linked Group   2 g 200  mL/hr over 30 Minutes Intravenous  Once 12/01/22 2311 12/01/22 2343   12/01/22 2215  ceFEPIme (MAXIPIME) 2 g in sodium chloride 0.9 % 100 mL IVPB       Placed in "And" Linked Group   2 g 200 mL/hr over 30 Minutes Intravenous  Once 12/01/22 2205 12/02/22 0000   12/01/22 2215  metroNIDAZOLE (FLAGYL) IVPB 500 mg  Status:  Discontinued       Placed in "And" Linked Group   500 mg 100 mL/hr over 60 Minutes Intravenous  Once 12/01/22 2205 12/01/22 2343        Medications  Scheduled Meds: Continuous Infusions:  lactated ringers 100 mL/hr at 12/03/22 1147   piperacillin-tazobactam (ZOSYN)  IV 3.375 g (12/03/22 1449)   PRN Meds:.acetaminophen **OR** acetaminophen, HYDROmorphone (DILAUDID) injection, ondansetron **OR** ondansetron (ZOFRAN) IV, mouth rinse    Subjective:   Marcus Andrews was seen and examined today.  Minimal abd pain, no nausea, vomiting.  Objective:   Vitals:   12/02/22 2240 12/03/22 0533 12/03/22 0752 12/03/22 1410  BP: 133/85 (!) 140/86  (!) 139/96  Pulse:  60 65   Resp:  20 20 16   Temp:  98.6 F (37 C) 98.5 F (36.9 C) 98.7 F (37.1 C)  TempSrc:  Oral Oral Oral  SpO2:  96%    Weight:      Height:        Intake/Output Summary (Last 24 hours) at 12/03/2022 1736 Last data filed at 12/03/2022 1148 Gross per 24 hour  Intake 1222.86 ml  Output 1350 ml  Net -127.14 ml   Filed Weights   12/01/22 2052 12/02/22 1744  Weight: 82.6 kg 82.5 kg     Exam General exam: Appears calm and comfortable  Respiratory system: Clear to auscultation. Respiratory effort normal. Cardiovascular system: S1 & S2 heard, RRR. Gastrointestinal system: Abdomen is soft, mild pain in the LLQ.  Central nervous system: Alert and oriented. No focal neurological deficits. Extremities: Symmetric 5 x 5 power. Skin: No rashes,  Psychiatry: Mood & affect appropriate.    Data Reviewed:  I have personally reviewed following labs and imaging studies   CBC Lab Results  Component  Value Date   WBC 9.9 12/03/2022   RBC 5.31 12/03/2022   HGB 14.6 12/03/2022   HCT 44.7 12/03/2022   MCV 84.2 12/03/2022   MCH 27.5 12/03/2022   PLT 295 12/03/2022   MCHC 32.7 12/03/2022   RDW 15.9 (H) 12/03/2022   LYMPHSABS 1.3 12/03/2022   MONOABS 0.6 12/03/2022   EOSABS 0.2 12/03/2022   BASOSABS 0.0 12/03/2022     Last metabolic panel Lab Results  Component Value Date   NA 138 12/03/2022   K 3.5 12/03/2022   CL 101 12/03/2022   CO2 25 12/03/2022   BUN 15 12/03/2022   CREATININE 0.97 12/03/2022   GLUCOSE 81 12/03/2022   GFRNONAA >60 12/03/2022   GFRAA >60 06/25/2019   CALCIUM 8.8 (L) 12/03/2022   PHOS 3.7 12/02/2022   PROT 5.8 (L) 12/02/2022   ALBUMIN 2.9 (L) 12/02/2022   BILITOT 1.0 12/02/2022   ALKPHOS 68 12/02/2022   AST 11 (L) 12/02/2022   ALT 12 12/02/2022   ANIONGAP 12 12/03/2022    CBG (last 3)  No results for input(s): "GLUCAP" in the last 72 hours.    Coagulation Profile: No results for input(s): "INR", "PROTIME" in the last 168 hours.   Radiology Studies: CT ABDOMEN PELVIS W CONTRAST  Result Date: 12/01/2022 CLINICAL DATA:  Bilateral lower abdominal pain EXAM: CT ABDOMEN AND PELVIS WITH CONTRAST TECHNIQUE: Multidetector CT imaging of the abdomen and pelvis was performed using the standard protocol following bolus administration of intravenous contrast. RADIATION DOSE REDUCTION: This exam was performed according to the departmental dose-optimization program which includes automated exposure control, adjustment of the mA and/or kV according to patient size and/or use of iterative reconstruction technique. CONTRAST:  OMNIPAQUE IOHEXOL 300 MG/ML  SOLN COMPARISON:  10/24/2021 FINDINGS: Lower chest: Bibasilar scarring. No acute pleural or parenchymal lung disease. Hepatobiliary: Stable hepatic cysts. No other focal liver abnormality. The gallbladder is unremarkable. No biliary duct dilation. Pancreas: Unremarkable. No pancreatic ductal dilatation or  surrounding inflammatory changes. Spleen: Normal in size without focal abnormality. Adrenals/Urinary Tract: Adrenal glands are unremarkable. Kidneys are normal, without renal calculi, focal lesion, or hydronephrosis. Bladder is unremarkable. Stomach/Bowel: Distal colonic diverticulosis, with long segment wall thickening and pericolonic fat stranding involving the sigmoid colon consistent with acute diverticulitis. There is evidence  of sigmoid colon perforation, with free gas throughout the abdomen and pelvis. There is a loculated gas collection overlying the left iliac vessels measuring 3.1 x 2.0 cm, but I do not see any fluid collection or abscess at this time. Normal appendix right lower quadrant. No bowel obstruction. Scattered gas fluid levels within the distal jejunum may reflect ileus given the adjacent perforated sigmoid diverticulitis. Vascular/Lymphatic: Aortic atherosclerosis. No enlarged abdominal or pelvic lymph nodes. Reproductive: Prostate is unremarkable. Other: Pneumoperitoneum throughout the abdomen and pelvis consistent with perforated sigmoid diverticulitis. Trace pelvic free fluid. No abdominal wall hernia. Musculoskeletal: No acute or destructive bony abnormalities. Reconstructed images demonstrate no additional findings. IMPRESSION: 1. Acute perforated sigmoid diverticulitis. Diffuse pneumoperitoneum, with loculated gas collection overlying the left iliac vessels as above. There is no drainable fluid collection or abscess at this time. 2. Scattered gas fluid levels within the distal jejunum, likely representing ileus given the inflammatory changes of the sigmoid colon described above. 3.  Aortic Atherosclerosis (ICD10-I70.0). Critical Value/emergent results were called by telephone at the time of interpretation on 12/01/2022 at 6:14 pm to provider JENISE MENSHEW , who verbally acknowledged these results. Electronically Signed   By: Sharlet Salina M.D.   On: 12/01/2022 18:20       Kathlen Mody M.D. [facility] 12/03/2022, 5:36 PM  Available via Epic secure chat 7am-7pm After 7 pm, please refer to night coverage provider listed on amion.

## 2022-12-03 NOTE — Progress Notes (Signed)
Subjective: Patient feels about the same.  Pain is stable.  No nausea or vomiting.  ROS: See above, otherwise other systems negative  Objective: Vital signs in last 24 hours: Temp:  [98.4 F (36.9 C)-98.9 F (37.2 C)] 98.5 F (36.9 C) (09/19 0752) Pulse Rate:  [59-65] 65 (09/19 0752) Resp:  [18-20] 20 (09/19 0752) BP: (133-155)/(85-94) 140/86 (09/19 0533) SpO2:  [94 %-97 %] 96 % (09/19 0533) Weight:  [82.5 kg] 82.5 kg (09/18 1744) Last BM Date : 12/01/22  Intake/Output from previous day: 09/18 0701 - 09/19 0700 In: 862.9 [I.V.:764.3; IV Piggyback:98.6] Out: 750 [Urine:750] Intake/Output this shift: Total I/O In: 360 [P.O.:360] Out: 600 [Urine:600]  PE: Gen: NAD Abd: soft, tender focally in LLQ, no peritonitis, ND, +BS  Lab Results:  Recent Labs    12/01/22 1316 12/02/22 0306  WBC 11.4* 10.0  HGB 14.2 12.4*  HCT 45.1 39.3  PLT 281 225   BMET Recent Labs    12/01/22 2249 12/02/22 0306  NA 138 135  K 3.8 3.6  CL 101 104  CO2 21* 25  GLUCOSE 95 95  BUN 14 13  CREATININE 0.89 0.93  CALCIUM 8.5* 8.1*   PT/INR No results for input(s): "LABPROT", "INR" in the last 72 hours. CMP     Component Value Date/Time   NA 135 12/02/2022 0306   K 3.6 12/02/2022 0306   CL 104 12/02/2022 0306   CO2 25 12/02/2022 0306   GLUCOSE 95 12/02/2022 0306   BUN 13 12/02/2022 0306   CREATININE 0.93 12/02/2022 0306   CALCIUM 8.1 (L) 12/02/2022 0306   PROT 5.8 (L) 12/02/2022 0306   ALBUMIN 2.9 (L) 12/02/2022 0306   AST 11 (L) 12/02/2022 0306   ALT 12 12/02/2022 0306   ALKPHOS 68 12/02/2022 0306   BILITOT 1.0 12/02/2022 0306   GFRNONAA >60 12/02/2022 0306   GFRAA >60 06/25/2019 1247   Lipase     Component Value Date/Time   LIPASE 20 12/01/2022 1316       Studies/Results: CT ABDOMEN PELVIS W CONTRAST  Result Date: 12/01/2022 CLINICAL DATA:  Bilateral lower abdominal pain EXAM: CT ABDOMEN AND PELVIS WITH CONTRAST TECHNIQUE: Multidetector CT imaging of the  abdomen and pelvis was performed using the standard protocol following bolus administration of intravenous contrast. RADIATION DOSE REDUCTION: This exam was performed according to the departmental dose-optimization program which includes automated exposure control, adjustment of the mA and/or kV according to patient size and/or use of iterative reconstruction technique. CONTRAST:  OMNIPAQUE IOHEXOL 300 MG/ML  SOLN COMPARISON:  10/24/2021 FINDINGS: Lower chest: Bibasilar scarring. No acute pleural or parenchymal lung disease. Hepatobiliary: Stable hepatic cysts. No other focal liver abnormality. The gallbladder is unremarkable. No biliary duct dilation. Pancreas: Unremarkable. No pancreatic ductal dilatation or surrounding inflammatory changes. Spleen: Normal in size without focal abnormality. Adrenals/Urinary Tract: Adrenal glands are unremarkable. Kidneys are normal, without renal calculi, focal lesion, or hydronephrosis. Bladder is unremarkable. Stomach/Bowel: Distal colonic diverticulosis, with long segment wall thickening and pericolonic fat stranding involving the sigmoid colon consistent with acute diverticulitis. There is evidence of sigmoid colon perforation, with free gas throughout the abdomen and pelvis. There is a loculated gas collection overlying the left iliac vessels measuring 3.1 x 2.0 cm, but I do not see any fluid collection or abscess at this time. Normal appendix right lower quadrant. No bowel obstruction. Scattered gas fluid levels within the distal jejunum may reflect ileus given the adjacent perforated sigmoid diverticulitis. Vascular/Lymphatic: Aortic  atherosclerosis. No enlarged abdominal or pelvic lymph nodes. Reproductive: Prostate is unremarkable. Other: Pneumoperitoneum throughout the abdomen and pelvis consistent with perforated sigmoid diverticulitis. Trace pelvic free fluid. No abdominal wall hernia. Musculoskeletal: No acute or destructive bony abnormalities. Reconstructed  images demonstrate no additional findings. IMPRESSION: 1. Acute perforated sigmoid diverticulitis. Diffuse pneumoperitoneum, with loculated gas collection overlying the left iliac vessels as above. There is no drainable fluid collection or abscess at this time. 2. Scattered gas fluid levels within the distal jejunum, likely representing ileus given the inflammatory changes of the sigmoid colon described above. 3.  Aortic Atherosclerosis (ICD10-I70.0). Critical Value/emergent results were called by telephone at the time of interpretation on 12/01/2022 at 6:14 pm to provider JENISE MENSHEW , who verbally acknowledged these results. Electronically Signed   By: Sharlet Salina M.D.   On: 12/01/2022 18:20   US Abdomen Limited RUQ (LIVER/GB)  Result Date: 12/01/2022 CLINICAL DATA:  Right upper quadrant pain EXAM: ULTRASOUND ABDOMEN LIMITED RIGHT UPPER QUADRANT COMPARISON:  CT abdomen pelvis 10/24/2021 FINDINGS: Gallbladder: No gallstones or wall thickening visualized. No sonographic Murphy sign noted by sonographer. Common bile duct: Diameter: 3.4 mm Liver: No focal lesion identified. Within normal limits in parenchymal echogenicity. Portal vein is patent on color Doppler imaging with normal direction of blood flow towards the liver. Other: None. IMPRESSION: No cholelithiasis or sonographic evidence for acute cholecystitis. Electronically Signed   By: Annia Belt M.D.   On: 12/01/2022 14:35    Anti-infectives: Anti-infectives (From admission, onward)    Start     Dose/Rate Route Frequency Ordered Stop   12/02/22 1100  metroNIDAZOLE (FLAGYL) IVPB 500 mg  Status:  Discontinued       Placed in "And" Linked Group   500 mg 100 mL/hr over 60 Minutes Intravenous Every 12 hours 12/01/22 2311 12/01/22 2343   12/02/22 0600  piperacillin-tazobactam (ZOSYN) IVPB 3.375 g        3.375 g 12.5 mL/hr over 240 Minutes Intravenous Every 8 hours 12/01/22 2343     12/01/22 2315  ceFEPIme (MAXIPIME) 2 g in sodium chloride 0.9 %  100 mL IVPB  Status:  Discontinued       Placed in "And" Linked Group   2 g 200 mL/hr over 30 Minutes Intravenous  Once 12/01/22 2311 12/01/22 2343   12/01/22 2215  ceFEPIme (MAXIPIME) 2 g in sodium chloride 0.9 % 100 mL IVPB       Placed in "And" Linked Group   2 g 200 mL/hr over 30 Minutes Intravenous  Once 12/01/22 2205 12/02/22 0000   12/01/22 2215  metroNIDAZOLE (FLAGYL) IVPB 500 mg  Status:  Discontinued       Placed in "And" Linked Group   500 mg 100 mL/hr over 60 Minutes Intravenous  Once 12/01/22 2205 12/01/22 2343        Assessment/Plan Perforated diverticulitis, Jehovah's witness -no labs today, but WBC normalized yesterday -AF, VSS - adv to CLD -continue IV abx therapy for now -cont to try and get better with conservative management -will need colonoscopy as outpatient.   FEN - CLD/IVFs VTE - SCDs ID - zosyn  I reviewed hospitalist notes, last 24 h vitals and pain scores, last 48 h intake and output, last 24 h labs and trends, and last 24 h imaging results.   LOS: 2 days    Letha Cape , [facility] 12/03/2022, 12:21 PM Please see Amion for pager number during day hours 7:00am-4:30pm or 7:00am -11:30am on weekends

## 2022-12-03 NOTE — Telephone Encounter (Signed)
Pt called requesting Dugal to call him back to discuss general questions? Pt wouldn't state his questions or his concerns he wanted to discuss with Dugal. Pt mentioned he was currently in the hosp. Call back # [phone]

## 2022-12-03 NOTE — Telephone Encounter (Signed)
Spoke with pt. He is currently admitted at [facility]. Attempted to get some information from him to relay to [place] but he stated that he just had some "general" questions for her and would appreciate a call.

## 2022-12-04 DIAGNOSIS — K5720 Diverticulitis of large intestine with perforation and abscess without bleeding: Secondary | ICD-10-CM | POA: Diagnosis not present

## 2022-12-04 LAB — BASIC METABOLIC PANEL
Anion gap: 9 (ref 5–15)
BUN: 9 mg/dL (ref 6–20)
CO2: 25 mmol/L (ref 22–32)
Calcium: 8.6 mg/dL — ABNORMAL LOW (ref 8.9–10.3)
Chloride: 104 mmol/L (ref 98–111)
Creatinine, Ser: 1.1 mg/dL (ref 0.61–1.24)
GFR, Estimated: >60 mL/min (ref >60)
Glucose, Bld: 103 mg/dL — ABNORMAL HIGH (ref 70–99)
Potassium: 3.5 mmol/L (ref 3.5–5.1)
Sodium: 138 mmol/L (ref 135–145)

## 2022-12-04 LAB — CBC
HCT: 45.3 % (ref 39.0–52.0)
Hemoglobin: 13.9 g/dL (ref 13.0–17.0)
MCH: 25.6 pg — ABNORMAL LOW (ref 26.0–34.0)
MCHC: 30.7 g/dL (ref 30.0–36.0)
MCV: 83.4 fL (ref 80.0–100.0)
Platelets: 319 10*3/uL (ref 150–400)
RBC: 5.43 MIL/uL (ref 4.22–5.81)
RDW: 15.7 % — ABNORMAL HIGH (ref 11.5–15.5)
WBC: 8.1 10*3/uL (ref 4.0–10.5)
nRBC: 0 % (ref 0.0–0.2)

## 2022-12-04 NOTE — TOC CM/SW Note (Signed)
Transition of Care [facility]) - Inpatient Brief Assessment   Patient Details  Name: Marcus Andrews MRN: [id number] Date of Birth: July 05, 1960  [facility]) CM/SW Contact:    Gala Lewandowsky, RN Phone Number: 12/04/2022, 3:34 PM   Clinical Narrative: Patient presented for diverticulitis. Patient has insurance and PCP. Case Manager will continue to follow for transition of care needs as the patient progresses.    Transition of Care Asessment: Insurance and Status: Insurance coverage has been reviewed Patient has primary care physician: Yes Prior/Current Home Services: No current home services Social Determinants of Health Reivew: SDOH reviewed no interventions necessary Readmission risk has been reviewed: Yes Transition of care needs: no transition of care needs at this time

## 2022-12-04 NOTE — Progress Notes (Signed)
Pharmacy Antibiotic Note  Marcus Andrews is a 62 y.o. male admitted on 12/01/2022 with abdominal pain in left/right lower quadrants since Sunday. Patient reports history of diverticulitis. Imaging shows acute perforated sigmoid diverticulitis, no fluid collection or abscess noted.  Pharmacy has been consulted for zosyn dosing.  -WBC 9.9, CrCl ~ 90  Plan: Zosyn 3.375gm IV every 8 hours -Will follow renal function and clinical progress    Height: 6' (182.9 cm) Weight: 82.5 kg (181 lb 14.1 oz) IBW/kg (Calculated) : 77.6  Temp (24hrs), Avg:98.4 F (36.9 C), Min:97.9 F (36.6 C), Max:98.7 F (37.1 C)  Recent Labs  Lab 12/01/22 1316 12/01/22 2249 12/02/22 0306 12/03/22 1228  WBC 11.4*  --  10.0 9.9  CREATININE 0.74 0.89 0.93 0.97    Estimated Creatinine Clearance: 88.9 mL/min (by C-G formula based on SCr of 0.97 mg/dL).    No Known Allergies  Antimicrobials this admission: Cefepime/flagyl x1   Zosyn 9/18 >>   Thank you for allowing pharmacy to be a part of this patient's care.  Harland German, PharmD Clinical Pharmacist **Pharmacist phone directory can now be found on [email or web address] ([email or web address]).  Listed under [facility].

## 2022-12-04 NOTE — Progress Notes (Signed)
Subjective: Pain much better today and tolerating CLD.  Pain around a 2 or 3.  Passing flatus, no BM yet.  ROS: See above, otherwise other systems negative  Objective: Vital signs in last 24 hours: Temp:  [97.9 F (36.6 C)-98.7 F (37.1 C)] 97.9 F (36.6 C) (09/20 0415) Pulse Rate:  [60-68] 60 (09/20 0415) Resp:  [16-18] 18 (09/20 0415) BP: (132-144)/(90-96) 132/94 (09/20 0415) SpO2:  [98 %] 98 % (09/19 2012) Last BM Date : 12/01/22  Intake/Output from previous day: 09/19 0701 - 09/20 0700 In: 840 [P.O.:840] Out: 2200 [Urine:2200] Intake/Output this shift: No intake/output data recorded.  PE: Gen: NAD Abd: soft, tender focally in LLQ still but much less tender today, no peritonitis, ND, +BS  Lab Results:  Recent Labs    12/02/22 0306 12/03/22 1228  WBC 10.0 9.9  HGB 12.4* 14.6  HCT 39.3 44.7  PLT 225 295   BMET Recent Labs    12/02/22 0306 12/03/22 1228  NA 135 138  K 3.6 3.5  CL 104 101  CO2 25 25  GLUCOSE 95 81  BUN 13 15  CREATININE 0.93 0.97  CALCIUM 8.1* 8.8*   PT/INR No results for input(s): "LABPROT", "INR" in the last 72 hours. CMP     Component Value Date/Time   NA 138 12/03/2022 1228   K 3.5 12/03/2022 1228   CL 101 12/03/2022 1228   CO2 25 12/03/2022 1228   GLUCOSE 81 12/03/2022 1228   BUN 15 12/03/2022 1228   CREATININE 0.97 12/03/2022 1228   CALCIUM 8.8 (L) 12/03/2022 1228   PROT 5.8 (L) 12/02/2022 0306   ALBUMIN 2.9 (L) 12/02/2022 0306   AST 11 (L) 12/02/2022 0306   ALT 12 12/02/2022 0306   ALKPHOS 68 12/02/2022 0306   BILITOT 1.0 12/02/2022 0306   GFRNONAA >60 12/03/2022 1228   GFRAA >60 06/25/2019 1247   Lipase     Component Value Date/Time   LIPASE 20 12/01/2022 1316       Studies/Results: No results found.  Anti-infectives: Anti-infectives (From admission, onward)    Start     Dose/Rate Route Frequency Ordered Stop   12/02/22 1100  metroNIDAZOLE (FLAGYL) IVPB 500 mg  Status:  Discontinued        Placed in "And" Linked Group   500 mg 100 mL/hr over 60 Minutes Intravenous Every 12 hours 12/01/22 2311 12/01/22 2343   12/02/22 0600  piperacillin-tazobactam (ZOSYN) IVPB 3.375 g        3.375 g 12.5 mL/hr over 240 Minutes Intravenous Every 8 hours 12/01/22 2343     12/01/22 2315  ceFEPIme (MAXIPIME) 2 g in sodium chloride 0.9 % 100 mL IVPB  Status:  Discontinued       Placed in "And" Linked Group   2 g 200 mL/hr over 30 Minutes Intravenous  Once 12/01/22 2311 12/01/22 2343   12/01/22 2215  ceFEPIme (MAXIPIME) 2 g in sodium chloride 0.9 % 100 mL IVPB       Placed in "And" Linked Group   2 g 200 mL/hr over 30 Minutes Intravenous  Once 12/01/22 2205 12/02/22 0000   12/01/22 2215  metroNIDAZOLE (FLAGYL) IVPB 500 mg  Status:  Discontinued       Placed in "And" Linked Group   500 mg 100 mL/hr over 60 Minutes Intravenous  Once 12/01/22 2205 12/01/22 2343        Assessment/Plan Perforated diverticulitis, Jehovah's witness -no labs yet today, but WBC normal yesterday -AF,  VSS - adv to FLD -continue IV abx therapy for now -cont to try and get better with conservative management -will need colonoscopy as outpatient.   FEN - FLD/IVFs VTE - SCDs ID - zosyn  I reviewed hospitalist notes, last 24 h vitals and pain scores, last 48 h intake and output, last 24 h labs and trends, and last 24 h imaging results.   LOS: 3 days    Letha Cape , [facility] 12/04/2022, 10:27 AM Please see Amion for pager number during day hours 7:00am-4:30pm or 7:00am -11:30am on weekends

## 2022-12-04 NOTE — Progress Notes (Signed)
[facility]                                                                               Marcus Andrews, is a 62 y.o. male, DOB - June 06, 1960, WUJ:[id number] Admit date - 12/01/2022    Outpatient Primary MD for the patient is Mort Sawyers, FNP  LOS - 3  days    Brief summary   62 y.o. male with medical history significant of diverticulitis, Presented with   abd pain, was found to have microperforation. General surgery consulted for recommendations.    Assessment & Plan    Assessment and Plan:   Acute diverticulitis with pneumoperitoneum without any abscess Patient started on IV antibiotics and general surgery consulted.   Continue with IV fluids.  Pain control . Patient continues to have mild to mod LLQ pain, but improving.  He was started on clears and able to tolerate.  Advanced diet to full liquids today.  Conservative management.     Estimated body mass index is 24.67 kg/m as calculated from the following:   Height as of this encounter: 6' (1.829 m).   Weight as of this encounter: 82.5 kg.  Code Status: full code.  DVT Prophylaxis:  SCDs Start: 12/02/22 0000   Level of Care: Level of care: Progressive Family Communication: none at bedside   Disposition Plan:     Remains inpatient appropriate:  IV antibiotics.    Procedures:  None.   Consultants:   General surgery.   Antimicrobials:   Anti-infectives (From admission, onward)    Start     Dose/Rate Route Frequency Ordered Stop   12/02/22 1100  metroNIDAZOLE (FLAGYL) IVPB 500 mg  Status:  Discontinued       Placed in "And" Linked Group   500 mg 100 mL/hr over 60 Minutes Intravenous Every 12 hours 12/01/22 2311 12/01/22 2343   12/02/22 0600  piperacillin-tazobactam (ZOSYN) IVPB 3.375 g        3.375 g 12.5 mL/hr over 240 Minutes Intravenous Every 8 hours 12/01/22 2343     12/01/22 2315  ceFEPIme (MAXIPIME) 2 g in sodium chloride 0.9 % 100 mL IVPB  Status:  Discontinued       Placed in  "And" Linked Group   2 g 200 mL/hr over 30 Minutes Intravenous  Once 12/01/22 2311 12/01/22 2343   12/01/22 2215  ceFEPIme (MAXIPIME) 2 g in sodium chloride 0.9 % 100 mL IVPB       Placed in "And" Linked Group   2 g 200 mL/hr over 30 Minutes Intravenous  Once 12/01/22 2205 12/02/22 0000   12/01/22 2215  metroNIDAZOLE (FLAGYL) IVPB 500 mg  Status:  Discontinued       Placed in "And" Linked Group   500 mg 100 mL/hr over 60 Minutes Intravenous  Once 12/01/22 2205 12/01/22 2343        Medications  Scheduled Meds: Continuous Infusions:  lactated ringers 100 mL/hr at 12/04/22 1641   piperacillin-tazobactam (ZOSYN)  IV 12.5 mL/hr at 12/04/22 1641   PRN Meds:.acetaminophen **OR** acetaminophen, HYDROmorphone (DILAUDID) injection, ondansetron **OR** ondansetron (ZOFRAN) IV, mouth rinse    Subjective:   Marcus Andrews was seen and examined today.  No nausea or vomiting.   Objective:   Vitals:   12/03/22 1410 12/03/22 2012 12/04/22 0415 12/04/22 1500  BP: (!) 139/96 (!) 144/90 (!) 132/94 128/84  Pulse:  68 60 62  Resp: 16 16 18 16   Temp: 98.7 F (37.1 C) 98.5 F (36.9 C) 97.9 F (36.6 C) 99 F (37.2 C)  TempSrc: Oral Oral Oral Oral  SpO2:  98%  97%  Weight:      Height:        Intake/Output Summary (Last 24 hours) at 12/04/2022 1737 Last data filed at 12/04/2022 1641 Gross per 24 hour  Intake 4033.85 ml  Output 1600 ml  Net 2433.85 ml   Filed Weights   12/01/22 2052 12/02/22 1744  Weight: 82.6 kg 82.5 kg     Exam General exam: Appears calm and comfortable  Respiratory system: Clear to auscultation. Respiratory effort normal. Cardiovascular system: S1 & S2 heard, RRR.  Gastrointestinal system: Abdomen is soft , mild to mod to LLQ area bs+. Central nervous system: Alert and oriented. No focal neurological deficits. Extremities: Symmetric 5 x 5 power. Skin: No rashes, lesions or ulcers Psychiatry: Judgement and insight appear normal. Mood & affect appropriate.      Data Reviewed:  I have personally reviewed following labs and imaging studies   CBC Lab Results  Component Value Date   WBC 8.1 12/04/2022   RBC 5.43 12/04/2022   HGB 13.9 12/04/2022   HCT 45.3 12/04/2022   MCV 83.4 12/04/2022   MCH 25.6 (L) 12/04/2022   PLT 319 12/04/2022   MCHC 30.7 12/04/2022   RDW 15.7 (H) 12/04/2022   LYMPHSABS 1.3 12/03/2022   MONOABS 0.6 12/03/2022   EOSABS 0.2 12/03/2022   BASOSABS 0.0 12/03/2022     Last metabolic panel Lab Results  Component Value Date   NA 138 12/04/2022   K 3.5 12/04/2022   CL 104 12/04/2022   CO2 25 12/04/2022   BUN 9 12/04/2022   CREATININE 1.10 12/04/2022   GLUCOSE 103 (H) 12/04/2022   GFRNONAA >60 12/04/2022   GFRAA >60 06/25/2019   CALCIUM 8.6 (L) 12/04/2022   PHOS 3.7 12/02/2022   PROT 5.8 (L) 12/02/2022   ALBUMIN 2.9 (L) 12/02/2022   BILITOT 1.0 12/02/2022   ALKPHOS 68 12/02/2022   AST 11 (L) 12/02/2022   ALT 12 12/02/2022   ANIONGAP 9 12/04/2022    CBG (last 3)  No results for input(s): "GLUCAP" in the last 72 hours.    Coagulation Profile: No results for input(s): "INR", "PROTIME" in the last 168 hours.   Radiology Studies: No results found.     Kathlen Mody M.D. [facility] 12/04/2022, 5:37 PM  Available via Epic secure chat 7am-7pm After 7 pm, please refer to night coverage provider listed on amion.

## 2022-12-04 NOTE — Discharge Instructions (Signed)
Low fiber diet for 4 weeks and then high fiber diet to follow

## 2022-12-05 DIAGNOSIS — K5720 Diverticulitis of large intestine with perforation and abscess without bleeding: Secondary | ICD-10-CM | POA: Diagnosis not present

## 2022-12-05 LAB — CBC WITH DIFFERENTIAL/PLATELET
Abs Immature Granulocytes: 0.03 10*3/uL (ref 0.00–0.07)
Basophils Absolute: 0 10*3/uL (ref 0.0–0.1)
Basophils Relative: 0 %
Eosinophils Absolute: 0.4 10*3/uL (ref 0.0–0.5)
Eosinophils Relative: 5 %
HCT: 40.4 % (ref 39.0–52.0)
Hemoglobin: 12.9 g/dL — ABNORMAL LOW (ref 13.0–17.0)
Immature Granulocytes: 0 %
Lymphocytes Relative: 18 %
Lymphs Abs: 1.4 10*3/uL (ref 0.7–4.0)
MCH: 26.4 pg (ref 26.0–34.0)
MCHC: 31.9 g/dL (ref 30.0–36.0)
MCV: 82.8 fL (ref 80.0–100.0)
Monocytes Absolute: 0.6 10*3/uL (ref 0.1–1.0)
Monocytes Relative: 9 %
Neutro Abs: 5 10*3/uL (ref 1.7–7.7)
Neutrophils Relative %: 68 %
Platelets: 274 10*3/uL (ref 150–400)
RBC: 4.88 MIL/uL (ref 4.22–5.81)
RDW: 15.6 % — ABNORMAL HIGH (ref 11.5–15.5)
WBC: 7.5 10*3/uL (ref 4.0–10.5)
nRBC: 0 % (ref 0.0–0.2)

## 2022-12-05 LAB — BASIC METABOLIC PANEL
Anion gap: 5 (ref 5–15)
BUN: 6 mg/dL (ref 6–20)
CO2: 25 mmol/L (ref 22–32)
Calcium: 8.6 mg/dL — ABNORMAL LOW (ref 8.9–10.3)
Chloride: 106 mmol/L (ref 98–111)
Creatinine, Ser: 0.96 mg/dL (ref 0.61–1.24)
GFR, Estimated: >60 mL/min (ref >60)
Glucose, Bld: 113 mg/dL — ABNORMAL HIGH (ref 70–99)
Potassium: 4.1 mmol/L (ref 3.5–5.1)
Sodium: 136 mmol/L (ref 135–145)

## 2022-12-05 MED ORDER — ENOXAPARIN SODIUM 40 MG/0.4ML IJ SOSY: 40.0000 mg | PREFILLED_SYRINGE | INTRAMUSCULAR | Status: DC

## 2022-12-05 NOTE — Progress Notes (Signed)
[facility]                                                                               Marcus Andrews, is a 62 y.o. male, DOB - 06-16-1960, ZOX:[id number] Admit date - 12/01/2022    Outpatient Primary MD for the patient is Mort Sawyers, FNP  LOS - 4  days    Brief summary   62 y.o. male with medical history significant of diverticulitis, Presented with   abd pain, was found to have microperforation. General surgery consulted for recommendations.    Assessment & Plan    Assessment and Plan:   Acute diverticulitis with pneumoperitoneum without any abscess Patient started on IV antibiotics and general surgery consulted.   Pain control . Patient continues to have mild to mod LLQ pain, but improving. No nauea or vomiting.  He was started on clears and able to tolerate.  Advanced diet to full liquids , GS recommended to continue with full liquids for another 24 hours.  Plan for repeat colonoscopy outpatient when this is healed.  He remains afebrile and wbc count wnl.   Conservative management.     Estimated body mass index is 24.67 kg/m as calculated from the following:   Height as of this encounter: 6' (1.829 m).   Weight as of this encounter: 82.5 kg.  Code Status: full code.  DVT Prophylaxis:  enoxaparin (LOVENOX) injection 40 mg Start: 12/05/22 1400 SCDs Start: 12/02/22 0000   Level of Care: Level of care: Progressive Family Communication: none at bedside   Disposition Plan:     Remains inpatient appropriate:  IV antibiotics.    Procedures:  None.   Consultants:   General surgery.   Antimicrobials:   Anti-infectives (From admission, onward)    Start     Dose/Rate Route Frequency Ordered Stop   12/02/22 1100  metroNIDAZOLE (FLAGYL) IVPB 500 mg  Status:  Discontinued       Placed in "And" Linked Group   500 mg 100 mL/hr over 60 Minutes Intravenous Every 12 hours 12/01/22 2311 12/01/22 2343   12/02/22 0600  piperacillin-tazobactam (ZOSYN)  IVPB 3.375 g        3.375 g 12.5 mL/hr over 240 Minutes Intravenous Every 8 hours 12/01/22 2343     12/01/22 2315  ceFEPIme (MAXIPIME) 2 g in sodium chloride 0.9 % 100 mL IVPB  Status:  Discontinued       Placed in "And" Linked Group   2 g 200 mL/hr over 30 Minutes Intravenous  Once 12/01/22 2311 12/01/22 2343   12/01/22 2215  ceFEPIme (MAXIPIME) 2 g in sodium chloride 0.9 % 100 mL IVPB       Placed in "And" Linked Group   2 g 200 mL/hr over 30 Minutes Intravenous  Once 12/01/22 2205 12/02/22 0000   12/01/22 2215  metroNIDAZOLE (FLAGYL) IVPB 500 mg  Status:  Discontinued       Placed in "And" Linked Group   500 mg 100 mL/hr over 60 Minutes Intravenous  Once 12/01/22 2205 12/01/22 2343        Medications  Scheduled Meds:  enoxaparin (LOVENOX) injection  40 mg Subcutaneous Q24H   Continuous Infusions:  lactated ringers 100 mL/hr at 12/05/22 0816   piperacillin-tazobactam (ZOSYN)  IV 3.375 g (12/05/22 1504)   PRN Meds:.acetaminophen **OR** acetaminophen, HYDROmorphone (DILAUDID) injection, ondansetron **OR** ondansetron (ZOFRAN) IV, mouth rinse    Subjective:   Marcus Andrews was seen and examined today.  Abd pain slowly improving. No nausea or vomiting.   Objective:   Vitals:   12/04/22 0415 12/04/22 1500 12/04/22 2100 12/05/22 0618  BP: (!) 132/94 128/84 138/88 128/85  Pulse: 60 62 61 66  Resp: 18 16 16 18   Temp: 97.9 F (36.6 C) 99 F (37.2 C) 98.5 F (36.9 C) 98.8 F (37.1 C)  TempSrc: Oral Oral Oral Oral  SpO2:  97% 94% 98%  Weight:      Height:        Intake/Output Summary (Last 24 hours) at 12/05/2022 1704 Last data filed at 12/05/2022 0618 Gross per 24 hour  Intake 599.12 ml  Output 525 ml  Net 74.12 ml   Filed Weights   12/01/22 2052 12/02/22 1744  Weight: 82.6 kg 82.5 kg     Exam General exam: Appears calm and comfortable  Respiratory system: Clear to auscultation. Respiratory effort normal. Cardiovascular system: S1 & S2 heard, RRR.   Gastrointestinal system: Abdomen is soft, mild tenderness in the LLQ.  Central nervous system: Alert and oriented. No focal neurological deficits. Extremities: Symmetric 5 x 5 power. Skin: No rashes,  Psychiatry: Mood & affect appropriate.      Data Reviewed:  I have personally reviewed following labs and imaging studies   CBC Lab Results  Component Value Date   WBC 7.5 12/05/2022   RBC 4.88 12/05/2022   HGB 12.9 (L) 12/05/2022   HCT 40.4 12/05/2022   MCV 82.8 12/05/2022   MCH 26.4 12/05/2022   PLT 274 12/05/2022   MCHC 31.9 12/05/2022   RDW 15.6 (H) 12/05/2022   LYMPHSABS 1.4 12/05/2022   MONOABS 0.6 12/05/2022   EOSABS 0.4 12/05/2022   BASOSABS 0.0 12/05/2022     Last metabolic panel Lab Results  Component Value Date   NA 136 12/05/2022   K 4.1 12/05/2022   CL 106 12/05/2022   CO2 25 12/05/2022   BUN 6 12/05/2022   CREATININE 0.96 12/05/2022   GLUCOSE 113 (H) 12/05/2022   GFRNONAA >60 12/05/2022   GFRAA >60 06/25/2019   CALCIUM 8.6 (L) 12/05/2022   PHOS 3.7 12/02/2022   PROT 5.8 (L) 12/02/2022   ALBUMIN 2.9 (L) 12/02/2022   BILITOT 1.0 12/02/2022   ALKPHOS 68 12/02/2022   AST 11 (L) 12/02/2022   ALT 12 12/02/2022   ANIONGAP 5 12/05/2022    CBG (last 3)  No results for input(s): "GLUCAP" in the last 72 hours.    Coagulation Profile: No results for input(s): "INR", "PROTIME" in the last 168 hours.   Radiology Studies: No results found.     Kathlen Mody M.D. [facility] 12/05/2022, 5:04 PM  Available via Epic secure chat 7am-7pm After 7 pm, please refer to night coverage provider listed on amion.

## 2022-12-05 NOTE — Plan of Care (Signed)

## 2022-12-05 NOTE — Progress Notes (Signed)
Assessment & Plan: HD#5 - perforated diverticulitis, Jehovah's witness - WBC 7.5 - tolerating FLD, BM this morning - would continue FLD for now, persistent tenderness on exam - IV Zosyn - encourage OOB, ambulation   FEN - FLD/IVFs VTE - SCDs ID - zosyn        [name], MD [facility] practice Office: [phone]        Chief Complaint: Perforated diverticulitis  Subjective: Patient comfortable in bed, BM this AM per patient.  Objective: Vital signs in last 24 hours: Temp:  [98.5 F (36.9 C)-99 F (37.2 C)] 98.8 F (37.1 C) (09/21 0618) Pulse Rate:  [61-66] 66 (09/21 0618) Resp:  [16-18] 18 (09/21 0618) BP: (128-138)/(84-88) 128/85 (09/21 0618) SpO2:  [94 %-98 %] 98 % (09/21 0618) Last BM Date : 12/05/22  Intake/Output from previous day: 09/20 0701 - 09/21 0700 In: 4153 [P.O.:240; I.V.:3561.6; IV Piggyback:351.4] Out: 525 [Urine:525] Intake/Output this shift: No intake/output data recorded.  Physical Exam: HEENT - sclerae clear, mucous membranes moist Neck - soft Abdomen - soft without distension; mild to moderate tenderness left mid abdomen with guarding Ext - no edema, non-tender  Lab Results:  Recent Labs    12/04/22 1020 12/05/22 0415  WBC 8.1 7.5  HGB 13.9 12.9*  HCT 45.3 40.4  PLT 319 274   BMET Recent Labs    12/04/22 1020 12/05/22 0415  NA 138 136  K 3.5 4.1  CL 104 106  CO2 25 25  GLUCOSE 103* 113*  BUN 9 6  CREATININE 1.10 0.96  CALCIUM 8.6* 8.6*   PT/INR No results for input(s): "LABPROT", "INR" in the last 72 hours. Comprehensive Metabolic Panel:    Component Value Date/Time   NA 136 12/05/2022 0415   NA 138 12/04/2022 1020   K 4.1 12/05/2022 0415   K 3.5 12/04/2022 1020   CL 106 12/05/2022 0415   CL 104 12/04/2022 1020   CO2 25 12/05/2022 0415   CO2 25 12/04/2022 1020   BUN 6 12/05/2022 0415   BUN 9 12/04/2022 1020   CREATININE 0.96 12/05/2022 0415   CREATININE 1.10 12/04/2022 1020    GLUCOSE 113 (H) 12/05/2022 0415   GLUCOSE 103 (H) 12/04/2022 1020   CALCIUM 8.6 (L) 12/05/2022 0415   CALCIUM 8.6 (L) 12/04/2022 1020   AST 11 (L) 12/02/2022 0306   AST 15 12/01/2022 1316   ALT 12 12/02/2022 0306   ALT 15 12/01/2022 1316   ALKPHOS 68 12/02/2022 0306   ALKPHOS 84 12/01/2022 1316   BILITOT 1.0 12/02/2022 0306   BILITOT 1.2 12/01/2022 1316   PROT 5.8 (L) 12/02/2022 0306   PROT 7.4 12/01/2022 1316   ALBUMIN 2.9 (L) 12/02/2022 0306   ALBUMIN 4.0 12/01/2022 1316    Studies/Results: No results found.    [name] 12/05/2022  Patient ID: Marcus Andrews, male   DOB: Jun 26, 1960, 62 y.o.   MRN: [id number]

## 2022-12-05 NOTE — Progress Notes (Signed)
Patient recovering nicely from perforated diverticulitis on IV Zosyn. Dr. Gerrit Friends requested an outpatient colonoscopy in ~ 6 weeks. Colonoscopy in June 2021 revealed moderate left colon diverticulosis, 2 hyperplastic polyps and internal hemorrhoids.  My office will contact the patient early next week to arrange an outpatient colonoscopy.

## 2022-12-06 DIAGNOSIS — K5720 Diverticulitis of large intestine with perforation and abscess without bleeding: Secondary | ICD-10-CM | POA: Diagnosis not present

## 2022-12-06 MED ORDER — PHENYLEPHRINE-MINERAL OIL-PET 0.25-14-74.9 % RE OINT
1.0000 | TOPICAL_OINTMENT | Freq: Two times a day (BID) | RECTAL | Status: DC | PRN
  Filled 2022-12-06: qty 57

## 2022-12-06 MED ORDER — WITCH HAZEL-GLYCERIN EX PADS
MEDICATED_PAD | CUTANEOUS | Status: DC | PRN
  Filled 2022-12-06: qty 100

## 2022-12-06 NOTE — Progress Notes (Signed)
Assessment & Plan: HD#6 - perforated diverticulitis, Jehovah's witness - tolerating FLD, BM this morning, advance to soft diet today - mild tenderness on exam LLQ, monitor - IV Zosyn - encourage OOB, ambulation   FEN - Soft/IVFs VTE - SCDs ID - zosyn  Plan to continue IV Zosyn today and advance diet.  If tolerated and abdominal exam improves, likely home tomorrow on one week of oral abx.  Appreciate visit by Dr. Russella Dar who will arrange follow up colonoscopy in 6 weeks.        [name], MD [facility] practice Office: [phone]        Chief Complaint: diverticulitis  Subjective: Patient in bed, comfortable.  Tolerating full liquid diet.  Objective: Vital signs in last 24 hours: Temp:  [98 F (36.7 C)-98.8 F (37.1 C)] 98.5 F (36.9 C) (09/22 0401) Pulse Rate:  [55-81] 56 (09/22 0401) Resp:  [16-18] 18 (09/22 0401) BP: (131-132)/(82-90) 132/82 (09/22 0401) SpO2:  [93 %-100 %] 97 % (09/22 0401) Last BM Date : 12/05/22  Intake/Output from previous day: 09/21 0701 - 09/22 0700 In: 2947.8 [P.O.:240; I.V.:2657.8; IV Piggyback:50] Out: -  Intake/Output this shift: No intake/output data recorded.  Physical Exam: HEENT - sclerae clear, mucous membranes moist Abdomen - soft without distension; mild tenderness deep LLQ palpation; no guarding; no mass Ext - no edema, non-tender  Lab Results:  Recent Labs    12/04/22 1020 12/05/22 0415  WBC 8.1 7.5  HGB 13.9 12.9*  HCT 45.3 40.4  PLT 319 274   BMET Recent Labs    12/04/22 1020 12/05/22 0415  NA 138 136  K 3.5 4.1  CL 104 106  CO2 25 25  GLUCOSE 103* 113*  BUN 9 6  CREATININE 1.10 0.96  CALCIUM 8.6* 8.6*   PT/INR No results for input(s): "LABPROT", "INR" in the last 72 hours. Comprehensive Metabolic Panel:    Component Value Date/Time   NA 136 12/05/2022 0415   NA 138 12/04/2022 1020   K 4.1 12/05/2022 0415   K 3.5 12/04/2022 1020   CL 106 12/05/2022 0415   CL 104  12/04/2022 1020   CO2 25 12/05/2022 0415   CO2 25 12/04/2022 1020   BUN 6 12/05/2022 0415   BUN 9 12/04/2022 1020   CREATININE 0.96 12/05/2022 0415   CREATININE 1.10 12/04/2022 1020   GLUCOSE 113 (H) 12/05/2022 0415   GLUCOSE 103 (H) 12/04/2022 1020   CALCIUM 8.6 (L) 12/05/2022 0415   CALCIUM 8.6 (L) 12/04/2022 1020   AST 11 (L) 12/02/2022 0306   AST 15 12/01/2022 1316   ALT 12 12/02/2022 0306   ALT 15 12/01/2022 1316   ALKPHOS 68 12/02/2022 0306   ALKPHOS 84 12/01/2022 1316   BILITOT 1.0 12/02/2022 0306   BILITOT 1.2 12/01/2022 1316   PROT 5.8 (L) 12/02/2022 0306   PROT 7.4 12/01/2022 1316   ALBUMIN 2.9 (L) 12/02/2022 0306   ALBUMIN 4.0 12/01/2022 1316    Studies/Results: No results found.    [name] 12/06/2022  Patient ID: Marcus Andrews, male   DOB: 07-13-1960, 62 y.o.   MRN: [id number]

## 2022-12-06 NOTE — Progress Notes (Signed)
[facility]                                                                               Ludwig Shuey, is a 62 y.o. male, DOB - 1960/06/13, WUJ:[id number] Admit date - 12/01/2022    Outpatient Primary MD for the patient is Dugal, Tabitha, FNP  LOS - 5  days    Brief summary   62 y.o. male with medical history significant of diverticulitis, Presented with   abd pain, was found to have microperforation. General surgery consulted for recommendations.    Assessment & Plan    Assessment and Plan:   Acute diverticulitis with pneumoperitoneum without any abscess Patient started on IV antibiotics and general surgery consulted.   Pain control . Patient continues to have mild to mod LLQ pain, but improving. No nauea or vomiting.  He was started on clears and able to tolerate.  Advanced diet to soft diet.  Plan for repeat colonoscopy outpatient when this is healed.  He remains afebrile and wbc count wnl.   Conservative management.     Estimated body mass index is 24.67 kg/m as calculated from the following:   Height as of this encounter: 6' (1.829 m).   Weight as of this encounter: 82.5 kg.  Code Status: full code.  DVT Prophylaxis:  enoxaparin (LOVENOX) injection 40 mg Start: 12/05/22 1400 SCDs Start: 12/02/22 0000   Level of Care: Level of care: Progressive Family Communication: none at bedside   Disposition Plan:     Remains inpatient appropriate:  IV antibiotics.    Procedures:  None.   Consultants:   General surgery.   Antimicrobials:   Anti-infectives (From admission, onward)    Start     Dose/Rate Route Frequency Ordered Stop   12/02/22 1100  metroNIDAZOLE (FLAGYL) IVPB 500 mg  Status:  Discontinued       Placed in "And" Linked Group   500 mg 100 mL/hr over 60 Minutes Intravenous Every 12 hours 12/01/22 2311 12/01/22 2343   12/02/22 0600  piperacillin-tazobactam (ZOSYN) IVPB 3.375 g        3.375 g 12.5 mL/hr over 240 Minutes Intravenous  Every 8 hours 12/01/22 2343     12/01/22 2315  ceFEPIme (MAXIPIME) 2 g in sodium chloride 0.9 % 100 mL IVPB  Status:  Discontinued       Placed in "And" Linked Group   2 g 200 mL/hr over 30 Minutes Intravenous  Once 12/01/22 2311 12/01/22 2343   12/01/22 2215  ceFEPIme (MAXIPIME) 2 g in sodium chloride 0.9 % 100 mL IVPB       Placed in "And" Linked Group   2 g 200 mL/hr over 30 Minutes Intravenous  Once 12/01/22 2205 12/02/22 0000   12/01/22 2215  metroNIDAZOLE (FLAGYL) IVPB 500 mg  Status:  Discontinued       Placed in "And" Linked Group   500 mg 100 mL/hr over 60 Minutes Intravenous  Once 12/01/22 2205 12/01/22 2343        Medications  Scheduled Meds:  enoxaparin (LOVENOX) injection  40 mg Subcutaneous Q24H   Continuous Infusions:  lactated ringers 100 mL/hr at 12/06/22 1419   piperacillin-tazobactam (ZOSYN)  IV 3.375 g (12/06/22 1421)   PRN Meds:.acetaminophen **OR** acetaminophen, HYDROmorphone (DILAUDID) injection, ondansetron **OR** ondansetron (ZOFRAN) IV, mouth rinse    Subjective:   Quandarius Higgens was seen and examined today.   No nausea, vomiting. Abd pain is improving.   Objective:   Vitals:   12/05/22 1711 12/05/22 2128 12/06/22 0401 12/06/22 1429  BP: 131/83 (!) 131/90 132/82   Pulse: (!) 55 81 (!) 56   Resp: 18 16 18 19   Temp: 98.8 F (37.1 C) 98 F (36.7 C) 98.5 F (36.9 C) 98.7 F (37.1 C)  TempSrc: Oral Oral Oral Oral  SpO2: 100% 93% 97%   Weight:      Height:        Intake/Output Summary (Last 24 hours) at 12/06/2022 1857 Last data filed at 12/06/2022 0411 Gross per 24 hour  Intake 2947.84 ml  Output --  Net 2947.84 ml   Filed Weights   12/01/22 2052 12/02/22 1744  Weight: 82.6 kg 82.5 kg     Exam General exam: Appears calm and comfortable  Respiratory system: Clear to auscultation. Respiratory effort normal. Cardiovascular system: S1 & S2 heard, RRR. No JVD,  Gastrointestinal system: Abdomen is soft , mildly tender.  Central  nervous system: Alert and oriented.  Extremities: Symmetric 5 x 5 power. Skin: No rashes,  Psychiatry: Mood & affect appropriate.       Data Reviewed:  I have personally reviewed following labs and imaging studies   CBC Lab Results  Component Value Date   WBC 7.5 12/05/2022   RBC 4.88 12/05/2022   HGB 12.9 (L) 12/05/2022   HCT 40.4 12/05/2022   MCV 82.8 12/05/2022   MCH 26.4 12/05/2022   PLT 274 12/05/2022   MCHC 31.9 12/05/2022   RDW 15.6 (H) 12/05/2022   LYMPHSABS 1.4 12/05/2022   MONOABS 0.6 12/05/2022   EOSABS 0.4 12/05/2022   BASOSABS 0.0 12/05/2022     Last metabolic panel Lab Results  Component Value Date   NA 136 12/05/2022   K 4.1 12/05/2022   CL 106 12/05/2022   CO2 25 12/05/2022   BUN 6 12/05/2022   CREATININE 0.96 12/05/2022   GLUCOSE 113 (H) 12/05/2022   GFRNONAA >60 12/05/2022   GFRAA >60 06/25/2019   CALCIUM 8.6 (L) 12/05/2022   PHOS 3.7 12/02/2022   PROT 5.8 (L) 12/02/2022   ALBUMIN 2.9 (L) 12/02/2022   BILITOT 1.0 12/02/2022   ALKPHOS 68 12/02/2022   AST 11 (L) 12/02/2022   ALT 12 12/02/2022   ANIONGAP 5 12/05/2022    CBG (last 3)  No results for input(s): "GLUCAP" in the last 72 hours.    Coagulation Profile: No results for input(s): "INR", "PROTIME" in the last 168 hours.   Radiology Studies: No results found.     Kathlen Mody M.D. [facility] 12/06/2022, 6:57 PM  Available via Epic secure chat 7am-7pm After 7 pm, please refer to night coverage provider listed on amion.

## 2022-12-07 ENCOUNTER — Inpatient Hospital Stay (HOSPITAL_COMMUNITY): Payer: 59

## 2022-12-07 DIAGNOSIS — K5720 Diverticulitis of large intestine with perforation and abscess without bleeding: Secondary | ICD-10-CM | POA: Diagnosis not present

## 2022-12-07 LAB — CBC WITH DIFFERENTIAL/PLATELET
Abs Immature Granulocytes: 0.03 10*3/uL (ref 0.00–0.07)
Basophils Absolute: 0.1 10*3/uL (ref 0.0–0.1)
Basophils Relative: 1 %
Eosinophils Absolute: 0.2 10*3/uL (ref 0.0–0.5)
Eosinophils Relative: 2 %
HCT: 42.3 % (ref 39.0–52.0)
Hemoglobin: 13.3 g/dL (ref 13.0–17.0)
Immature Granulocytes: 0 %
Lymphocytes Relative: 19 %
Lymphs Abs: 1.7 10*3/uL (ref 0.7–4.0)
MCH: 26.4 pg (ref 26.0–34.0)
MCHC: 31.4 g/dL (ref 30.0–36.0)
MCV: 83.9 fL (ref 80.0–100.0)
Monocytes Absolute: 0.4 10*3/uL (ref 0.1–1.0)
Monocytes Relative: 5 %
Neutro Abs: 6.5 10*3/uL (ref 1.7–7.7)
Neutrophils Relative %: 73 %
Platelets: 322 10*3/uL (ref 150–400)
RBC: 5.04 MIL/uL (ref 4.22–5.81)
RDW: 15.6 % — ABNORMAL HIGH (ref 11.5–15.5)
WBC: 8.9 10*3/uL (ref 4.0–10.5)
nRBC: 0 % (ref 0.0–0.2)

## 2022-12-07 MED ORDER — IOHEXOL 350 MG/ML SOLN
75.0000 mL | Freq: Once | INTRAVENOUS | Status: AC | PRN
  Administered 2022-12-07: 75 mL via INTRAVENOUS

## 2022-12-07 MED ORDER — IOHEXOL 9 MG/ML PO SOLN
500.0000 mL | ORAL | Status: AC
  Administered 2022-12-07 (×2): 500 mL via ORAL

## 2022-12-07 MED ORDER — IOHEXOL 9 MG/ML PO SOLN
ORAL | Status: AC
  Filled 2022-12-07: qty 1000

## 2022-12-07 NOTE — Progress Notes (Signed)
Patient ID: Marcus Andrews, male   DOB: [birth year], 61 y.o.   MRN: [id number] [facility] Surgery Progress Note     Subjective: CC-  Up in chair. Started on soft diet yesterday. Overall better since admission, but states that he did have some increased abdominal pain the last 24 hours compared to the previous. No n/v. Passing flatus, BM this morning.  Objective: Vital signs in last 24 hours: Temp:  [98.4 F (36.9 C)-98.7 F (37.1 C)] 98.7 F (37.1 C) (09/23 0818) Pulse Rate:  [61-63] 63 (09/23 0406) Resp:  [17-19] 18 (09/23 0818) BP: (129-138)/(89-95) 138/89 (09/23 0818) SpO2:  [97 %-99 %] 97 % (09/23 0406) Last BM Date : 12/06/22  Intake/Output from previous day: 09/22 0701 - 09/23 0700 In: 3521 [P.O.:1190; I.V.:2281; IV Piggyback:50] Out: 400 [Urine:400] Intake/Output this shift: No intake/output data recorded.  PE: Gen:  Alert, NAD, pleasant Abd: soft, nondistended, mild LLQ TTP without rebound or guarding  Lab Results:  Recent Labs    12/04/22 1020 12/05/22 0415  WBC 8.1 7.5  HGB 13.9 12.9*  HCT 45.3 40.4  PLT 319 274   BMET Recent Labs    12/04/22 1020 12/05/22 0415  NA 138 136  K 3.5 4.1  CL 104 106  CO2 25 25  GLUCOSE 103* 113*  BUN 9 6  CREATININE 1.10 0.96  CALCIUM 8.6* 8.6*   PT/INR No results for input(s): "LABPROT", "INR" in the last 72 hours. CMP     Component Value Date/Time   NA 136 12/05/2022 0415   K 4.1 12/05/2022 0415   CL 106 12/05/2022 0415   CO2 25 12/05/2022 0415   GLUCOSE 113 (H) 12/05/2022 0415   BUN 6 12/05/2022 0415   CREATININE 0.96 12/05/2022 0415   CALCIUM 8.6 (L) 12/05/2022 0415   PROT 5.8 (L) 12/02/2022 0306   ALBUMIN 2.9 (L) 12/02/2022 0306   AST 11 (L) 12/02/2022 0306   ALT 12 12/02/2022 0306   ALKPHOS 68 12/02/2022 0306   BILITOT 1.0 12/02/2022 0306   GFRNONAA >60 12/05/2022 0415   GFRAA >60 06/25/2019 1247   Lipase     Component Value Date/Time   LIPASE 20 12/01/2022 1316        Studies/Results: No results found.  Anti-infectives: Anti-infectives (From admission, onward)    Start     Dose/Rate Route Frequency Ordered Stop   12/02/22 1100  metroNIDAZOLE (FLAGYL) IVPB 500 mg  Status:  Discontinued       Placed in "And" Linked Group   500 mg 100 mL/hr over 60 Minutes Intravenous Every 12 hours 12/01/22 2311 12/01/22 2343   12/02/22 0600  piperacillin-tazobactam (ZOSYN) IVPB 3.375 g        3.375 g 12.5 mL/hr over 240 Minutes Intravenous Every 8 hours 12/01/22 2343     12/01/22 2315  ceFEPIme (MAXIPIME) 2 g in sodium chloride 0.9 % 100 mL IVPB  Status:  Discontinued       Placed in "And" Linked Group   2 g 200 mL/hr over 30 Minutes Intravenous  Once 12/01/22 2311 12/01/22 2343   12/01/22 2215  ceFEPIme (MAXIPIME) 2 g in sodium chloride 0.9 % 100 mL IVPB       Placed in "And" Linked Group   2 g 200 mL/hr over 30 Minutes Intravenous  Once 12/01/22 2205 12/02/22 0000   12/01/22 2215  metroNIDAZOLE (FLAGYL) IVPB 500 mg  Status:  Discontinued       Placed in "And" Linked Group   500 mg  100 mL/hr over 60 Minutes Intravenous  Once 12/01/22 2205 12/01/22 2343        Assessment/Plan Perforated diverticulitis, Jehovah's witness - Some increased abdominal pain over the last 24 hours. Will make NPO and obtain CT scan today. Continue IV zosyn.   FEN - NPO VTE - SCDs, lovenox ID - zosyn   I reviewed last 24 h vitals and pain scores and last 48 h intake and output.    LOS: 6 days    Franne Forts, [facility] Surgery 12/07/2022, 8:51 AM Please see Amion for pager number during day hours 7:00am-4:30pm

## 2022-12-07 NOTE — Plan of Care (Signed)
Patient is continuing to do fine and has  been waiting for CT results pending DC. Will continue to monitor patient

## 2022-12-07 NOTE — Progress Notes (Signed)
Pharmacy Antibiotic Note  Marcus Andrews is a 62 y.o. male admitted on 12/01/2022 with abdominal pain in left/right lower quadrants since Sunday. Patient reports history of diverticulitis. Imaging shows acute perforated sigmoid diverticulitis, no fluid collection or abscess noted.  Pharmacy has been consulted for zosyn dosing.  D#6 Zosyn  Plan: Zosyn 3.375gm IV every 8 hours Monitor renal function, clinical progress, and transition to PO antibiotics   Height: 6' (182.9 cm) Weight: 82.5 kg (181 lb 14.1 oz) IBW/kg (Calculated) : 77.6  Temp (24hrs), Avg:98.5 F (36.9 C), Min:98.4 F (36.9 C), Max:98.7 F (37.1 C)  Recent Labs  Lab 12/01/22 1316 12/01/22 2249 12/02/22 0306 12/03/22 1228 12/04/22 1020 12/05/22 0415  WBC 11.4*  --  10.0 9.9 8.1 7.5  CREATININE 0.74 0.89 0.93 0.97 1.10 0.96    Estimated Creatinine Clearance: 89.8 mL/min (by C-G formula based on SCr of 0.96 mg/dL).    No Known Allergies  Antimicrobials this admission: Cefepime/flagyl x1   Zosyn 9/18 >>   Thank you for allowing pharmacy to be a part of this patient's care.  Trixie Rude, PharmD Clinical Pharmacist 12/07/2022  11:50 AM

## 2022-12-07 NOTE — Progress Notes (Signed)
[facility]                                                                               Marcus Andrews, is a 62 y.o. male, DOB - 05/22/1960, VVO:[id number] Admit date - 12/01/2022    Outpatient Primary MD for the patient is Mort Sawyers, FNP  LOS - 6  days    Brief summary   62 y.o. male with medical history significant of diverticulitis, Presented with   abd pain, was found to have microperforation. General surgery consulted for recommendations.    Assessment & Plan    Assessment and Plan:   Acute diverticulitis with pneumoperitoneum without any abscess Patient started on IV antibiotics and general surgery consulted.   Pain control . Patient continues to have mild to mod LLQ pain with soft diet. Plan for CT later today.  NPO.  General surgery on board. Continue with IV fluids and IV antibiotics.  He remains afebrile and wbc count wnl.   Conservative management.     Estimated body mass index is 24.67 kg/m as calculated from the following:   Height as of this encounter: 6' (1.829 m).   Weight as of this encounter: 82.5 kg.  Code Status: full code.  DVT Prophylaxis:  enoxaparin (LOVENOX) injection 40 mg Start: 12/05/22 1400 SCDs Start: 12/02/22 0000   Level of Care: Level of care: Progressive Family Communication: none at bedside   Disposition Plan:     Remains inpatient appropriate:  IV antibiotics.    Procedures:  None.   Consultants:   General surgery.   Antimicrobials:   Anti-infectives (From admission, onward)    Start     Dose/Rate Route Frequency Ordered Stop   12/02/22 1100  metroNIDAZOLE (FLAGYL) IVPB 500 mg  Status:  Discontinued       Placed in "And" Linked Group   500 mg 100 mL/hr over 60 Minutes Intravenous Every 12 hours 12/01/22 2311 12/01/22 2343   12/02/22 0600  piperacillin-tazobactam (ZOSYN) IVPB 3.375 g        3.375 g 12.5 mL/hr over 240 Minutes Intravenous Every 8 hours 12/01/22 2343     12/01/22 2315  ceFEPIme  (MAXIPIME) 2 g in sodium chloride 0.9 % 100 mL IVPB  Status:  Discontinued       Placed in "And" Linked Group   2 g 200 mL/hr over 30 Minutes Intravenous  Once 12/01/22 2311 12/01/22 2343   12/01/22 2215  ceFEPIme (MAXIPIME) 2 g in sodium chloride 0.9 % 100 mL IVPB       Placed in "And" Linked Group   2 g 200 mL/hr over 30 Minutes Intravenous  Once 12/01/22 2205 12/02/22 0000   12/01/22 2215  metroNIDAZOLE (FLAGYL) IVPB 500 mg  Status:  Discontinued       Placed in "And" Linked Group   500 mg 100 mL/hr over 60 Minutes Intravenous  Once 12/01/22 2205 12/01/22 2343        Medications  Scheduled Meds:  enoxaparin (LOVENOX) injection  40 mg Subcutaneous Q24H   Continuous Infusions:  lactated ringers 100 mL/hr at 12/07/22 1030   piperacillin-tazobactam (ZOSYN)  IV 3.375 g (12/07/22 0654)   PRN Meds:.acetaminophen **  OR** acetaminophen, HYDROmorphone (DILAUDID) injection, ondansetron **OR** ondansetron (ZOFRAN) IV, mouth rinse, phenylephrine-shark liver oil-mineral oil-petrolatum, witch hazel-glycerin    Subjective:   Marcus Andrews was seen and examined today.   No nausea, vomiting, BM earlier today.   Objective:   Vitals:   12/06/22 2039 12/07/22 0406 12/07/22 0818 12/07/22 1140  BP: (!) 137/95 129/89 138/89 (!) 140/98  Pulse: 61 63  (!) 53  Resp: 18 17 18 17   Temp: 98.4 F (36.9 C) 98.5 F (36.9 C) 98.7 F (37.1 C) 98.4 F (36.9 C)  TempSrc: Oral Oral Oral Oral  SpO2: 99% 97%  98%  Weight:      Height:        Intake/Output Summary (Last 24 hours) at 12/07/2022 1317 Last data filed at 12/07/2022 0654 Gross per 24 hour  Intake 3520.98 ml  Output 400 ml  Net 3120.98 ml   Filed Weights   12/01/22 2052 12/02/22 1744  Weight: 82.6 kg 82.5 kg     Exam General exam: Appears calm and comfortable  Respiratory system: Clear to auscultation. Respiratory effort normal. Cardiovascular system: S1 & S2 heard, RRR. No JVD,  Gastrointestinal system: Abdomen is soft, mild  tenderness in the LLQ.  Central nervous system: Alert and oriented. No focal neurological deficits. Extremities: Symmetric 5 x 5 power. Skin: No rashes, lesions or ulcers Psychiatry: Mood & affect appropriate.        Data Reviewed:  I have personally reviewed following labs and imaging studies   CBC Lab Results  Component Value Date   WBC 7.5 12/05/2022   RBC 4.88 12/05/2022   HGB 12.9 (L) 12/05/2022   HCT 40.4 12/05/2022   MCV 82.8 12/05/2022   MCH 26.4 12/05/2022   PLT 274 12/05/2022   MCHC 31.9 12/05/2022   RDW 15.6 (H) 12/05/2022   LYMPHSABS 1.4 12/05/2022   MONOABS 0.6 12/05/2022   EOSABS 0.4 12/05/2022   BASOSABS 0.0 12/05/2022     Last metabolic panel Lab Results  Component Value Date   NA 136 12/05/2022   K 4.1 12/05/2022   CL 106 12/05/2022   CO2 25 12/05/2022   BUN 6 12/05/2022   CREATININE 0.96 12/05/2022   GLUCOSE 113 (H) 12/05/2022   GFRNONAA >60 12/05/2022   GFRAA >60 06/25/2019   CALCIUM 8.6 (L) 12/05/2022   PHOS 3.7 12/02/2022   PROT 5.8 (L) 12/02/2022   ALBUMIN 2.9 (L) 12/02/2022   BILITOT 1.0 12/02/2022   ALKPHOS 68 12/02/2022   AST 11 (L) 12/02/2022   ALT 12 12/02/2022   ANIONGAP 5 12/05/2022    CBG (last 3)  No results for input(s): "GLUCAP" in the last 72 hours.    Coagulation Profile: No results for input(s): "INR", "PROTIME" in the last 168 hours.   Radiology Studies: No results found.     Kathlen Mody M.D. [facility] 12/07/2022, 1:17 PM  Available via Epic secure chat 7am-7pm After 7 pm, please refer to night coverage provider listed on amion.

## 2022-12-08 DIAGNOSIS — K5720 Diverticulitis of large intestine with perforation and abscess without bleeding: Secondary | ICD-10-CM | POA: Diagnosis not present

## 2022-12-08 LAB — BASIC METABOLIC PANEL
Anion gap: 10 (ref 5–15)
BUN: 5 mg/dL — ABNORMAL LOW (ref 6–20)
CO2: 26 mmol/L (ref 22–32)
Calcium: 8.9 mg/dL (ref 8.9–10.3)
Chloride: 105 mmol/L (ref 98–111)
Creatinine, Ser: 1.1 mg/dL (ref 0.61–1.24)
GFR, Estimated: >60 mL/min (ref >60)
Glucose, Bld: 100 mg/dL — ABNORMAL HIGH (ref 70–99)
Potassium: 3.6 mmol/L (ref 3.5–5.1)
Sodium: 141 mmol/L (ref 135–145)

## 2022-12-08 MED ORDER — PIPERACILLIN-TAZOBACTAM 3.375 G IVPB
3.3750 g | Freq: Three times a day (TID) | INTRAVENOUS | Status: DC
  Administered 2022-12-09: 3.375 g via INTRAVENOUS
  Filled 2022-12-08 (×3): qty 50

## 2022-12-08 MED ORDER — HYDROMORPHONE HCL 1 MG/ML IJ SOLN
0.5000 mg | INTRAMUSCULAR | Status: DC | PRN
  Administered 2022-12-08: 1 mg via INTRAVENOUS
  Filled 2022-12-08: qty 1

## 2022-12-08 MED ORDER — OXYCODONE HCL 5 MG PO TABS
5.0000 mg | ORAL_TABLET | ORAL | Status: DC | PRN
  Administered 2022-12-08: 5 mg via ORAL
  Filled 2022-12-08: qty 1

## 2022-12-08 NOTE — Progress Notes (Signed)
Patient ID: Marcus Andrews, male   DOB: Mar 29, 1960, 62 y.o.   MRN: [id number] [facility] Surgery Progress Note     Subjective: CC-  Continues to have some mild LLQ abdominal pain. Overall improved since admission. No n/v. About to order breakfast.  Objective: Vital signs in last 24 hours: Temp:  [98.4 F (36.9 C)-98.7 F (37.1 C)] 98.5 F (36.9 C) (09/24 0303) Pulse Rate:  [53-100] 100 (09/24 0303) Resp:  [17-18] 18 (09/23 2024) BP: (119-140)/(84-98) 119/84 (09/24 0303) SpO2:  [82 %-98 %] 82 % (09/24 0303) Last BM Date : 12/07/22  Intake/Output from previous day: 09/23 0701 - 09/24 0700 In: 902.8 [I.V.:780.5; IV Piggyback:122.3] Out: 350 [Urine:350] Intake/Output this shift: Total I/O In: -  Out: 400 [Urine:400]  PE: Gen:  Alert, NAD, pleasant Abd: soft, nondistended, mild LLQ TTP without rebound or guarding  Lab Results:  Recent Labs    12/07/22 1351  WBC 8.9  HGB 13.3  HCT 42.3  PLT 322   BMET Recent Labs    12/08/22 0259  NA 141  K 3.6  CL 105  CO2 26  GLUCOSE 100*  BUN 5*  CREATININE 1.10  CALCIUM 8.9   PT/INR No results for input(s): "LABPROT", "INR" in the last 72 hours. CMP     Component Value Date/Time   NA 141 12/08/2022 0259   K 3.6 12/08/2022 0259   CL 105 12/08/2022 0259   CO2 26 12/08/2022 0259   GLUCOSE 100 (H) 12/08/2022 0259   BUN 5 (L) 12/08/2022 0259   CREATININE 1.10 12/08/2022 0259   CALCIUM 8.9 12/08/2022 0259   PROT 5.8 (L) 12/02/2022 0306   ALBUMIN 2.9 (L) 12/02/2022 0306   AST 11 (L) 12/02/2022 0306   ALT 12 12/02/2022 0306   ALKPHOS 68 12/02/2022 0306   BILITOT 1.0 12/02/2022 0306   GFRNONAA >60 12/08/2022 0259   GFRAA >60 06/25/2019 1247   Lipase     Component Value Date/Time   LIPASE 20 12/01/2022 1316       Studies/Results: CT ABDOMEN PELVIS W CONTRAST  Result Date: 12/07/2022 CLINICAL DATA:  Diverticulitis.  Complication suspected. EXAM: CT ABDOMEN AND PELVIS WITH CONTRAST TECHNIQUE:  Multidetector CT imaging of the abdomen and pelvis was performed using the standard protocol following bolus administration of intravenous contrast. RADIATION DOSE REDUCTION: This exam was performed according to the departmental dose-optimization program which includes automated exposure control, adjustment of the mA and/or kV according to patient size and/or use of iterative reconstruction technique. CONTRAST:  75mL OMNIPAQUE IOHEXOL 350 MG/ML SOLN COMPARISON:  12/01/2022 FINDINGS: Lower chest: Atelectasis or linear scarring noted in the lung bases. Hepatobiliary: Scattered tiny hypodensities in the liver parenchyma are too small to characterize but are statistically most likely benign. No followup imaging is recommended. There is no evidence for gallstones, gallbladder wall thickening, or pericholecystic fluid. No intrahepatic or extrahepatic biliary dilation. Pancreas: No focal mass lesion. No dilatation of the main duct. No intraparenchymal cyst. No peripancreatic edema. Spleen: No splenomegaly. No suspicious focal mass lesion. Adrenals/Urinary Tract: No adrenal nodule or mass. Kidneys unremarkable. No evidence for hydroureter. The urinary bladder appears normal for the degree of distention. Stomach/Bowel: Stomach is unremarkable. No gastric wall thickening. No evidence of outlet obstruction. Duodenum is normally positioned as is the ligament of Treitz. No small bowel wall thickening. No small bowel dilatation. The terminal ileum is normal. The appendix is normal. Diverticular disease again noted left colon with marked wall thickening in the sigmoid segment. Pericolonic edema/inflammation along  the sigmoid colon is similar to prior. Scattered small foci of extraluminal gas trapped in the sigmoid mesocolon is similar to prior including dominant component measuring 3.1 cm on image 55/3. No rim enhancing or organized fluid collection to suggest drainable abscess at this time. Free gas seen previously up under the  anterior abdominal wall and anterior to the liver has resolved. No free gas up under the anterior abdominal wall. Vascular/Lymphatic: No substantial atherosclerotic calcification in the abdominal aorta. No abdominal aortic aneurysm. There is no gastrohepatic or hepatoduodenal ligament lymphadenopathy. No retroperitoneal or mesenteric lymphadenopathy. No pelvic sidewall lymphadenopathy. Reproductive: The prostate gland and seminal vesicles are unremarkable. Other: No intraperitoneal free fluid. Musculoskeletal: No worrisome lytic or sclerotic osseous abnormality. IMPRESSION: 1. No substantial interval change in the appearance of the abdomen or pelvis. 2. Persistent findings of acute sigmoid diverticulitis with scattered small foci of extraluminal gas trapped in the sigmoid mesocolon. Free intraperitoneal gas seen in the upper abdomen on the previous exam has resolved. No rim enhancing or organized fluid collection to suggest drainable abscess at this time. Electronically Signed   By: Kennith Center M.D.   On: 12/07/2022 16:47    Anti-infectives: Anti-infectives (From admission, onward)    Start     Dose/Rate Route Frequency Ordered Stop   12/02/22 1100  metroNIDAZOLE (FLAGYL) IVPB 500 mg  Status:  Discontinued       Placed in "And" Linked Group   500 mg 100 mL/hr over 60 Minutes Intravenous Every 12 hours 12/01/22 2311 12/01/22 2343   12/02/22 0600  piperacillin-tazobactam (ZOSYN) IVPB 3.375 g        3.375 g 12.5 mL/hr over 240 Minutes Intravenous Every 8 hours 12/01/22 2343     12/01/22 2315  ceFEPIme (MAXIPIME) 2 g in sodium chloride 0.9 % 100 mL IVPB  Status:  Discontinued       Placed in "And" Linked Group   2 g 200 mL/hr over 30 Minutes Intravenous  Once 12/01/22 2311 12/01/22 2343   12/01/22 2215  ceFEPIme (MAXIPIME) 2 g in sodium chloride 0.9 % 100 mL IVPB       Placed in "And" Linked Group   2 g 200 mL/hr over 30 Minutes Intravenous  Once 12/01/22 2205 12/02/22 0000   12/01/22 2215   metroNIDAZOLE (FLAGYL) IVPB 500 mg  Status:  Discontinued       Placed in "And" Linked Group   500 mg 100 mL/hr over 60 Minutes Intravenous  Once 12/01/22 2205 12/01/22 2343        Assessment/Plan Perforated diverticulitis, Jehovah's witness - Repeat CT 9/23 stable/improved, no drainable abscess - Continue IV zosyn. Advance to soft diet. If tolerating this and pain is stable/improved he would be ok for discharge later today with a course of oral antibiotics. He plans to follow up with his gastroenterologist, Dr. Russella Dar, after discharge who will arrange for colonoscopy in about 6 weeks.   FEN - soft VTE - SCDs, lovenox ID - zosyn  I reviewed last 24 h vitals and pain scores, last 48 h intake and output, last 24 h labs and trends, and last 24 h imaging results.    LOS: 7 days    Franne Forts, [facility] Surgery 12/08/2022, 8:06 AM Please see Amion for pager number during day hours 7:00am-4:30pm

## 2022-12-08 NOTE — Plan of Care (Signed)
No acute changes

## 2022-12-08 NOTE — Progress Notes (Signed)
[facility]                                                                               Marcus Andrews, is a 62 y.o. male, DOB - [birth year], KVQ:[id number] Admit date - 12/01/2022    Outpatient Primary MD for the patient is Mort Sawyers, FNP  LOS - 7  days    Brief summary   61 y.o. male with medical history significant of diverticulitis, Presented with   abd pain, was found to have microperforation. General surgery consulted for recommendations.    Assessment & Plan    Assessment and Plan:   Acute diverticulitis with pneumoperitoneum without any abscess Patient started on IV antibiotics and general surgery consulted.   Patient continues to have mild to mod LLQ pain with soft diet.  Underwent repeat CT abd and pelvis with contrast showing stable changes of diverticulitis.  General surgery on board. Recommend to continue with IV zosyn and advance diet to soft diet.  Recommend outpatient follow up with gastroenterologist after discharge for colonoscopy in 6 weeks.  He remains afebrile and wbc count wnl.   Conservative management.     Estimated body mass index is 24.67 kg/m as calculated from the following:   Height as of this encounter: 6' (1.829 m).   Weight as of this encounter: 82.5 kg.  Code Status: full code.  DVT Prophylaxis:  enoxaparin (LOVENOX) injection 40 mg Start: 12/05/22 1400 SCDs Start: 12/02/22 0000   Level of Care: Level of care: Progressive Family Communication: none at bedside   Disposition Plan:     Remains inpatient appropriate:  IV antibiotics.    Procedures:  None.   Consultants:   General surgery.   Antimicrobials:   Anti-infectives (From admission, onward)    Start     Dose/Rate Route Frequency Ordered Stop   12/02/22 1100  metroNIDAZOLE (FLAGYL) IVPB 500 mg  Status:  Discontinued       Placed in "And" Linked Group   500 mg 100 mL/hr over 60 Minutes Intravenous Every 12 hours 12/01/22 2311 12/01/22 2343    12/02/22 0600  piperacillin-tazobactam (ZOSYN) IVPB 3.375 g        3.375 g 12.5 mL/hr over 240 Minutes Intravenous Every 8 hours 12/01/22 2343     12/01/22 2315  ceFEPIme (MAXIPIME) 2 g in sodium chloride 0.9 % 100 mL IVPB  Status:  Discontinued       Placed in "And" Linked Group   2 g 200 mL/hr over 30 Minutes Intravenous  Once 12/01/22 2311 12/01/22 2343   12/01/22 2215  ceFEPIme (MAXIPIME) 2 g in sodium chloride 0.9 % 100 mL IVPB       Placed in "And" Linked Group   2 g 200 mL/hr over 30 Minutes Intravenous  Once 12/01/22 2205 12/02/22 0000   12/01/22 2215  metroNIDAZOLE (FLAGYL) IVPB 500 mg  Status:  Discontinued       Placed in "And" Linked Group   500 mg 100 mL/hr over 60 Minutes Intravenous  Once 12/01/22 2205 12/01/22 2343        Medications  Scheduled Meds:  enoxaparin (LOVENOX) injection  40 mg Subcutaneous Q24H   Continuous  Infusions:  lactated ringers 100 mL/hr at 12/07/22 2342   piperacillin-tazobactam (ZOSYN)  IV 3.375 g (12/08/22 0612)   PRN Meds:.acetaminophen **OR** acetaminophen, HYDROmorphone (DILAUDID) injection, ondansetron **OR** ondansetron (ZOFRAN) IV, mouth rinse, oxyCODONE, phenylephrine-shark liver oil-mineral oil-petrolatum, witch hazel-glycerin    Subjective:   Marcus Andrews was seen and examined today.   Pain is improving. No nausea, or vomiting.   Objective:   Vitals:   12/07/22 0818 12/07/22 1140 12/07/22 2024 12/08/22 0303  BP: 138/89 (!) 140/98 135/86 119/84  Pulse:  (!) 53 63 100  Resp: 18 17 18    Temp: 98.7 F (37.1 C) 98.4 F (36.9 C) 98.6 F (37 C) 98.5 F (36.9 C)  TempSrc: Oral Oral  Oral  SpO2:  98% 98% (!) 82%  Weight:      Height:        Intake/Output Summary (Last 24 hours) at 12/08/2022 1107 Last data filed at 12/08/2022 0725 Gross per 24 hour  Intake 902.8 ml  Output 750 ml  Net 152.8 ml   Filed Weights   12/01/22 2052 12/02/22 1744  Weight: 82.6 kg 82.5 kg     Exam General exam: Appears calm and  comfortable  Respiratory system: Clear to auscultation. Respiratory effort normal. Cardiovascular system: S1 & S2 heard, RRR. No JVD, murmurs, rubs, gallops or clicks. No pedal edema. Gastrointestinal system: Abdomen is soft, mild tenderness in the LLQ. Central nervous system: Alert and oriented. No focal neurological deficits. Extremities: Symmetric 5 x 5 power. Skin: No rashes, lesions or ulcers Psychiatry: Judgement and insight appear normal. Mood & affect appropriate.         Data Reviewed:  I have personally reviewed following labs and imaging studies   CBC Lab Results  Component Value Date   WBC 8.9 12/07/2022   RBC 5.04 12/07/2022   HGB 13.3 12/07/2022   HCT 42.3 12/07/2022   MCV 83.9 12/07/2022   MCH 26.4 12/07/2022   PLT 322 12/07/2022   MCHC 31.4 12/07/2022   RDW 15.6 (H) 12/07/2022   LYMPHSABS 1.7 12/07/2022   MONOABS 0.4 12/07/2022   EOSABS 0.2 12/07/2022   BASOSABS 0.1 12/07/2022     Last metabolic panel Lab Results  Component Value Date   NA 141 12/08/2022   K 3.6 12/08/2022   CL 105 12/08/2022   CO2 26 12/08/2022   BUN 5 (L) 12/08/2022   CREATININE 1.10 12/08/2022   GLUCOSE 100 (H) 12/08/2022   GFRNONAA >60 12/08/2022   GFRAA >60 06/25/2019   CALCIUM 8.9 12/08/2022   PHOS 3.7 12/02/2022   PROT 5.8 (L) 12/02/2022   ALBUMIN 2.9 (L) 12/02/2022   BILITOT 1.0 12/02/2022   ALKPHOS 68 12/02/2022   AST 11 (L) 12/02/2022   ALT 12 12/02/2022   ANIONGAP 10 12/08/2022    CBG (last 3)  No results for input(s): "GLUCAP" in the last 72 hours.    Coagulation Profile: No results for input(s): "INR", "PROTIME" in the last 168 hours.   Radiology Studies: CT ABDOMEN PELVIS W CONTRAST  Result Date: 12/07/2022 CLINICAL DATA:  Diverticulitis.  Complication suspected. EXAM: CT ABDOMEN AND PELVIS WITH CONTRAST TECHNIQUE: Multidetector CT imaging of the abdomen and pelvis was performed using the standard protocol following bolus administration of  intravenous contrast. RADIATION DOSE REDUCTION: This exam was performed according to the departmental dose-optimization program which includes automated exposure control, adjustment of the mA and/or kV according to patient size and/or use of iterative reconstruction technique. CONTRAST:  75mL OMNIPAQUE IOHEXOL 350 MG/ML SOLN COMPARISON:  12/01/2022 FINDINGS: Lower chest: Atelectasis or linear scarring noted in the lung bases. Hepatobiliary: Scattered tiny hypodensities in the liver parenchyma are too small to characterize but are statistically most likely benign. No followup imaging is recommended. There is no evidence for gallstones, gallbladder wall thickening, or pericholecystic fluid. No intrahepatic or extrahepatic biliary dilation. Pancreas: No focal mass lesion. No dilatation of the main duct. No intraparenchymal cyst. No peripancreatic edema. Spleen: No splenomegaly. No suspicious focal mass lesion. Adrenals/Urinary Tract: No adrenal nodule or mass. Kidneys unremarkable. No evidence for hydroureter. The urinary bladder appears normal for the degree of distention. Stomach/Bowel: Stomach is unremarkable. No gastric wall thickening. No evidence of outlet obstruction. Duodenum is normally positioned as is the ligament of Treitz. No small bowel wall thickening. No small bowel dilatation. The terminal ileum is normal. The appendix is normal. Diverticular disease again noted left colon with marked wall thickening in the sigmoid segment. Pericolonic edema/inflammation along the sigmoid colon is similar to prior. Scattered small foci of extraluminal gas trapped in the sigmoid mesocolon is similar to prior including dominant component measuring 3.1 cm on image 55/3. No rim enhancing or organized fluid collection to suggest drainable abscess at this time. Free gas seen previously up under the anterior abdominal wall and anterior to the liver has resolved. No free gas up under the anterior abdominal wall.  Vascular/Lymphatic: No substantial atherosclerotic calcification in the abdominal aorta. No abdominal aortic aneurysm. There is no gastrohepatic or hepatoduodenal ligament lymphadenopathy. No retroperitoneal or mesenteric lymphadenopathy. No pelvic sidewall lymphadenopathy. Reproductive: The prostate gland and seminal vesicles are unremarkable. Other: No intraperitoneal free fluid. Musculoskeletal: No worrisome lytic or sclerotic osseous abnormality. IMPRESSION: 1. No substantial interval change in the appearance of the abdomen or pelvis. 2. Persistent findings of acute sigmoid diverticulitis with scattered small foci of extraluminal gas trapped in the sigmoid mesocolon. Free intraperitoneal gas seen in the upper abdomen on the previous exam has resolved. No rim enhancing or organized fluid collection to suggest drainable abscess at this time. Electronically Signed   By: Kennith Center M.D.   On: 12/07/2022 16:47       Kathlen Mody M.D. [facility] 12/08/2022, 11:07 AM  Available via Epic secure chat 7am-7pm After 7 pm, please refer to night coverage provider listed on amion.

## 2022-12-09 ENCOUNTER — Encounter: Payer: Self-pay | Admitting: Gastroenterology

## 2022-12-09 ENCOUNTER — Other Ambulatory Visit (HOSPITAL_COMMUNITY): Payer: Self-pay

## 2022-12-09 DIAGNOSIS — K5720 Diverticulitis of large intestine with perforation and abscess without bleeding: Secondary | ICD-10-CM | POA: Diagnosis not present

## 2022-12-09 MED ORDER — OXYCODONE HCL 5 MG PO TABS: 5.0000 mg | ORAL_TABLET | ORAL | Status: DC | PRN

## 2022-12-09 MED ORDER — ACETAMINOPHEN 325 MG PO TABS: 650.0000 mg | ORAL_TABLET | Freq: Four times a day (QID) | ORAL | Status: DC | PRN

## 2022-12-09 MED ORDER — AMOXICILLIN-POT CLAVULANATE 875-125 MG PO TABS
1.0000 | ORAL_TABLET | Freq: Two times a day (BID) | ORAL | Status: DC
  Administered 2022-12-09: 1 via ORAL
  Filled 2022-12-09 (×2): qty 1

## 2022-12-09 MED ORDER — AMOXICILLIN-POT CLAVULANATE 875-125 MG PO TABS
1.0000 | ORAL_TABLET | Freq: Two times a day (BID) | ORAL | 0 refills | Status: DC
  Filled 2022-12-09: qty 14, 7d supply, fill #0

## 2022-12-09 MED ORDER — ACETAMINOPHEN 325 MG PO TABS: 650.0000 mg | ORAL_TABLET | Freq: Four times a day (QID) | ORAL | Status: AC | PRN

## 2022-12-09 NOTE — Discharge Summary (Signed)
DISCHARGE SUMMARY  Marcus Andrews  MR#: [id number]  DOB:1961/01/08  Date of Admission: 12/01/2022 Date of Discharge: 12/09/2022  Attending Physician:Ezma Rehm Silvestre Gunner, MD  Patient's VHQ:IONGE, Marcus Mage, FNP  Consults: General Surgery  Disposition: Discharge home  Follow-up Appts:  Follow-up Information     [facility], [place]. Call.   Specialty: General Surgery Why: As needed after you see gastroenterology and have a repeat colonoscopy, to discuss elective colon resection Contact information: [address] [place] [address] [phone]        Meryl Dare, MD. Schedule an appointment as soon as possible for a visit in 2 week(s).   Specialty: Gastroenterology Contact information: [address] [place] [address] [phone]                 Tests Needing Follow-up: -follow up colonoscopy in 6 weeks was suggested by General Surgery   Discharge Diagnoses: Acute diverticulitis with pneumoperitoneum/microperforation without abscess  Initial presentation: 62 year old male with a history significant only for diverticulosis with prior episodes of diverticulitis who presented to the ER 9/17 with a 2-day history of abdominal pain in the left lower quadrant.  CT abdomen/pelvis revealed diverticulitis with a moderate amount of pneumoperitoneum with no evidence of an abscess.  The patient was transferred to [facility] for admission and general surgery was consulted.  Hospital Course:  Acute diverticulitis with pneumoperitoneum/microperforation without abscess Successfully treated with conservative therapy to include IV antibiotics and bowel rest -repeat CT abdomen pelvis with contrast confirm stability -diet slowly advanced under direction of general surgery -at time of discharge patient tolerating diet and moving bowels with no complications -recommendation is for patient to follow-up with his  gastroenterologist in the outpatient setting for colonoscopy in approximately 6 weeks -will complete 7 additional days of oral antibiotics after discharge  Allergies as of 12/09/2022   No Known Allergies      Medication List     TAKE these medications    acetaminophen 325 MG tablet Commonly known as: TYLENOL Take 2 tablets (650 mg total) by mouth every 6 (six) hours as needed for mild pain, fever or headache.   albuterol 108 (90 Base) MCG/ACT inhaler Commonly known as: VENTOLIN HFA TAKE 2 PUFFS BY MOUTH EVERY 6 HOURS AS NEEDED FOR WHEEZE OR SHORTNESS OF BREATH   amoxicillin-clavulanate 875-125 MG tablet Commonly known as: AUGMENTIN Take 1 tablet by mouth every 12 (twelve) hours.   atorvastatin 10 MG tablet Commonly known as: LIPITOR Take 1 tablet (10 mg total) by mouth daily.   glycopyrrolate 1 MG tablet Commonly known as: Robinul Take 1 tablet (1 mg total) by mouth 2 (two) times daily.   ibuprofen 600 MG tablet Commonly known as: ADVIL Take 1 tablet (600 mg total) by mouth every 8 (eight) hours as needed.   omeprazole 40 MG capsule Commonly known as: PRILOSEC Take 40 mg by mouth daily.   traMADol 50 MG tablet Commonly known as: ULTRAM Take 50 mg by mouth daily as needed (for knee pain).        Day of Discharge BP (!) 129/91 (BP Location: Right Arm)   Pulse 66   Temp 99.2 F (37.3 C) (Oral)   Resp 17   Ht 6' (1.829 m)   Wt 82.5 kg   SpO2 96%   BMI 24.67 kg/m   Physical Exam: General: No acute respiratory distress Lungs: Clear to auscultation bilaterally without wheezes or crackles Cardiovascular: Regular rate and rhythm without murmur gallop  or rub normal S1 and S2 Abdomen: Nontender, nondistended, soft, bowel sounds positive, no rebound, no ascites, no appreciable mass Extremities: No significant cyanosis, clubbing, or edema bilateral lower extremities  Basic Metabolic Panel: Recent Labs  Lab 12/03/22 1228 12/04/22 1020 12/05/22 0415  12/08/22 0259  NA 138 138 136 141  K 3.5 3.5 4.1 3.6  CL 101 104 106 105  CO2 25 25 25 26   GLUCOSE 81 103* 113* 100*  BUN 15 9 6  5*  CREATININE 0.97 1.10 0.96 1.10  CALCIUM 8.8* 8.6* 8.6* 8.9    CBC: Recent Labs  Lab 12/03/22 1228 12/04/22 1020 12/05/22 0415 12/07/22 1351  WBC 9.9 8.1 7.5 8.9  NEUTROABS 7.7  --  5.0 6.5  HGB 14.6 13.9 12.9* 13.3  HCT 44.7 45.3 40.4 42.3  MCV 84.2 83.4 82.8 83.9  PLT 295 319 274 322    Time spent in discharge (includes decision making & examination of pt): 30 minutes  12/09/2022, 12:40 PM   Lonia Blood, MD [facility] Office  [phone]

## 2022-12-09 NOTE — Progress Notes (Signed)
Patient ID: Marcus Andrews, male   DOB: 06/15/1960, 62 y.o.   MRN: [id number] [facility] Progress Note     Subjective: CC-  Feeling better today. Mild suprapubic abdominal pain. No n/v. Tolerating diet. BM this morning.  Objective: Vital signs in last 24 hours: Temp:  [98.4 F (36.9 C)-99.2 F (37.3 C)] 99.2 F (37.3 C) (09/25 0757) Pulse Rate:  [66-78] 66 (09/25 0757) Resp:  [16-18] 17 (09/25 0757) BP: (123-129)/(82-91) 129/91 (09/25 0757) SpO2:  [96 %-99 %] 96 % (09/25 0757) Last BM Date : 12/07/22  Intake/Output from previous day: 09/24 0701 - 09/25 0700 In: 1158.5 [I.V.:977.9; IV Piggyback:180.6] Out: 400 [Urine:400] Intake/Output this shift: No intake/output data recorded.  PE: Gen:  Alert, NAD, pleasant Abd: soft, nondistended, minimal suprapubic TTP without rebound or guarding  Lab Results:  Recent Labs    12/07/22 1351  WBC 8.9  HGB 13.3  HCT 42.3  PLT 322   BMET Recent Labs    12/08/22 0259  NA 141  K 3.6  CL 105  CO2 26  GLUCOSE 100*  BUN 5*  CREATININE 1.10  CALCIUM 8.9   PT/INR No results for input(s): "LABPROT", "INR" in the last 72 hours. CMP     Component Value Date/Time   NA 141 12/08/2022 0259   K 3.6 12/08/2022 0259   CL 105 12/08/2022 0259   CO2 26 12/08/2022 0259   GLUCOSE 100 (H) 12/08/2022 0259   BUN 5 (L) 12/08/2022 0259   CREATININE 1.10 12/08/2022 0259   CALCIUM 8.9 12/08/2022 0259   PROT 5.8 (L) 12/02/2022 0306   ALBUMIN 2.9 (L) 12/02/2022 0306   AST 11 (L) 12/02/2022 0306   ALT 12 12/02/2022 0306   ALKPHOS 68 12/02/2022 0306   BILITOT 1.0 12/02/2022 0306   GFRNONAA >60 12/08/2022 0259   GFRAA >60 06/25/2019 1247   Lipase     Component Value Date/Time   LIPASE 20 12/01/2022 1316       Studies/Results: CT ABDOMEN PELVIS W CONTRAST  Result Date: 12/07/2022 CLINICAL DATA:  Diverticulitis.  Complication suspected. EXAM: CT ABDOMEN AND PELVIS WITH CONTRAST TECHNIQUE: Multidetector CT imaging of  the abdomen and pelvis was performed using the standard protocol following bolus administration of intravenous contrast. RADIATION DOSE REDUCTION: This exam was performed according to the departmental dose-optimization program which includes automated exposure control, adjustment of the mA and/or kV according to patient size and/or use of iterative reconstruction technique. CONTRAST:  75mL OMNIPAQUE IOHEXOL 350 MG/ML SOLN COMPARISON:  12/01/2022 FINDINGS: Lower chest: Atelectasis or linear scarring noted in the lung bases. Hepatobiliary: Scattered tiny hypodensities in the liver parenchyma are too small to characterize but are statistically most likely benign. No followup imaging is recommended. There is no evidence for gallstones, gallbladder wall thickening, or pericholecystic fluid. No intrahepatic or extrahepatic biliary dilation. Pancreas: No focal mass lesion. No dilatation of the main duct. No intraparenchymal cyst. No peripancreatic edema. Spleen: No splenomegaly. No suspicious focal mass lesion. Adrenals/Urinary Tract: No adrenal nodule or mass. Kidneys unremarkable. No evidence for hydroureter. The urinary bladder appears normal for the degree of distention. Stomach/Bowel: Stomach is unremarkable. No gastric wall thickening. No evidence of outlet obstruction. Duodenum is normally positioned as is the ligament of Treitz. No small bowel wall thickening. No small bowel dilatation. The terminal ileum is normal. The appendix is normal. Diverticular disease again noted left colon with marked wall thickening in the sigmoid segment. Pericolonic edema/inflammation along the sigmoid colon is similar to prior. Scattered  small foci of extraluminal gas trapped in the sigmoid mesocolon is similar to prior including dominant component measuring 3.1 cm on image 55/3. No rim enhancing or organized fluid collection to suggest drainable abscess at this time. Free gas seen previously up under the anterior abdominal wall and  anterior to the liver has resolved. No free gas up under the anterior abdominal wall. Vascular/Lymphatic: No substantial atherosclerotic calcification in the abdominal aorta. No abdominal aortic aneurysm. There is no gastrohepatic or hepatoduodenal ligament lymphadenopathy. No retroperitoneal or mesenteric lymphadenopathy. No pelvic sidewall lymphadenopathy. Reproductive: The prostate gland and seminal vesicles are unremarkable. Other: No intraperitoneal free fluid. Musculoskeletal: No worrisome lytic or sclerotic osseous abnormality. IMPRESSION: 1. No substantial interval change in the appearance of the abdomen or pelvis. 2. Persistent findings of acute sigmoid diverticulitis with scattered small foci of extraluminal gas trapped in the sigmoid mesocolon. Free intraperitoneal gas seen in the upper abdomen on the previous exam has resolved. No rim enhancing or organized fluid collection to suggest drainable abscess at this time. Electronically Signed   By: Kennith Center M.D.   On: 12/07/2022 16:47    Anti-infectives: Anti-infectives (From admission, onward)    Start     Dose/Rate Route Frequency Ordered Stop   12/09/22 0300  piperacillin-tazobactam (ZOSYN) IVPB 3.375 g        3.375 g 12.5 mL/hr over 240 Minutes Intravenous Every 8 hours 12/08/22 2058     12/02/22 1100  metroNIDAZOLE (FLAGYL) IVPB 500 mg  Status:  Discontinued       Placed in "And" Linked Group   500 mg 100 mL/hr over 60 Minutes Intravenous Every 12 hours 12/01/22 2311 12/01/22 2343   12/02/22 0600  piperacillin-tazobactam (ZOSYN) IVPB 3.375 g  Status:  Discontinued        3.375 g 12.5 mL/hr over 240 Minutes Intravenous Every 8 hours 12/01/22 2343 12/08/22 2058   12/01/22 2315  ceFEPIme (MAXIPIME) 2 g in sodium chloride 0.9 % 100 mL IVPB  Status:  Discontinued       Placed in "And" Linked Group   2 g 200 mL/hr over 30 Minutes Intravenous  Once 12/01/22 2311 12/01/22 2343   12/01/22 2215  ceFEPIme (MAXIPIME) 2 g in sodium chloride  0.9 % 100 mL IVPB       Placed in "And" Linked Group   2 g 200 mL/hr over 30 Minutes Intravenous  Once 12/01/22 2205 12/02/22 0000   12/01/22 2215  metroNIDAZOLE (FLAGYL) IVPB 500 mg  Status:  Discontinued       Placed in "And" Linked Group   500 mg 100 mL/hr over 60 Minutes Intravenous  Once 12/01/22 2205 12/01/22 2343        Assessment/Plan Perforated diverticulitis, Jehovah's witness - Repeat CT 9/23 stable/improved, no drainable abscess - Continues to clinically improve. Ok for discharge from surgical standpoint. Continue soft diet. Recommend 1 week of oral antibiotics. He plans to follow up with his gastroenterologist, Dr. Russella Dar, after discharge who will arrange for colonoscopy in about 6 weeks.   FEN - soft VTE - SCDs, lovenox ID - zosyn  I reviewed hospitalist notes, last 24 h vitals and pain scores, and last 48 h intake and output.    LOS: 8 days    Franne Forts, [facility] 12/09/2022, 10:24 AM Please see Amion for pager number during day hours 7:00am-4:30pm

## 2022-12-10 ENCOUNTER — Telehealth: Payer: Self-pay | Admitting: Gastroenterology

## 2022-12-10 NOTE — Telephone Encounter (Signed)
The pt was discharged from the hospital on 9/25 for perforation/diverticulitis.  He wanted to ensure that he should continue omeprazole and robinul.  I have advised that he should take as directed.  He has an appt for colon in 6 weeks with Dr Russella Dar and will keep that as planned. He also will call back if he has any further questions or concerns.

## 2022-12-10 NOTE — Telephone Encounter (Signed)
Inbound call from patient stating that he was discharged from the hospital yesterday and is requesting a call to discuss what medications he should and should not take. Please advise.

## 2022-12-29 ENCOUNTER — Ambulatory Visit (AMBULATORY_SURGERY_CENTER): Payer: 59

## 2022-12-29 VITALS — Ht 72.0 in | Wt 170.0 lb

## 2022-12-29 DIAGNOSIS — Z860100 Personal history of colon polyps, unspecified: Secondary | ICD-10-CM

## 2022-12-29 DIAGNOSIS — K5720 Diverticulitis of large intestine with perforation and abscess without bleeding: Secondary | ICD-10-CM

## 2022-12-29 MED ORDER — NA SULFATE-K SULFATE-MG SULF 17.5-3.13-1.6 GM/177ML PO SOLN
1.0000 | Freq: Once | ORAL | 0 refills | Status: AC
Start: 2022-12-29 — End: 2022-12-29

## 2022-12-29 NOTE — Progress Notes (Signed)
No egg or soy allergy known to patient  No issues known to pt with past sedation with any surgeries or procedures Patient denies ever being told they had issues or difficulty with intubation  No FH of Malignant Hyperthermia Pt is not on diet pills Pt is not on  home 02  Pt is not on blood thinners  Pt reports int constipation drinks prune juice as needed  No A fib or A flutter Have any cardiac testing pending--no  LOA: independent  Prep: suprep   Patient's chart reviewed by Cathlyn Parsons CNRA prior to previsit and patient appropriate for the [facility].  Previsit completed and red dot placed by patient's name on their procedure day (on provider's schedule).     PV competed with patient. Prep instructions sent via mychart and home address. Goodrx coupon for [facility] provided to use for price reduction if needed.

## 2022-12-30 ENCOUNTER — Encounter: Payer: Self-pay | Admitting: Gastroenterology

## 2022-12-31 ENCOUNTER — Inpatient Hospital Stay: Payer: 59 | Admitting: Family

## 2023-01-05 ENCOUNTER — Ambulatory Visit: Payer: 59 | Admitting: Family

## 2023-01-05 ENCOUNTER — Encounter: Payer: Self-pay | Admitting: Family

## 2023-01-05 VITALS — BP 116/84 | HR 61 | Temp 97.8°F | Ht 72.0 in | Wt 170.0 lb

## 2023-01-05 DIAGNOSIS — K5790 Diverticulosis of intestine, part unspecified, without perforation or abscess without bleeding: Secondary | ICD-10-CM

## 2023-01-05 DIAGNOSIS — E785 Hyperlipidemia, unspecified: Secondary | ICD-10-CM

## 2023-01-05 NOTE — Progress Notes (Signed)
Established Patient Hospital follow up Visit Subjective:   Patient ID: Marcus Andrews, male    DOB: [birth year]  Age: 62 y.o. MRN: [id number]  CC:  Chief Complaint  Patient presents with   Hospitalization Follow-up    Marcus Andrews 12/01/22 - 12/09/22 for acute diverticulitis         HPI  Marcus Andrews is a 61 y.o. male presenting on 01/05/2023 for  hospital follow up.    12/01/22 went to [facility] for diverticulitis of colon with perforation. Admitted 9/18 discharged 9/25. Went to ER for 1 day lower abd pain bloating and cramps. CT with sigmoid diverticulitis with perf without abscess. Admitted for abx, was given IVF, flagyl and cefepime. He has f/u appt with GI with Dr. Russella Dar 01/12/23. Colonoscopy per pt scheduled for next Tuesday.   He is taking omeprazole 40 mg once daily and robinul 1 mg, however he states he is rarely taking this and only as needed however it is directed to take twice daily. Has lost 11 pounds since 9/18. He has a poor appetite, on a low fiber diet currently as well.   Wt Readings from Last 3 Encounters:  01/05/23 170 lb (77.1 kg)  12/29/22 170 lb (77.1 kg)  12/02/22 181 lb 14.1 oz (82.5 kg)   HLD: not taking atorvastatin right now he stopped since eing in ER because he didn't want to take it. He will consider restarting once colonoscopy stopped.  Lab Results  Component Value Date   CHOL 234 (H) 07/15/2022   HDL 64.90 07/15/2022   LDLCALC 153 (H) 07/15/2022   TRIG 79.0 07/15/2022   CHOLHDL 4 07/15/2022      Acute concerns:  He does state still with some discomfort and taking some medication for the spasms. He is having an issue with constipation which causes him some more abdominal pain. He does take tramadol for his knees daily which he thinks might be contributing to his symptoms of constipation.   Does have a slight cough, with yellow phlegm however has had some improvement.   No Known Allergies ----------------   Social history:  Relevant  past medical, surgical, family and social history reviewed and updated as indicated. Interim medical history since our last visit reviewed.  Allergies and medications reviewed and updated.  DATA REVIEWED: CHART IN EPIC    ROS: Negative unless specifically indicated above in HPI.    Current Outpatient Medications:    acetaminophen (TYLENOL) 325 MG tablet, Take 2 tablets (650 mg total) by mouth every 6 (six) hours as needed for mild pain, fever or headache., Disp: , Rfl:    albuterol (VENTOLIN HFA) 108 (90 Base) MCG/ACT inhaler, TAKE 2 PUFFS BY MOUTH EVERY 6 HOURS AS NEEDED FOR WHEEZE OR SHORTNESS OF BREATH, Disp: 18 each, Rfl: 0   atorvastatin (LIPITOR) 10 MG tablet, Take 1 tablet (10 mg total) by mouth daily., Disp: 90 tablet, Rfl: 3   glycopyrrolate (ROBINUL) 1 MG tablet, Take 1 tablet (1 mg total) by mouth 2 (two) times daily., Disp: 60 tablet, Rfl: 11   ibuprofen (ADVIL) 600 MG tablet, Take 1 tablet (600 mg total) by mouth every 8 (eight) hours as needed., Disp: 30 tablet, Rfl: 0   omeprazole (PRILOSEC) 40 MG capsule, Take 40 mg by mouth daily., Disp: , Rfl:    traMADol (ULTRAM) 50 MG tablet, Take 50 mg by mouth daily as needed (for knee pain)., Disp: , Rfl:       Objective:    BP  116/84 (BP Location: Left Arm, Patient Position: Sitting, Cuff Size: Normal)   Pulse 61   Temp 97.8 F (36.6 C) (Temporal)   Ht 6' (1.829 m)   Wt 170 lb (77.1 kg)   SpO2 98%   BMI 23.06 kg/m   Wt Readings from Last 3 Encounters:  01/05/23 170 lb (77.1 kg)  12/29/22 170 lb (77.1 kg)  12/02/22 181 lb 14.1 oz (82.5 kg)    Physical Exam Constitutional:      General: He is not in acute distress.    Appearance: Normal appearance. He is normal weight. He is not ill-appearing, toxic-appearing or diaphoretic.  Cardiovascular:     Rate and Rhythm: Normal rate and regular rhythm.  Pulmonary:     Effort: Pulmonary effort is normal.     Breath sounds: Normal breath sounds.  Abdominal:     General:  Abdomen is flat. There is no distension.     Tenderness: There is no abdominal tenderness. There is no guarding.  Musculoskeletal:        General: Normal range of motion.  Neurological:     General: No focal deficit present.     Mental Status: He is alert and oriented to person, place, and time. Mental status is at baseline.  Psychiatric:        Mood and Affect: Mood normal.        Behavior: Behavior normal.        Thought Content: Thought content normal.        Judgment: Judgment normal.         Assessment & Plan:   Return in about 3 months (around 04/07/2023) for follow up cholesterol . Dyslipidemia Assessment & Plan: Not improved. Non compliant.  Advised to restart atorvastatin. Will order lipid panel next visit.    Diverticulosis Assessment & Plan: Recent hospitalization for diverticulitis with perforation.  Pt to f/u with GI as scheduled and colonoscopy as scheduled for further work up and evaluation of ongoing abdominal pain.  Pt non compliant with proper direction of rubinol educated on 1 tablet twice daily instead of prn use.  Increase water intake to 90 oz daily For constipation try to limit use of tramadol which will worsen constipation.  Maintain low fiber diet as directed by GI      Mort Sawyers, FNP

## 2023-01-05 NOTE — Assessment & Plan Note (Signed)
Recent hospitalization for diverticulitis with perforation.  Pt to f/u with GI as scheduled and colonoscopy as scheduled for further work up and evaluation of ongoing abdominal pain.  Pt non compliant with proper direction of rubinol educated on 1 tablet twice daily instead of prn use.  Increase water intake to 90 oz daily For constipation try to limit use of tramadol which will worsen constipation.  Maintain low fiber diet as directed by GI

## 2023-01-05 NOTE — Assessment & Plan Note (Signed)
Not improved. Non compliant.  Advised to restart atorvastatin. Will order lipid panel next visit.

## 2023-01-05 NOTE — Patient Instructions (Addendum)
  Recommendation is for daily flonase.   Start taking your robinul 1 mg twice daily as directed on the bottle, try to be more consistent.

## 2023-01-12 ENCOUNTER — Ambulatory Visit (AMBULATORY_SURGERY_CENTER): Payer: 59 | Admitting: Gastroenterology

## 2023-01-12 ENCOUNTER — Encounter: Payer: Self-pay | Admitting: Gastroenterology

## 2023-01-12 VITALS — BP 125/90 | HR 68 | Temp 97.8°F | Resp 13 | Ht 72.0 in | Wt 170.0 lb

## 2023-01-12 DIAGNOSIS — K5720 Diverticulitis of large intestine with perforation and abscess without bleeding: Secondary | ICD-10-CM | POA: Diagnosis not present

## 2023-01-12 DIAGNOSIS — Z800 Family history of malignant neoplasm of digestive organs: Secondary | ICD-10-CM | POA: Diagnosis not present

## 2023-01-12 DIAGNOSIS — R933 Abnormal findings on diagnostic imaging of other parts of digestive tract: Secondary | ICD-10-CM | POA: Diagnosis not present

## 2023-01-12 MED ORDER — SODIUM CHLORIDE 0.9 % IV SOLN: 500.0000 mL | INTRAVENOUS | Status: DC

## 2023-01-12 NOTE — Op Note (Signed)
[facility] Patient Name: Marcus Andrews Procedure Date: 01/12/2023 9:21 AM MRN: [id number] Endoscopist: Meryl Dare , MD, [phone] Age: 62 Referring MD:  Date of Birth: [birth year] Gender: Male Account #: [id number] Procedure:                Colonoscopy Indications:              Abnormal CT of the GI tract, Diverticulitis, Family                            history of colon cancer, 1st degree relative Medicines:                Monitored Anesthesia Care Procedure:                Pre-Anesthesia Assessment:                           - Prior to the procedure, a History and Physical                            was performed, and patient medications and                            allergies were reviewed. The patient's tolerance of                            previous anesthesia was also reviewed. The risks                            and benefits of the procedure and the sedation                            options and risks were discussed with the patient.                            All questions were answered, and informed consent                            was obtained. Prior Anticoagulants: The patient has                            taken no anticoagulant or antiplatelet agents. ASA                            Grade Assessment: II - A patient with mild systemic                            disease. After reviewing the risks and benefits,                            the patient was deemed in satisfactory condition to                            undergo the procedure.  After obtaining informed consent, the colonoscope                            was passed under direct vision. Throughout the                            procedure, the patient's blood pressure, pulse, and                            oxygen saturations were monitored continuously. The                            Olympus Scope SN [id number] was introduced through the                            anus  and advanced to the the cecum, identified by                            appendiceal orifice and ileocecal valve. The                            ileocecal valve, appendiceal orifice, and rectum                            were photographed. The quality of the bowel                            preparation was good. The colonoscopy was performed                            without difficulty. The patient tolerated the                            procedure well. Scope In: 9:26:53 AM Scope Out: 9:37:36 AM Scope Withdrawal Time: 0 hours 8 minutes 26 seconds  Total Procedure Duration: 0 hours 10 minutes 43 seconds  Findings:                 The perianal and digital rectal examinations were                            normal.                           Multiple medium-mouthed and small-mouthed                            diverticula were found in the left colon. There was                            narrowing of the colon in association with the                            diverticular opening. There was no evidence of  diverticular bleeding.                           External and internal hemorrhoids were found during                            retroflexion. The hemorrhoids were moderate and                            Grade I (internal hemorrhoids that do not prolapse).                           The exam was otherwise without abnormality on                            direct and retroflexion views. Complications:            No immediate complications. Estimated blood loss:                            None. Estimated Blood Loss:     Estimated blood loss: none. Impression:               - Moderate diverticulosis in the left colon.                           - Internal and external hemorrhoids.                           - The examination was otherwise normal on direct                            and retroflexion views.                           - No specimens  collected. Recommendation:           - Repeat colonoscopy in 5 years for surveillance.                           - Patient has a contact number available for                            emergencies. The signs and symptoms of potential                            delayed complications were discussed with the                            patient. Return to normal activities tomorrow.                            Written discharge instructions were provided to the                            patient.                           -  Resume previous diet adding high fiber.                           - Continue present medications.                           - Follow up with General Surgery as planned -                            consider segmental colectomy. Meryl Dare, MD 01/12/2023 9:44:41 AM This report has been signed electronically.

## 2023-01-12 NOTE — Progress Notes (Signed)
History & Physical  Primary Care Physician:  Mort Sawyers, FNP Primary Gastroenterologist: Claudette Head, MD  Impression / Plan:  Recent history of diverticulitis, family history of colon cancer in a first-degree relative for colonoscopy.  CHIEF COMPLAINT: Recent diverticulitis, [facility]  HPI: Marcus Andrews is a 62 y.o. male with a recent history of diverticulitis and a family history of colon cancer in a first-degree relative (mother) for colonoscopy.    Past Medical History:  Diagnosis Date   Arthritis    Colon polyp    Diverticulosis    Refusal of blood transfusions as patient is Jehovah's Witness     Past Surgical History:  Procedure Laterality Date   COLONOSCOPY     KNEE ARTHROSCOPY WITH MEDIAL MENISECTOMY Right 02/05/2020   Procedure: Right knee arthroscopy with partial medial and lateral menisectomy, chondroplasty, partial synovectomy, removal loose body;  Surgeon: Lyndle Herrlich, MD;  Location: [facility] ORS;  Service: Orthopedics;  Laterality: Right;   MENISCUS REPAIR Left 01/2014   TOOTH EXTRACTION     UPPER GASTROINTESTINAL ENDOSCOPY  05/29/2021    Prior to Admission medications   Medication Sig Start Date End Date Taking? Authorizing Provider  acetaminophen (TYLENOL) 325 MG tablet Take 2 tablets (650 mg total) by mouth every 6 (six) hours as needed for mild pain, fever or headache. 12/09/22   Lonia Blood, MD  albuterol (VENTOLIN HFA) 108 (90 Base) MCG/ACT inhaler TAKE 2 PUFFS BY MOUTH EVERY 6 HOURS AS NEEDED FOR WHEEZE OR SHORTNESS OF BREATH 09/24/22   Mort Sawyers, FNP  atorvastatin (LIPITOR) 10 MG tablet Take 1 tablet (10 mg total) by mouth daily. 07/23/22   Mort Sawyers, FNP  glycopyrrolate (ROBINUL) 1 MG tablet Take 1 tablet (1 mg total) by mouth 2 (two) times daily. 04/21/22   Meryl Dare, MD  ibuprofen (ADVIL) 600 MG tablet Take 1 tablet (600 mg total) by mouth every 8 (eight) hours as needed. 08/26/22   Mort Sawyers, FNP  omeprazole (PRILOSEC) 40 MG  capsule Take 40 mg by mouth daily.    [provider]  traMADol (ULTRAM) 50 MG tablet Take 50 mg by mouth daily as needed (for knee pain). 06/11/20   [provider]    Current Outpatient Medications  Medication Sig Dispense Refill   acetaminophen (TYLENOL) 325 MG tablet Take 2 tablets (650 mg total) by mouth every 6 (six) hours as needed for mild pain, fever or headache.     albuterol (VENTOLIN HFA) 108 (90 Base) MCG/ACT inhaler TAKE 2 PUFFS BY MOUTH EVERY 6 HOURS AS NEEDED FOR WHEEZE OR SHORTNESS OF BREATH 18 each 0   atorvastatin (LIPITOR) 10 MG tablet Take 1 tablet (10 mg total) by mouth daily. 90 tablet 3   glycopyrrolate (ROBINUL) 1 MG tablet Take 1 tablet (1 mg total) by mouth 2 (two) times daily. 60 tablet 11   ibuprofen (ADVIL) 600 MG tablet Take 1 tablet (600 mg total) by mouth every 8 (eight) hours as needed. 30 tablet 0   omeprazole (PRILOSEC) 40 MG capsule Take 40 mg by mouth daily.     traMADol (ULTRAM) 50 MG tablet Take 50 mg by mouth daily as needed (for knee pain).     No current facility-administered medications for this visit.    Allergies as of 01/12/2023   (No Known Allergies)    Family History  Problem Relation Age of Onset   Breast cancer Mother    Colon cancer Mother 7   Colon polyps Mother  Lymphoma Mother    Heart disease Father    Heart failure Father    Heart attack Father 15   Hypertension Sister    Hyperlipidemia Sister    Asthma Sister    Aneurysm Sister    Hypertension Brother    Hyperlipidemia Brother    Deep vein thrombosis Brother    Heart attack Brother        50-60   Hypertension Maternal Grandmother    Diabetes Maternal Grandmother    Hypertension Maternal Aunt    Cancer Maternal Aunt        type unknown   Hypertension Maternal Uncle    Liver cancer Maternal Uncle    Hypertension Paternal Aunt    Hypertension Paternal Uncle    Esophageal cancer Neg Hx    Rectal cancer Neg Hx    Stomach cancer Neg Hx      Social History   Socioeconomic History   Marital status: Single    Spouse name: Not on file   Number of children: 1   Years of education: Not on file   Highest education level: Not on file  Occupational History   Occupation: owner/operator  Tobacco Use   Smoking status: Former    Current packs/day: 0.00    Types: Cigarettes    Quit date: 05/28/2018    Years since quitting: 4.6   Smokeless tobacco: Never  Vaping Use   Vaping status: Never Used  Substance and Sexual Activity   Alcohol use: Not Currently    Alcohol/week: 1.0 standard drink of alcohol    Types: 1 Cans of beer per week    Comment: a few times a year   Drug use: Never   Sexual activity: Not Currently    Birth control/protection: Condom  Other Topics Concern   Not on file  Social History Narrative   05/22/21   From: the area   Living: alone   Work: Teacher, adult education      Family: family is all over the place, has cousins which are local in [place]      Enjoys: learning to play guitar, DJ, music, Counsellor      Exercise: knee/back pain limit this   Diet: could be better - avoids red meats/pork      Safety   Seat belts: Yes    Guns: No   Safe in relationships: Yes       Daughter: 1988   Social Determinants of Health   Financial Resource Strain: Not on file  Food Insecurity: No Food Insecurity (12/02/2022)   Hunger Vital Sign    Worried About Running Out of Food in the Last Year: Never true    Ran Out of Food in the Last Year: Never true  Transportation Needs: No Transportation Needs (12/02/2022)   PRAPARE - Administrator, Civil Service (Medical): No    Lack of Transportation (Non-Medical): No  Physical Activity: Not on file  Stress: Not on file  Social Connections: Not on file  Intimate Partner Violence: Not At Risk (12/02/2022)   Humiliation, Afraid, Rape, and Kick questionnaire    Fear of Current or Ex-Partner: No    Emotionally Abused: No    Physically Abused: No     Sexually Abused: No    Review of Systems:  All systems reviewed were negative except where noted in HPI.   Physical Exam:  General:  Alert, well-developed, in NAD Head:  Normocephalic and atraumatic. Eyes:  Sclera clear, no icterus.  Conjunctiva pink. Ears:  Normal auditory acuity. Mouth:  No deformity or lesions.  Neck:  Supple; no masses. Lungs:  Clear throughout to auscultation.   No wheezes, crackles, or rhonchi.  Heart:  Regular rate and rhythm; no murmurs. Abdomen:  Soft, nondistended, nontender. No masses, hepatomegaly. No palpable masses.  Normal bowel sounds.    Rectal:  Deferred   Msk:  Symmetrical without gross deformities. Extremities:  Without edema. Neurologic:  Alert and  oriented x 4; grossly normal neurologically. Skin:  Intact without significant lesions or rashes. Psych:  Alert and cooperative. Normal mood and affect.   Venita Lick. Russella Dar  01/12/2023, 8:42 AM See Loretha Stapler, [facility], to contact our on call provider

## 2023-01-12 NOTE — Progress Notes (Signed)
Sedate, gd SR, tolerated procedure well, VSS, report to RN 

## 2023-01-12 NOTE — Patient Instructions (Addendum)
Recommendation:- Repeat colonoscopy in 5 years for surveillance.                           - Patient has a contact number available for                            emergencies. The signs and symptoms of potential                            delayed complications were discussed with the                            patient. Return to normal activities tomorrow.                            Written discharge instructions were provided to the                            patient.                           - Resume previous diet adding high fiber.                           - Continue present medications.                           - Follow up with General Surgery as planned -                            consider segmental colectomy.  YOU HAD AN ENDOSCOPIC PROCEDURE TODAY AT THE [facility]:   Refer to the procedure report that was given to you for any specific questions about what was found during the examination.  If the procedure report does not answer your questions, please call your gastroenterologist to clarify.  If you requested that your care partner not be given the details of your procedure findings, then the procedure report has been included in a sealed envelope for you to review at your convenience later.  YOU SHOULD EXPECT: Some feelings of bloating in the abdomen. Passage of more gas than usual.  Walking can help get rid of the air that was put into your GI tract during the procedure and reduce the bloating. If you had a lower endoscopy (such as a colonoscopy or flexible sigmoidoscopy) you may notice spotting of blood in your stool or on the toilet paper. If you underwent a bowel prep for your procedure, you may not have a normal bowel movement for a few days.  Please Note:  You might notice some irritation and congestion in your nose or some drainage.  This is from the oxygen used during your procedure.  There is no need for concern and it should clear up in a day or so.  SYMPTOMS TO  REPORT IMMEDIATELY:  Following lower endoscopy (colonoscopy or flexible sigmoidoscopy):  Excessive amounts of blood in the stool  Significant tenderness or worsening of abdominal pains  Swelling of the abdomen that is new, acute  Fever of 100F or higher  Handouts on hemorrhoids,  diverticulosis and high fiber diet given.  For urgent or emergent issues, a gastroenterologist can be reached at any hour by calling [phone]. Do not use MyChart messaging for urgent concerns.    DIET:  We do recommend a small meal at first, but then you may proceed to your regular diet.  Drink plenty of fluids but you should avoid alcoholic beverages for 24 hours.  ACTIVITY:  You should plan to take it easy for the rest of today and you should NOT DRIVE or use heavy machinery until tomorrow (because of the sedation medicines used during the test).    FOLLOW UP: Our staff will call the number listed on your records the next business day following your procedure.  We will call around 7:15- 8:00 am to check on you and address any questions or concerns that you may have regarding the information given to you following your procedure. If we do not reach you, we will leave a message.     If any biopsies were taken you will be contacted by phone or by letter within the next 1-3 weeks.  Please call us at [phone] if you have not heard about the biopsies in 3 weeks.    SIGNATURES/CONFIDENTIALITY: You and/or your care partner have signed paperwork which will be entered into your electronic medical record.  These signatures attest to the fact that that the information above on your After Visit Summary has been reviewed and is understood.  Full responsibility of the confidentiality of this discharge information lies with you and/or your care-partner.

## 2023-01-13 ENCOUNTER — Telehealth: Payer: Self-pay

## 2023-01-13 ENCOUNTER — Telehealth: Payer: Self-pay | Admitting: *Deleted

## 2023-01-13 MED ORDER — CIPROFLOXACIN HCL 500 MG PO TABS
500.0000 mg | ORAL_TABLET | Freq: Two times a day (BID) | ORAL | 0 refills | Status: AC
Start: 2023-01-13 — End: 2023-01-23

## 2023-01-13 MED ORDER — METRONIDAZOLE 500 MG PO TABS
500.0000 mg | ORAL_TABLET | Freq: Two times a day (BID) | ORAL | 0 refills | Status: AC
Start: 2023-01-13 — End: 2023-01-23

## 2023-01-13 NOTE — Telephone Encounter (Signed)
Patient called in with concerns about feeling sore in his lower abdomin. He states it feels like when you do sit ups like a muscular soreness. He is eating ok and had a BM that was "getting close to normal" and had no other issues. I explained that sometimes during the procedure we have to push on the abdomin to help manipulate the scope and that this seems like a probable cause of the soreness that he is feeling. I did let him know that I would send this to his doctor and let him know that if it gets worse or does not resolve in the next day to give [place] a call back. He agreed and had no further questions or concerns.

## 2023-01-13 NOTE — Telephone Encounter (Signed)
Called Patient back and discussed Dr. Anselm Jungling recommendations about starting Cipro and Flagyl BID x 10 days based on his history. Patient agreed and had no further questions. Will call back with any problems or worsening pain.

## 2023-01-13 NOTE — Addendum Note (Signed)
Addended by: Waynard Edwards T on: 01/13/2023 03:01 PM   Modules accepted: Orders

## 2023-01-13 NOTE — Telephone Encounter (Signed)
Inbound call from patient, states he is having some "unusual" abdominal pain. Patient would like to discuss symptoms with a nurse.

## 2023-01-13 NOTE — Telephone Encounter (Signed)
Although likely abdominal wall symptoms, given his history there is a concern for recurrent diverticulitis.  Cipro 500 mg bid #20, Flagyl 500 mg bid #20.

## 2023-01-13 NOTE — Telephone Encounter (Signed)
Attempted f/u phone call. No answer. Left message. °

## 2023-01-14 ENCOUNTER — Telehealth: Payer: Self-pay

## 2023-01-14 NOTE — Telephone Encounter (Signed)
Called [facility] and spoke with Elane Fritz to refer patient for consideration for segmental colectomy due to recurrent diverticulitis. Elane Fritz reports she will get him scheduled to see a PA at their office and will call the patient directly to schedule.

## 2023-01-19 ENCOUNTER — Telehealth: Payer: Self-pay | Admitting: Gastroenterology

## 2023-01-19 NOTE — Telephone Encounter (Signed)
Left message on machine to call back  

## 2023-01-19 NOTE — Telephone Encounter (Signed)
Pt is on abx for presumed diverticulitis.  He has nausea that comes and goes for about 2-3 hours after taking the medication.  He is taking on an empty stomach.  He has 3 days left.  I have suggested to take about 30 min after having some food, piece of toast or crackers.  He will try that and call back if that does not help.

## 2023-01-19 NOTE — Telephone Encounter (Signed)
Inbound call from patient stating since taking 2 antibiotic prescriptions that were prescribed he has been feeling sick with nausea. States it has been getting worse and today is the worst he has felt. Patient is requesting a call to discuss further and to be advised. Please advise, thank you.

## 2023-01-22 IMAGING — US US EXTREM LOW VENOUS
1 series · 13 of 24 positions shown · non-contrast
Comparison: None.

CLINICAL DATA: Pain and swelling for several hours



[Series 1001: lev · 13 of 47 slices shown]
[im 1/47]
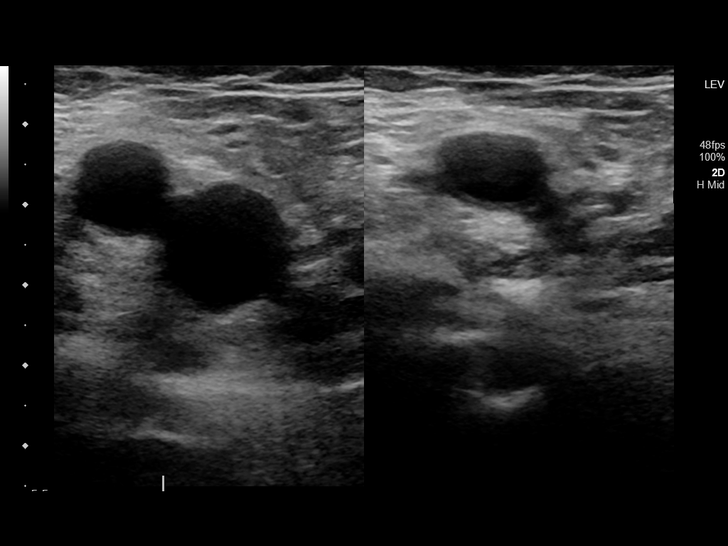
[im 5/47]
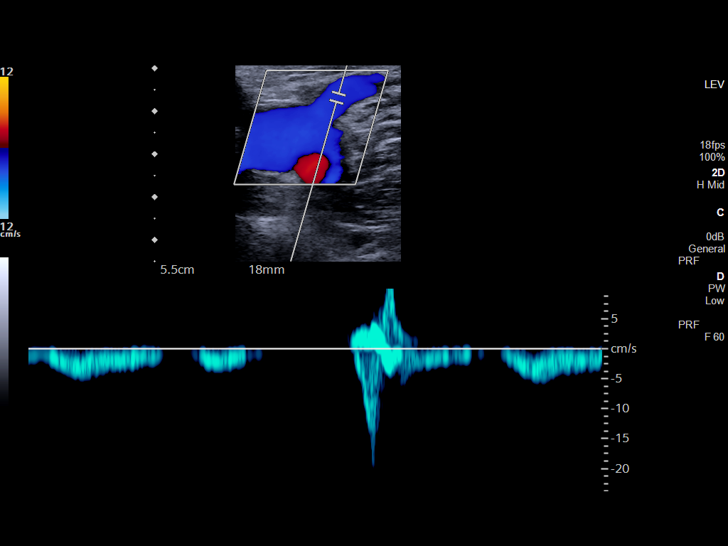
[im 9/47]
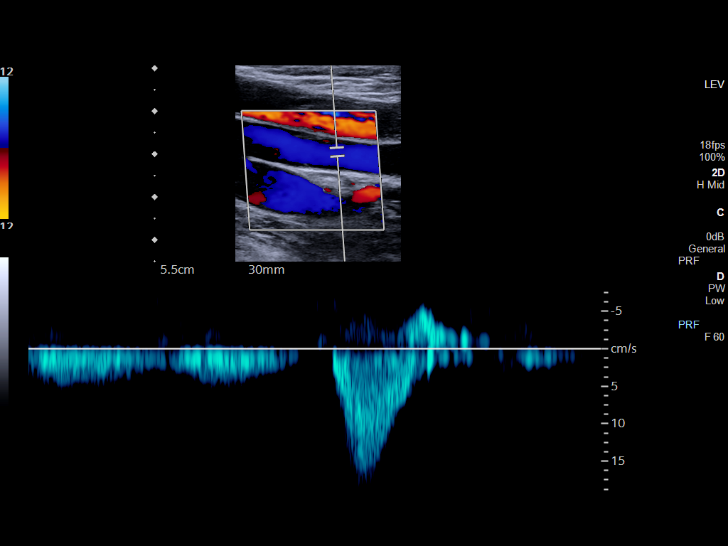
[im 13/47]
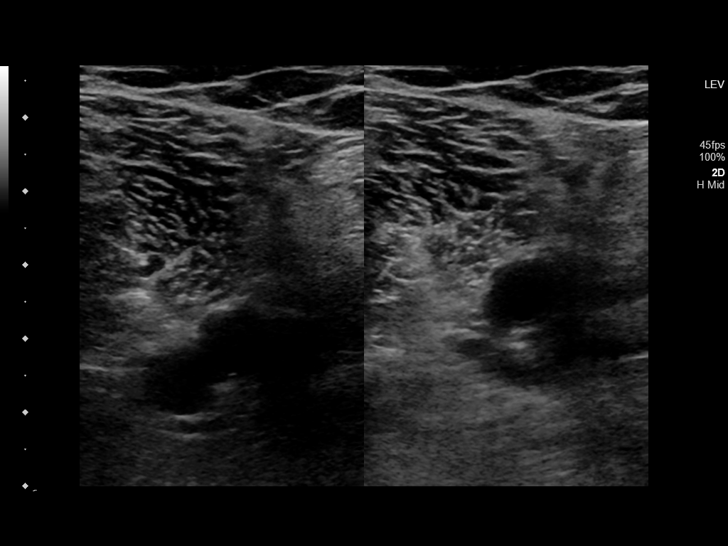
[im 17/47]
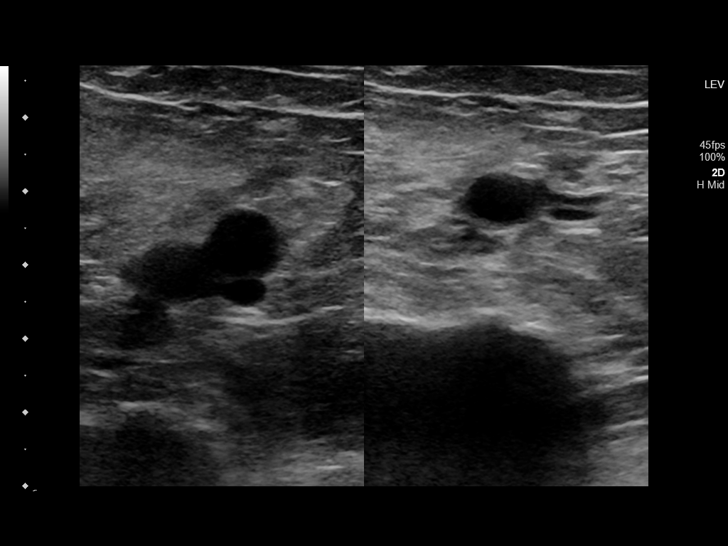
[im 21/47]
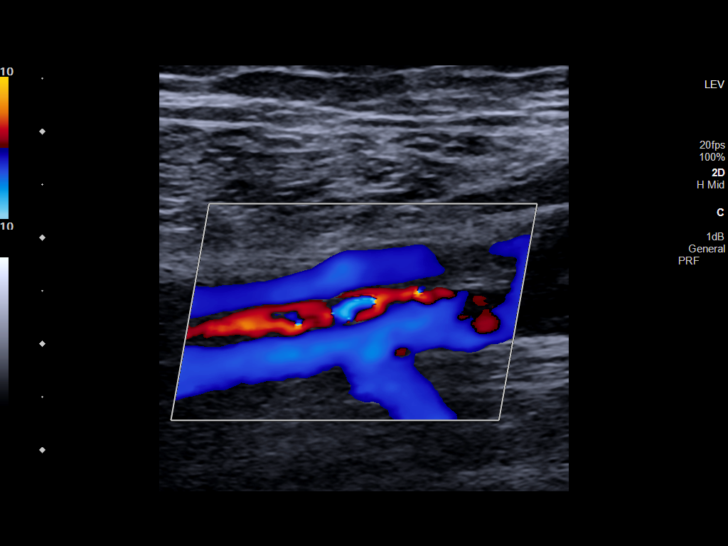
[im 25/47]
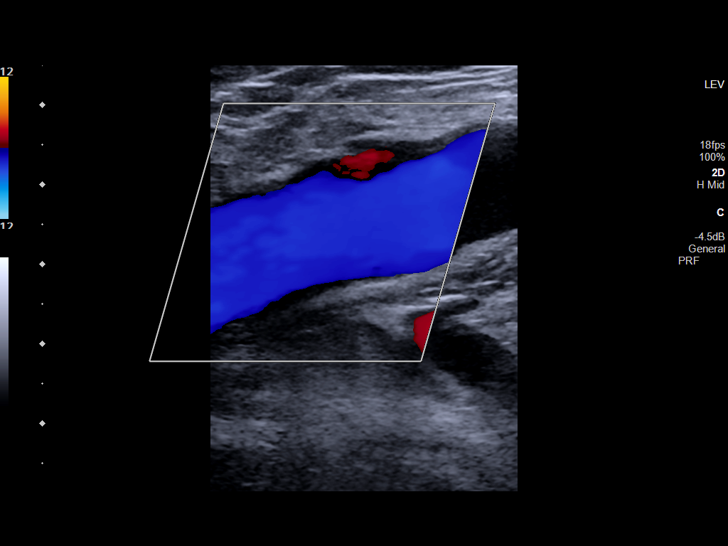
[im 27/47]
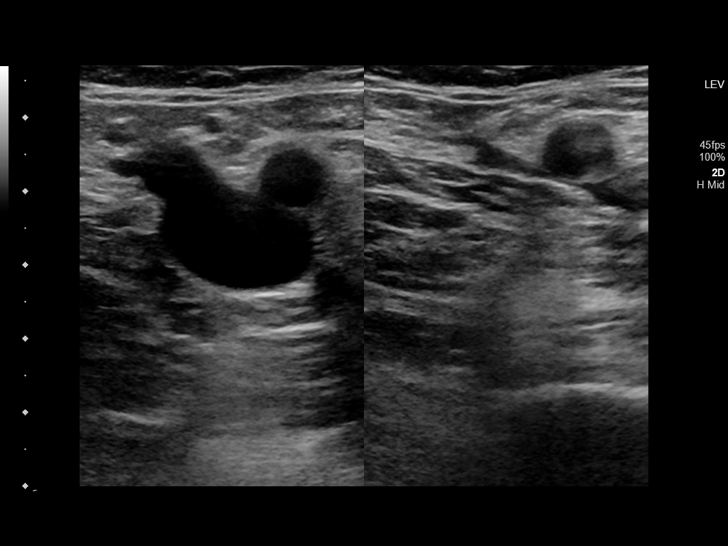
[im 31/47]
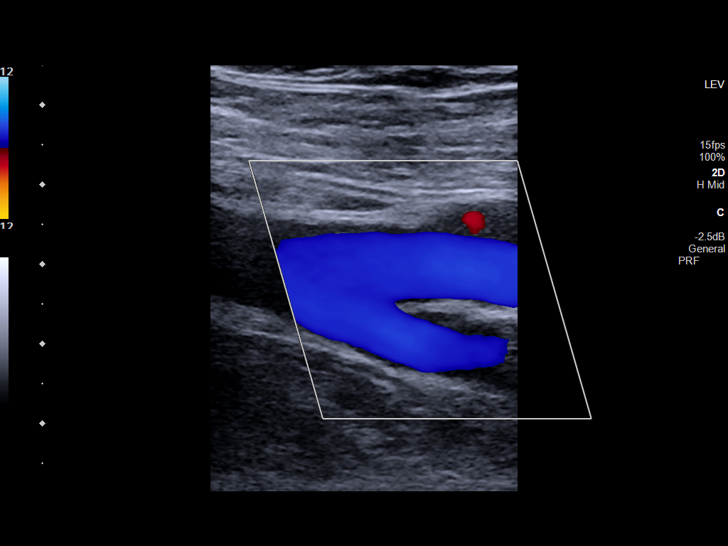
[im 35/47]
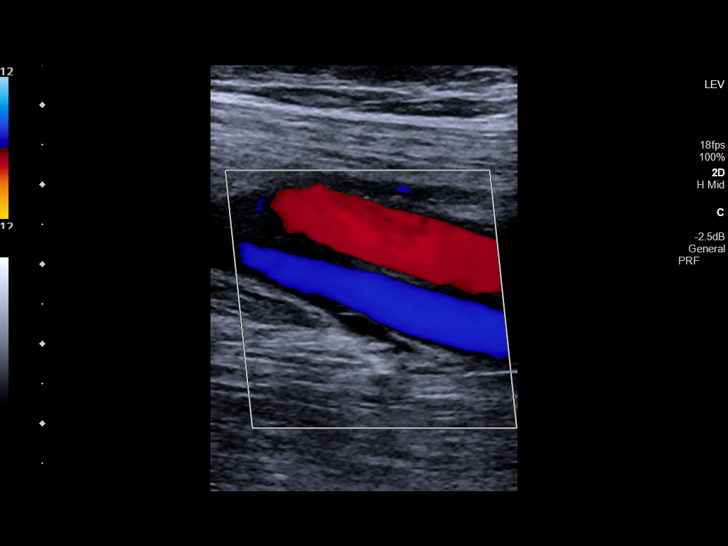
[im 39/47]
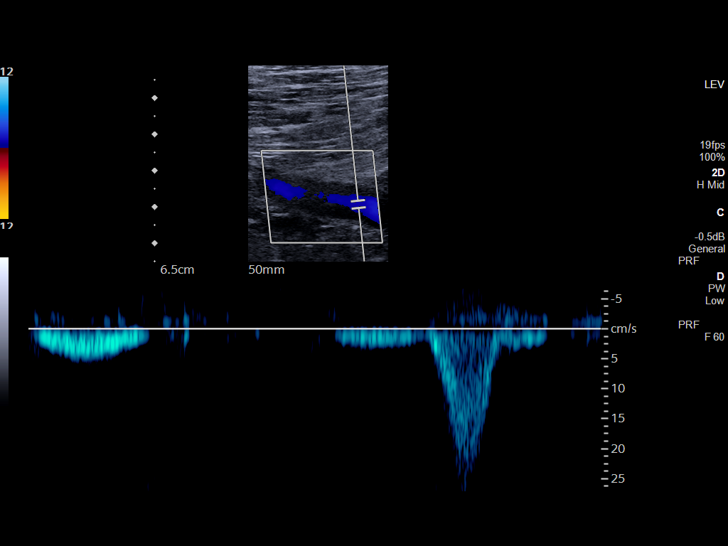
[im 43/47]
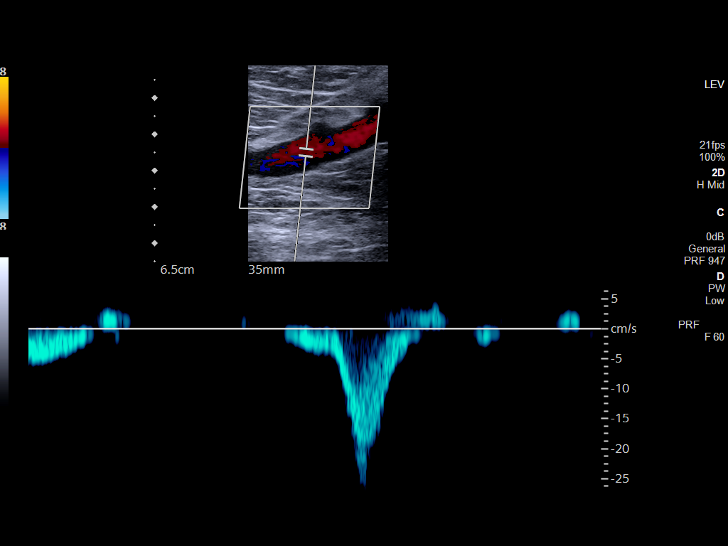
[im 47/47]
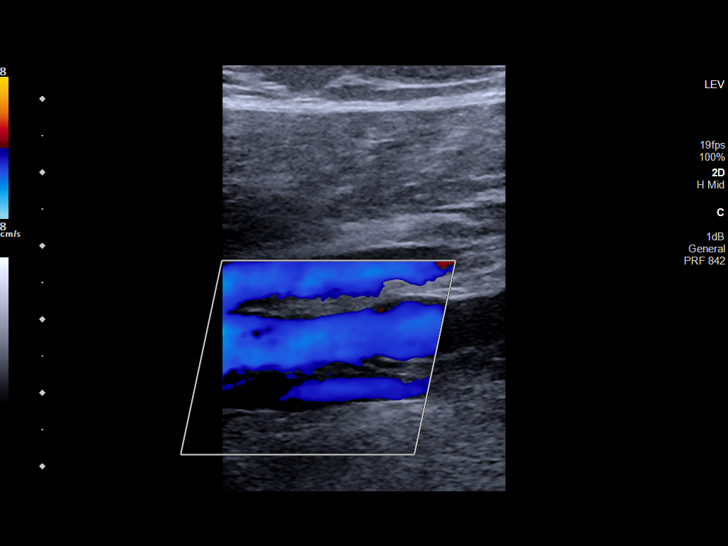

[13 of 24 positions shown; findings below may reference images not displayed]

FINDINGS: RIGHT LOWER EXTREMITY

Common Femoral Vein: No evidence of thrombus. Normal
compressibility, respiratory phasicity and response to augmentation.

Saphenofemoral Junction: No evidence of thrombus. Normal
compressibility and flow on color Doppler imaging.

Profunda Femoral Vein: No evidence of thrombus. Normal
compressibility and flow on color Doppler imaging.

Femoral Vein: No evidence of thrombus. Normal compressibility,
respiratory phasicity and response to augmentation.

Popliteal Vein: No evidence of thrombus. Normal compressibility,
respiratory phasicity and response to augmentation.

Calf Veins: No evidence of thrombus. Normal compressibility and flow
on color Doppler imaging.

Superficial Great Saphenous Vein: No evidence of thrombus. Normal
compressibility.

Venous Reflux:  None.

Other Findings:  None.

LEFT LOWER EXTREMITY

Common Femoral Vein: No evidence of thrombus. Normal
compressibility, respiratory phasicity and response to augmentation.

Saphenofemoral Junction: No evidence of thrombus. Normal
compressibility and flow on color Doppler imaging.

Profunda Femoral Vein: No evidence of thrombus. Normal
compressibility and flow on color Doppler imaging.

Femoral Vein: No evidence of thrombus. Normal compressibility,
respiratory phasicity and response to augmentation.

Popliteal Vein: No evidence of thrombus. Normal compressibility,
respiratory phasicity and response to augmentation.

Calf Veins: No evidence of thrombus. Normal compressibility and flow
on color Doppler imaging.

Superficial Great Saphenous Vein: No evidence of thrombus. Normal
compressibility.

Venous Reflux:  None.

Other Findings:  None.
IMPRESSION: No evidence of deep venous thrombosis in either lower extremity.

## 2023-01-29 ENCOUNTER — Other Ambulatory Visit: Payer: Self-pay | Admitting: Gastroenterology

## 2023-01-29 ENCOUNTER — Telehealth: Payer: Self-pay | Admitting: Gastroenterology

## 2023-01-29 MED ORDER — OMEPRAZOLE 40 MG PO CPDR: 40.0000 mg | DELAYED_RELEASE_CAPSULE | Freq: Every day | ORAL | 8 refills | Status: DC

## 2023-01-29 NOTE — Telephone Encounter (Signed)
Inbound call from patient, would like refill [facility].

## 2023-01-29 NOTE — Telephone Encounter (Signed)
Prescription sent to patient's pharmacy.

## 2023-02-01 NOTE — Telephone Encounter (Signed)
PT is being told that he needs an alternative to his omeprazole. He is a little confused as to why. Please advise.

## 2023-02-02 ENCOUNTER — Telehealth: Payer: Self-pay | Admitting: Pharmacy Technician

## 2023-02-02 ENCOUNTER — Other Ambulatory Visit: Payer: Self-pay | Admitting: Gastroenterology

## 2023-02-02 ENCOUNTER — Other Ambulatory Visit (HOSPITAL_COMMUNITY): Payer: Self-pay

## 2023-02-02 MED ORDER — PANTOPRAZOLE SODIUM 40 MG PO TBEC: 40.0000 mg | DELAYED_RELEASE_TABLET | Freq: Every day | ORAL | 1 refills | Status: DC

## 2023-02-02 NOTE — Addendum Note (Signed)
Addended by: Illene Bolus on: 02/02/2023 08:34 AM   Modules accepted: Orders

## 2023-02-02 NOTE — Telephone Encounter (Signed)
Pharmacy Patient Advocate Encounter  Received notification from [employer] that Prior Authorization for Omeprazole has been APPROVED from 02/02/23 to 02/02/24  filled today   PA #/Case ID/Reference #: PA Case ID #: [id number]

## 2023-02-02 NOTE — Telephone Encounter (Signed)
Pharmacy Patient Advocate Encounter  Received notification from [employer] that Prior Authorization for Rabeprazole 20mg  has been APPROVED from 02/02/23 to 02/02/24. Ran test claim, Copay is $8.84. This test claim was processed through [facility]- copay amounts may vary at other pharmacies due to pharmacy/plan contracts, or as the patient moves through the different stages of their insurance plan.   PA #/Case ID/Reference #: PA Case ID #: [id number]

## 2023-02-02 NOTE — Telephone Encounter (Signed)
Informed patient that PA team is working on the PA for omeprazole. Informed patient that as soon as we hear back from them then we will be in touch. Patient verbalized understanding.

## 2023-02-02 NOTE — Telephone Encounter (Signed)
Patient called and stated he was returning [place] call about having more questions prior to his last conversation with Marchelle Folks. He also stated that he knows what happened and why his medication was being denied. Patient is also requesting a call back. Please advise.

## 2023-02-02 NOTE — Telephone Encounter (Signed)
PA has been submitted, and telephone encounter has been created. 

## 2023-02-02 NOTE — Telephone Encounter (Signed)
Pharmacy Patient Advocate Encounter   Received notification from RX Request Messages that prior authorization for RABEPRAZOLE 20MG  is required/requested.   Insurance verification completed.   The patient is insured through [facility] .   Per test claim: PA required; PA submitted to above mentioned insurance via CoverMyMeds Key/confirmation #/EOC [id number] Status is pending

## 2023-02-02 NOTE — Telephone Encounter (Signed)
Pharmacy Patient Advocate Encounter   Received notification from RX Request Messages that prior authorization for OMEPRAZOLE 40MG  is required/requested.   Insurance verification completed.   The patient is insured through [facility] .   Per test claim: PA required; PA submitted to above mentioned insurance via CoverMyMeds Key/confirmation #/EOC [id number] Status is pending

## 2023-02-02 NOTE — Telephone Encounter (Signed)
ERROR

## 2023-02-02 NOTE — Telephone Encounter (Signed)
Patient states he spoke to the pharmacist yesterday and they informed him that his insurance will no longer cover omeprazole because it is over the counter. Asked patient if they told him what other medications would be covered by his insurance company. Patient states they did not tell him. Informed patient he can always call his insurance to find out what the preferred medications are or I can send a alternative to the pharmacy and just see if it's covered by his insurance. Patient states he would like me to send an alternative. Informed patient I will send in pantoprazole 40 mg daily in the place of omeprazole 40 mg daily. Informed patient to let us know if this medication is also not covered.

## 2023-02-04 ENCOUNTER — Other Ambulatory Visit (HOSPITAL_COMMUNITY): Payer: Self-pay

## 2023-02-04 ENCOUNTER — Ambulatory Visit: Payer: 59 | Admitting: Gastroenterology

## 2023-02-04 NOTE — Telephone Encounter (Signed)
PA has been approved

## 2023-02-08 ENCOUNTER — Ambulatory Visit: Payer: Self-pay | Admitting: Surgery

## 2023-02-08 ENCOUNTER — Encounter: Payer: Self-pay | Admitting: Surgery

## 2023-02-08 DIAGNOSIS — K5720 Diverticulitis of large intestine with perforation and abscess without bleeding: Secondary | ICD-10-CM | POA: Diagnosis not present

## 2023-02-08 DIAGNOSIS — Z531 Procedure and treatment not carried out because of patient's decision for reasons of belief and group pressure: Secondary | ICD-10-CM | POA: Diagnosis not present

## 2023-02-16 ENCOUNTER — Other Ambulatory Visit: Payer: Self-pay | Admitting: Urology

## 2023-03-08 ENCOUNTER — Other Ambulatory Visit: Payer: Self-pay | Admitting: Gastroenterology

## 2023-03-12 NOTE — Progress Notes (Addendum)
PCP - Mort Sawyers, FNP LOV 01-05-23 epic Cardiologist -   PPM/ICD -  Device Orders -  Rep Notified -   Chest x-ray -  EKG - 12-02-22 epic Stress Test -  ECHO -  Cardiac Cath -   Sleep Study -  CPAP -   Fasting Blood Sugar -  Checks Blood Sugar _____ times a day  Blood Thinner Instructions: Aspirin Instructions:  ERAS Protcol - PRE-SURGERY Ensure    COVID vaccine -  Activity-- Anesthesia review: Abnormal R wave progression on EKG, 12-02-22   Patient denies shortness of breath, fever, cough and chest pain at PAT appointment   All instructions explained to the patient, with a verbal understanding of the material. Patient agrees to go over the instructions while at home for a better understanding. Patient also instructed to self quarantine after being tested for COVID-19. The opportunity to ask questions was provided.

## 2023-03-12 NOTE — Patient Instructions (Addendum)
 SURGICAL WAITING ROOM VISITATION  Patients having surgery or a procedure may have no more than 2 support people in the waiting area - these visitors may rotate.    Children under the age of 99 must have an adult with them who is not the patient.   If the patient needs to stay at the [facility] during part of their recovery, the visitor guidelines for inpatient rooms apply. Pre-op nurse will coordinate an appropriate time for 1 support person to accompany patient in pre-op.  This support person may not rotate.    Please refer to the [facility] website for the visitor guidelines for Inpatients (after your surgery is over and you are in a regular room).       Your procedure is scheduled on: 03-25-23    Report to [facility]    Report to admitting at     10:15  AM   Call this number if you have problems the morning of surgery [phone]   FOLLOW A CLEAR LIQUID DIET THE DAY OF YOUR BOWEL PREP TO PREVENT DEHYDRATION               FOLLOW BOWEL PREP PER SURGEONS OFFICE   After Midnight you may have the following liquids until 9:30 AM/  DAY OF SURGERY  then nothing by mouth  Water  Non-Citrus Juices (without pulp, NO RED-Apple, White grape, White cranberry) Black Coffee (NO MILK/CREAM OR CREAMERS, sugar ok)  Clear Tea (NO MILK/CREAM OR CREAMERS, sugar ok) regular and decaf                             Plain Jell-O (NO RED)                                           Fruit ices (not with fruit pulp, NO RED)                                     Popsicles (NO RED)                                                               Sports drinks like Gatorade (NO RED)              Drink 2 Ensure/G2 drinks AT 10:00 PM the night before surgery.        The day of surgery:  Drink ONE (1) Pre-Surgery  G2 at  9:30 AM the morning of surgery. Drink in one sitting. Do not sip.  This drink was given to you during your [facility]  pre-op appointment visit. Nothing else to drink after  completing the  Pre-Surgery  G2   by 9:30am .          If you have questions, please contact your surgeon's office.   FOLLOW BOWEL PREP AND ANY ADDITIONAL PRE OP INSTRUCTIONS YOU RECEIVED FROM YOUR SURGEON'S OFFICE!!!     Oral Hygiene is also important to reduce your risk of infection.  Remember - BRUSH YOUR TEETH THE MORNING OF SURGERY WITH YOUR REGULAR TOOTHPASTE  DENTURES WILL BE REMOVED PRIOR TO SURGERY PLEASE DO NOT APPLY Poly grip OR ADHESIVES!!!   Do NOT smoke after Midnight   Stop all vitamins and herbal supplements 7 days before surgery.   Take these medicines the morning of surgery with A SIP OF WATER : Tramadol  or tylenol  if needed, omeprazole , Robinol, atorvastatin , bring rescue inhaler                                  You may not have any metal on your body including hair pins, jewelry, and body piercing             Do not wear lotions, powders, perfumes/cologne, or deodorant               Men may shave face and neck.   Do not bring valuables to the [facility]. [facility] IS NOT             RESPONSIBLE   FOR VALUABLES.   Contacts, glasses, dentures or bridgework may not be worn into surgery.   Bring small overnight bag day of surgery.   DO NOT BRING YOUR HOME MEDICATIONS TO THE [facility]. PHARMACY WILL DISPENSE MEDICATIONS LISTED ON YOUR MEDICATION LIST TO YOU DURING YOUR ADMISSION IN THE [facility]!    Patients discharged on the day of surgery will not be allowed to drive home.  Someone NEEDS to stay with you for the first 24 hours after anesthesia.   Special Instructions: Bring a copy of your healthcare power of attorney and living will documents the day of surgery if you haven't scanned them before.              Please read over the following fact sheets you were given: IF YOU HAVE QUESTIONS ABOUT YOUR PRE-OP INSTRUCTIONS PLEASE CALL [phone] [facility]    If you test positive for Covid or have been in contact with anyone  that has tested positive in the last 10 days please notify you surgeon.    [name] - Preparing for Surgery Before surgery, you can play an important role.  Because skin is not sterile, your skin needs to be as free of germs as possible.  You can reduce the number of germs on your skin by washing with CHG (chlorahexidine gluconate) soap before surgery.  CHG is an antiseptic cleaner which kills germs and bonds with the skin to continue killing germs even after washing. Please DO NOT use if you have an allergy to CHG or antibacterial soaps.  If your skin becomes reddened/irritated stop using the CHG and inform your nurse when you arrive at Short Stay. Do not shave (including legs and underarms) for at least 48 hours prior to the first CHG shower.  You may shave your face/neck. Please follow these instructions carefully:  1.  Shower with CHG Soap the night before surgery and the  morning of Surgery.  2.  If you choose to wash your hair, wash your hair first as usual with your  normal  shampoo.  3.  After you shampoo, rinse your hair and body thoroughly to remove the  shampoo.                           4.  Use CHG as you would any other liquid soap.  You can apply  chg directly  to the skin and wash                       Gently with a scrungie or clean washcloth.  5.  Apply the CHG Soap to your body ONLY FROM THE NECK DOWN.   Do not use on face/ open                           Wound or open sores. Avoid contact with eyes, ears mouth and genitals (private parts).                       Wash face,  Genitals (private parts) with your normal soap.             6.  Wash thoroughly, paying special attention to the area where your surgery  will be performed.  7.  Thoroughly rinse your body with warm water  from the neck down.  8.  DO NOT shower/wash with your normal soap after using and rinsing off  the CHG Soap.                9.  Pat yourself dry with a clean towel.            10.  Wear clean pajamas.             11.  Place clean sheets on your bed the night of your first shower and do not  sleep with pets. Day of Surgery : Do not apply any lotions/deodorants the morning of surgery.  Please wear clean clothes to the [facility]/surgery center.  FAILURE TO FOLLOW THESE INSTRUCTIONS MAY RESULT IN THE CANCELLATION OF YOUR SURGERY PATIENT SIGNATURE_________________________________  NURSE SIGNATURE__________________________________  ________________________________________________________________________  [facility]  An incentive spirometer is a tool that can help keep your lungs clear and active. This tool measures how well you are filling your lungs with each breath. Taking long deep breaths may help reverse or decrease the chance of developing breathing (pulmonary) problems (especially infection) following: A long period of time when you are unable to move or be active. BEFORE THE PROCEDURE  If the spirometer includes an indicator to show your best effort, your nurse or respiratory therapist will set it to a desired goal. If possible, sit up straight or lean slightly forward. Try not to slouch. Hold the incentive spirometer in an upright position. INSTRUCTIONS FOR USE  Sit on the edge of your bed if possible, or sit up as far as you can in bed or on a chair. Hold the incentive spirometer in an upright position. Breathe out normally. Place the mouthpiece in your mouth and seal your lips tightly around it. Breathe in slowly and as deeply as possible, raising the piston or the ball toward the top of the column. Hold your breath for 3-5 seconds or for as long as possible. Allow the piston or ball to fall to the bottom of the column. Remove the mouthpiece from your mouth and breathe out normally. Rest for a few seconds and repeat Steps 1 through 7 at least 10 times every 1-2 hours when you are awake. Take your time and take a few normal breaths between deep breaths. The spirometer may include  an indicator to show your best effort. Use the indicator as a goal to work toward during each repetition. After each set of 10 deep breaths, practice coughing to be sure your  lungs are clear. If you have an incision (the cut made at the time of surgery), support your incision when coughing by placing a pillow or rolled up towels firmly against it. Once you are able to get out of bed, walk around indoors and cough well. You may stop using the incentive spirometer when instructed by your caregiver.  RISKS AND COMPLICATIONS Take your time so you do not get dizzy or light-headed. If you are in pain, you may need to take or ask for pain medication before doing incentive spirometry. It is harder to take a deep breath if you are having pain. AFTER USE Rest and breathe slowly and easily. It can be helpful to keep track of a log of your progress. Your caregiver can provide you with a simple table to help with this. If you are using the spirometer at home, follow these instructions: SEEK MEDICAL CARE IF:  You are having difficultly using the spirometer. You have trouble using the spirometer as often as instructed. Your pain medication is not giving enough relief while using the spirometer. You develop fever of 100.5 F (38.1 C) or higher. SEEK IMMEDIATE MEDICAL CARE IF:  You cough up bloody sputum that had not been present before. You develop fever of 102 F (38.9 C) or greater. You develop worsening pain at or near the incision site. MAKE SURE YOU:  Understand these instructions. Will watch your condition. Will get help right away if you are not doing well or get worse. Document Released: 07/13/2006 Document Revised: 05/25/2011 Document Reviewed: 09/13/2006 ExitCare Patient Information 2014 [employer], [place].   ________________________________________________________________________ WHAT IS A BLOOD TRANSFUSION? Blood Transfusion Information  A transfusion is the replacement of blood or some of  its parts. Blood is made up of multiple cells which provide different functions. Red blood cells carry oxygen and are used for blood loss replacement. White blood cells fight against infection. Platelets control bleeding. Plasma helps clot blood. Other blood products are available for specialized needs, such as hemophilia or other clotting disorders. BEFORE THE TRANSFUSION  Who gives blood for transfusions?  Healthy volunteers who are fully evaluated to make sure their blood is safe. This is blood bank blood. Transfusion therapy is the safest it has ever been in the practice of medicine. Before blood is taken from a donor, a complete history is taken to make sure that person has no history of diseases nor engages in risky social behavior (examples are intravenous drug use or sexual activity with multiple partners). The donor's travel history is screened to minimize risk of transmitting infections, such as malaria. The donated blood is tested for signs of infectious diseases, such as HIV and hepatitis. The blood is then tested to be sure it is compatible with you in order to minimize the chance of a transfusion reaction. If you or a relative donates blood, this is often done in anticipation of surgery and is not appropriate for emergency situations. It takes many days to process the donated blood. RISKS AND COMPLICATIONS Although transfusion therapy is very safe and saves many lives, the main dangers of transfusion include:  Getting an infectious disease. Developing a transfusion reaction. This is an allergic reaction to something in the blood you were given. Every precaution is taken to prevent this. The decision to have a blood transfusion has been considered carefully by your caregiver before blood is given. Blood is not given unless the benefits outweigh the risks. AFTER THE TRANSFUSION Right after receiving a blood transfusion, you will  usually feel much better and more energetic. This is  especially true if your red blood cells have gotten low (anemic). The transfusion raises the level of the red blood cells which carry oxygen, and this usually causes an energy increase. The nurse administering the transfusion will monitor you carefully for complications. HOME CARE INSTRUCTIONS  No special instructions are needed after a transfusion. You may find your energy is better. Speak with your caregiver about any limitations on activity for underlying diseases you may have. SEEK MEDICAL CARE IF:  Your condition is not improving after your transfusion. You develop redness or irritation at the intravenous (IV) site. SEEK IMMEDIATE MEDICAL CARE IF:  Any of the following symptoms occur over the next 12 hours: Shaking chills. You have a temperature by mouth above 102 F (38.9 C), not controlled by medicine. Chest, back, or muscle pain. People around you feel you are not acting correctly or are confused. Shortness of breath or difficulty breathing. Dizziness and fainting. You get a rash or develop hives. You have a decrease in urine output. Your urine turns a dark color or changes to pink, red, or brown. Any of the following symptoms occur over the next 10 days: You have a temperature by mouth above 102 F (38.9 C), not controlled by medicine. Shortness of breath. Weakness after normal activity. The white part of the eye turns yellow (jaundice). You have a decrease in the amount of urine or are urinating less often. Your urine turns a dark color or changes to pink, red, or brown. Document Released: 02/28/2000 Document Revised: 05/25/2011 Document Reviewed: 10/17/2007 [facility] Patient Information 2014 [place], [place].  _______________________________________________________________________

## 2023-03-15 ENCOUNTER — Telehealth: Payer: Self-pay | Admitting: Gastroenterology

## 2023-03-15 NOTE — Telephone Encounter (Signed)
Inbound call from patient stating he is having lower abdominal pain. Requesting a call back to discuss further. Please advise, thank you.

## 2023-03-16 ENCOUNTER — Other Ambulatory Visit: Payer: Self-pay

## 2023-03-16 ENCOUNTER — Encounter (HOSPITAL_COMMUNITY): Payer: Self-pay

## 2023-03-16 ENCOUNTER — Inpatient Hospital Stay (HOSPITAL_COMMUNITY)
Admission: EM | Admit: 2023-03-16 | Discharge: 2023-03-20 | DRG: 391 | Disposition: A | Discharge status: Home / Self Care | Payer: 59 | Attending: Surgery | Admitting: Surgery

## 2023-03-16 ENCOUNTER — Emergency Department (HOSPITAL_COMMUNITY): Payer: 59

## 2023-03-16 DIAGNOSIS — Z833 Family history of diabetes mellitus: Secondary | ICD-10-CM

## 2023-03-16 DIAGNOSIS — J309 Allergic rhinitis, unspecified: Secondary | ICD-10-CM | POA: Diagnosis present

## 2023-03-16 DIAGNOSIS — K219 Gastro-esophageal reflux disease without esophagitis: Secondary | ICD-10-CM | POA: Diagnosis not present

## 2023-03-16 DIAGNOSIS — Z83438 Family history of other disorder of lipoprotein metabolism and other lipidemia: Secondary | ICD-10-CM | POA: Diagnosis not present

## 2023-03-16 DIAGNOSIS — K651 Peritoneal abscess: Secondary | ICD-10-CM | POA: Diagnosis not present

## 2023-03-16 DIAGNOSIS — Z800 Family history of malignant neoplasm of digestive organs: Secondary | ICD-10-CM

## 2023-03-16 DIAGNOSIS — Z79899 Other long term (current) drug therapy: Secondary | ICD-10-CM

## 2023-03-16 DIAGNOSIS — R7303 Prediabetes: Secondary | ICD-10-CM | POA: Diagnosis not present

## 2023-03-16 DIAGNOSIS — Z803 Family history of malignant neoplasm of breast: Secondary | ICD-10-CM

## 2023-03-16 DIAGNOSIS — E785 Hyperlipidemia, unspecified: Secondary | ICD-10-CM | POA: Diagnosis not present

## 2023-03-16 DIAGNOSIS — K567 Ileus, unspecified: Secondary | ICD-10-CM | POA: Diagnosis not present

## 2023-03-16 DIAGNOSIS — Z809 Family history of malignant neoplasm, unspecified: Secondary | ICD-10-CM | POA: Diagnosis not present

## 2023-03-16 DIAGNOSIS — Z860100 Personal history of colon polyps, unspecified: Secondary | ICD-10-CM | POA: Diagnosis not present

## 2023-03-16 DIAGNOSIS — Z531 Procedure and treatment not carried out because of patient's decision for reasons of belief and group pressure: Secondary | ICD-10-CM

## 2023-03-16 DIAGNOSIS — Z83719 Family history of colon polyps, unspecified: Secondary | ICD-10-CM

## 2023-03-16 DIAGNOSIS — K769 Liver disease, unspecified: Secondary | ICD-10-CM | POA: Diagnosis not present

## 2023-03-16 DIAGNOSIS — Z825 Family history of asthma and other chronic lower respiratory diseases: Secondary | ICD-10-CM | POA: Diagnosis not present

## 2023-03-16 DIAGNOSIS — R1032 Left lower quadrant pain: Secondary | ICD-10-CM | POA: Diagnosis not present

## 2023-03-16 DIAGNOSIS — R0683 Snoring: Secondary | ICD-10-CM | POA: Diagnosis present

## 2023-03-16 DIAGNOSIS — Z832 Family history of diseases of the blood and blood-forming organs and certain disorders involving the immune mechanism: Secondary | ICD-10-CM

## 2023-03-16 DIAGNOSIS — K5720 Diverticulitis of large intestine with perforation and abscess without bleeding: Secondary | ICD-10-CM | POA: Diagnosis not present

## 2023-03-16 DIAGNOSIS — Z87891 Personal history of nicotine dependence: Secondary | ICD-10-CM

## 2023-03-16 DIAGNOSIS — Z807 Family history of other malignant neoplasms of lymphoid, hematopoietic and related tissues: Secondary | ICD-10-CM

## 2023-03-16 DIAGNOSIS — R188 Other ascites: Secondary | ICD-10-CM | POA: Diagnosis not present

## 2023-03-16 DIAGNOSIS — E876 Hypokalemia: Secondary | ICD-10-CM | POA: Diagnosis not present

## 2023-03-16 DIAGNOSIS — R12 Heartburn: Secondary | ICD-10-CM | POA: Insufficient documentation

## 2023-03-16 DIAGNOSIS — K5730 Diverticulosis of large intestine without perforation or abscess without bleeding: Secondary | ICD-10-CM | POA: Diagnosis not present

## 2023-03-16 DIAGNOSIS — Z8249 Family history of ischemic heart disease and other diseases of the circulatory system: Secondary | ICD-10-CM | POA: Diagnosis not present

## 2023-03-16 LAB — URINALYSIS, ROUTINE W REFLEX MICROSCOPIC
Bacteria, UA: NONE SEEN
Bilirubin Urine: NEGATIVE
Glucose, UA: NEGATIVE mg/dL
Hgb urine dipstick: NEGATIVE
Ketones, ur: NEGATIVE mg/dL
Leukocytes,Ua: NEGATIVE
Nitrite: NEGATIVE
Protein, ur: NEGATIVE mg/dL
Specific Gravity, Urine: 1.016 (ref 1.005–1.030)
pH: 6 (ref 5.0–8.0)

## 2023-03-16 LAB — CBC
HCT: 42.7 % (ref 39.0–52.0)
Hemoglobin: 13.3 g/dL (ref 13.0–17.0)
MCH: 27.1 pg (ref 26.0–34.0)
MCHC: 31.1 g/dL (ref 30.0–36.0)
MCV: 87.1 fL (ref 80.0–100.0)
Platelets: 230 10*3/uL (ref 150–400)
RBC: 4.9 MIL/uL (ref 4.22–5.81)
RDW: 16.5 % — ABNORMAL HIGH (ref 11.5–15.5)
WBC: 10.1 10*3/uL (ref 4.0–10.5)
nRBC: 0 % (ref 0.0–0.2)

## 2023-03-16 LAB — LIPASE, BLOOD: Lipase: 22 U/L (ref 11–51)

## 2023-03-16 LAB — COMPREHENSIVE METABOLIC PANEL
ALT: 13 U/L (ref 0–44)
AST: 13 U/L — ABNORMAL LOW (ref 15–41)
Albumin: 3.5 g/dL (ref 3.5–5.0)
Alkaline Phosphatase: 68 U/L (ref 38–126)
Anion gap: 5 (ref 5–15)
BUN: 12 mg/dL (ref 8–23)
CO2: 28 mmol/L (ref 22–32)
Calcium: 8.5 mg/dL — ABNORMAL LOW (ref 8.9–10.3)
Chloride: 104 mmol/L (ref 98–111)
Creatinine, Ser: 0.86 mg/dL (ref 0.61–1.24)
GFR, Estimated: >60 mL/min (ref >60)
Glucose, Bld: 90 mg/dL (ref 70–99)
Potassium: 3.6 mmol/L (ref 3.5–5.1)
Sodium: 137 mmol/L (ref 135–145)
Total Bilirubin: 0.8 mg/dL (ref 0.0–1.2)
Total Protein: 6.6 g/dL (ref 6.5–8.1)

## 2023-03-16 LAB — I-STAT CG4 LACTIC ACID, ED
Lactic Acid, Venous: 0.5 mmol/L (ref 0.5–1.9)
Lactic Acid, Venous: 0.8 mmol/L (ref 0.5–1.9)

## 2023-03-16 MED ORDER — CEFTRIAXONE SODIUM 2 G IJ SOLR
2.0000 g | INTRAMUSCULAR | Status: DC
  Administered 2023-03-16 – 2023-03-17 (×2): 2 g via INTRAVENOUS
  Filled 2023-03-16 (×3): qty 20

## 2023-03-16 MED ORDER — ONDANSETRON 4 MG PO TBDP: 4.0000 mg | ORAL_TABLET | Freq: Four times a day (QID) | ORAL | Status: DC | PRN

## 2023-03-16 MED ORDER — HYDROMORPHONE HCL 1 MG/ML IJ SOLN
1.0000 mg | Freq: Once | INTRAMUSCULAR | Status: AC
  Administered 2023-03-16: 1 mg via INTRAVENOUS
  Filled 2023-03-16: qty 1

## 2023-03-16 MED ORDER — METRONIDAZOLE 500 MG/100ML IV SOLN
500.0000 mg | Freq: Two times a day (BID) | INTRAVENOUS | Status: DC
  Administered 2023-03-16 – 2023-03-18 (×4): 500 mg via INTRAVENOUS
  Filled 2023-03-16 (×5): qty 100

## 2023-03-16 MED ORDER — ENOXAPARIN SODIUM 40 MG/0.4ML IJ SOSY
40.0000 mg | PREFILLED_SYRINGE | INTRAMUSCULAR | Status: DC
  Administered 2023-03-16 – 2023-03-19 (×4): 40 mg via SUBCUTANEOUS
  Filled 2023-03-16 (×4): qty 0.4

## 2023-03-16 MED ORDER — DIPHENHYDRAMINE HCL 12.5 MG/5ML PO ELIX: 12.5000 mg | ORAL_SOLUTION | Freq: Four times a day (QID) | ORAL | Status: DC | PRN

## 2023-03-16 MED ORDER — DIPHENHYDRAMINE HCL 50 MG/ML IJ SOLN: 12.5000 mg | Freq: Four times a day (QID) | INTRAMUSCULAR | Status: DC | PRN

## 2023-03-16 MED ORDER — HYDROMORPHONE HCL 1 MG/ML IJ SOLN
0.5000 mg | Freq: Once | INTRAMUSCULAR | Status: AC
  Administered 2023-03-16: 0.5 mg via INTRAVENOUS
  Filled 2023-03-16: qty 1

## 2023-03-16 MED ORDER — OXYCODONE HCL 5 MG PO TABS
5.0000 mg | ORAL_TABLET | ORAL | Status: DC | PRN
Start: 2023-03-16 — End: 2023-03-18
  Administered 2023-03-16: 10 mg via ORAL
  Administered 2023-03-17: 5 mg via ORAL
  Filled 2023-03-16: qty 2
  Filled 2023-03-16: qty 1
  Filled 2023-03-16: qty 2

## 2023-03-16 MED ORDER — ACETAMINOPHEN 500 MG PO TABS
1000.0000 mg | ORAL_TABLET | Freq: Four times a day (QID) | ORAL | Status: DC
Start: 2023-03-17 — End: 2023-03-18
  Administered 2023-03-16 – 2023-03-18 (×5): 1000 mg via ORAL
  Filled 2023-03-16 (×6): qty 2

## 2023-03-16 MED ORDER — IOHEXOL 300 MG/ML  SOLN
100.0000 mL | Freq: Once | INTRAMUSCULAR | Status: AC | PRN
  Administered 2023-03-16: 100 mL via INTRAVENOUS

## 2023-03-16 MED ORDER — ONDANSETRON HCL 4 MG/2ML IJ SOLN
4.0000 mg | Freq: Once | INTRAMUSCULAR | Status: AC
Start: 2023-03-16 — End: 2023-03-16
  Administered 2023-03-16: 4 mg via INTRAVENOUS
  Filled 2023-03-16: qty 2

## 2023-03-16 MED ORDER — ONDANSETRON HCL 4 MG/2ML IJ SOLN
4.0000 mg | Freq: Four times a day (QID) | INTRAMUSCULAR | Status: DC | PRN
  Administered 2023-03-16: 4 mg via INTRAVENOUS
  Filled 2023-03-16: qty 2

## 2023-03-16 MED ORDER — SIMETHICONE 80 MG PO CHEW: 40.0000 mg | CHEWABLE_TABLET | Freq: Four times a day (QID) | ORAL | Status: DC | PRN

## 2023-03-16 MED ORDER — MORPHINE SULFATE (PF) 2 MG/ML IV SOLN
2.0000 mg | INTRAVENOUS | Status: DC | PRN
  Administered 2023-03-16 – 2023-03-17 (×5): 2 mg via INTRAVENOUS
  Filled 2023-03-16 (×5): qty 1

## 2023-03-16 MED ORDER — PIPERACILLIN-TAZOBACTAM 3.375 G IVPB 30 MIN
3.3750 g | Freq: Once | INTRAVENOUS | Status: AC
  Administered 2023-03-16: 3.375 g via INTRAVENOUS
  Filled 2023-03-16: qty 50

## 2023-03-16 MED ORDER — SENNA 8.6 MG PO TABS
1.0000 | ORAL_TABLET | Freq: Two times a day (BID) | ORAL | Status: DC
  Administered 2023-03-16 – 2023-03-18 (×4): 8.6 mg via ORAL
  Filled 2023-03-16 (×4): qty 1

## 2023-03-16 NOTE — ED Notes (Signed)
 Received pt in [facility]04/care assumed

## 2023-03-16 NOTE — ED Provider Notes (Signed)
 [place] EMERGENCY DEPARTMENT AT [facility] Provider Note   CSN: [id number] Arrival date & time: 03/16/23  0559     History  Chief Complaint  Patient presents with   Abdominal Pain    Marcus Andrews is a 62 y.o. male.  62 yo M with a chief complaints of left-sided abdominal discomfort.  Patient has a history of diverticulitis with perforation.  He said this happened to her multiple times and so he scheduled to have a surgical removal of that part of the colon.  Scheduled for a little bit over a week from now.  He developed recurrent left lower quadrant abdominal pain over the past 24 hours.  No fevers no vomiting.   Abdominal Pain      Home Medications Prior to Admission medications   Medication Sig Start Date End Date Taking? Authorizing Provider  acetaminophen  (TYLENOL ) 325 MG tablet Take 2 tablets (650 mg total) by mouth every 6 (six) hours as needed for mild pain, fever or headache. 12/09/22   Danton Reyes DASEN, MD  albuterol  (VENTOLIN  HFA) 108 (90 Base) MCG/ACT inhaler TAKE 2 PUFFS BY MOUTH EVERY 6 HOURS AS NEEDED FOR WHEEZE OR SHORTNESS OF BREATH 09/24/22   Corwin Antu, FNP  atorvastatin  (LIPITOR) 10 MG tablet Take 1 tablet (10 mg total) by mouth daily. 07/23/22   Corwin Antu, FNP  Ensure (ENSURE) Take 237 mLs by mouth daily. Or boost    [provider]  glycopyrrolate  (ROBINUL ) 1 MG tablet Take 1 tablet (1 mg total) by mouth 2 (two) times daily. 04/21/22   Aneita Gwendlyn DASEN, MD  ibuprofen  (ADVIL ) 600 MG tablet Take 1 tablet (600 mg total) by mouth every 8 (eight) hours as needed. Patient not taking: Reported on 03/08/2023 08/26/22   Dugal, Tabitha, FNP  Magnesium  250 MG TABS Take 250 mg by mouth in the morning.    [provider]  omeprazole  (PRILOSEC) 40 MG capsule TAKE 1 CAPSULE (40 MG TOTAL) BY MOUTH DAILY. 02/02/23   Aneita Gwendlyn DASEN, MD  traMADol  (ULTRAM ) 50 MG tablet Take 50 mg by mouth daily as needed (for knee pain). 06/11/20    [provider]      Allergies    Patient has no known allergies.    Review of Systems   Review of Systems  Gastrointestinal:  Positive for abdominal pain.    Physical Exam Updated Vital Signs BP 138/88   Pulse 72   Temp 98.3 F (36.8 C)   Resp 18   Ht 6' (1.829 m)   Wt 78.9 kg   SpO2 100%   BMI 23.60 kg/m  Physical Exam Vitals and nursing note reviewed.  Constitutional:      Appearance: He is well-developed.  HENT:     Head: Normocephalic and atraumatic.  Eyes:     Pupils: Pupils are equal, round, and reactive to light.  Neck:     Vascular: No JVD.  Cardiovascular:     Rate and Rhythm: Normal rate and regular rhythm.     Heart sounds: No murmur heard.    No friction rub. No gallop.  Pulmonary:     Effort: No respiratory distress.     Breath sounds: No wheezing.  Abdominal:     General: There is no distension.     Tenderness: There is abdominal tenderness. There is no guarding or rebound.     Comments: Mild pain to the left lower quadrant.  Musculoskeletal:        General:  Normal range of motion.     Cervical back: Normal range of motion and neck supple.  Skin:    Coloration: Skin is not pale.     Findings: No rash.  Neurological:     Mental Status: He is alert and oriented to person, place, and time.  Psychiatric:        Behavior: Behavior normal.     ED Results / Procedures / Treatments   Labs (all labs ordered are listed, but only abnormal results are displayed) Labs Reviewed  COMPREHENSIVE METABOLIC PANEL - Abnormal; Notable for the following components:      Result Value   Calcium  8.5 (*)    AST 13 (*)    All other components within normal limits  CBC - Abnormal; Notable for the following components:   RDW 16.5 (*)    All other components within normal limits  CULTURE, BLOOD (ROUTINE X 2)  CULTURE, BLOOD (ROUTINE X 2)  LIPASE, BLOOD  URINALYSIS, ROUTINE W REFLEX MICROSCOPIC  I-STAT CG4 LACTIC ACID, ED     EKG None  Radiology CT ABDOMEN PELVIS W CONTRAST Result Date: 03/16/2023 CLINICAL DATA:  Left lower quadrant pain EXAM: CT ABDOMEN AND PELVIS WITH CONTRAST TECHNIQUE: Multidetector CT imaging of the abdomen and pelvis was performed using the standard protocol following bolus administration of intravenous contrast. RADIATION DOSE REDUCTION: This exam was performed according to the departmental dose-optimization program which includes automated exposure control, adjustment of the mA and/or kV according to patient size and/or use of iterative reconstruction technique. CONTRAST:  OMNIPAQUE  IOHEXOL  300 MG/ML  SOLN COMPARISON:  CT 12/07/2022 and older. FINDINGS: Lower chest: There is some linear opacity lung bases bilaterally likely scar or atelectasis, similar to previous. There is also some emphysematous changes identified at the bases. No pleural effusion. Hepatobiliary: Patent portal vein. There are scattered tiny low-attenuation lesion seen in the liver which are too small to completely characterize but likely benign cystic foci and are unchanged from previous examination. Gallbladder is nondilated. Pancreas: Mild ectasia of the pancreatic duct measuring up 3 mm on coronal imaging. Similar to previous in retrospect Spleen: Normal in size without focal abnormality. Adrenals/Urinary Tract: Adrenal glands are unremarkable. No enhancing renal mass or collecting system dilatation. Bladder is nondilated but there is some wall thickening along the superior aspect of the bladder. Mild abnormal enhancement. Stomach/Bowel: There is wall thickening along the sigmoid colon with significant stranding and diverticula. There is a bilobed extraluminal fluid collection with an air-fluid level extending inferior to the sigmoid colon and abutting the superior aspect of the urinary bladder. Overall this area measures 4.6 by 3.2 by 2.5 cm. This could represent diverticulitis with a small abscess. There is also a small  mural area of air and fluid along the sigmoid colon just proximal on series 2, image 55. No associated obstruction. No widespread free air. Normal appendix in the right lower quadrant. Scattered colonic stool. The stomach and small bowel are nondilated on this non oral contrast examination. Vascular/Lymphatic: Aortic atherosclerosis. No enlarged abdominal or pelvic lymph nodes. Reproductive: Prostate is unremarkable. Other: No free intra-air. Musculoskeletal: Degenerative changes along the lower lumbar spine particularly L4-5 and L5-S1 with disc bulging and encroachment. IMPRESSION: Wall thickening and inflammatory changes along the sigmoid colon consistent with diverticulitis. There is what appears to be an extraluminal abscess extending caudal to the sigmoid colon measuring 4.6 x 3.2 cm. This is immediately adjacent to the dome of the bladder with some bladder wall thickening and adjacent stranding.  Additional smaller intramural abscess suggested along the course of the sigmoid colon just proximal to this. Please correlate clinical presentation and recommend follow up after treatment to confirm resolution and exclude secondary pathology. Stable mild ectasia of the pancreatic duct without focal pancreatic atrophy or obvious pancreatic mass. Electronically Signed   By: Ranell Bring M.D.   On: 03/16/2023 13:00    Procedures Procedures    Medications Ordered in ED Medications  ondansetron  (ZOFRAN ) injection 4 mg (4 mg Intravenous Given 03/16/23 1010)  HYDROmorphone  (DILAUDID ) injection 0.5 mg (0.5 mg Intravenous Given 03/16/23 1010)  iohexol  (OMNIPAQUE ) 300 MG/ML solution 100 mL (100 mLs Intravenous Contrast Given 03/16/23 1056)  HYDROmorphone  (DILAUDID ) injection 0.5 mg (0.5 mg Intravenous Given 03/16/23 1145)  HYDROmorphone  (DILAUDID ) injection 1 mg (1 mg Intravenous Given 03/16/23 1243)  piperacillin -tazobactam (ZOSYN ) IVPB 3.375 g (3.375 g Intravenous New Bag/Given 03/16/23 1331)    ED Course/  Medical Decision Making/ A&P                                 Medical Decision Making Amount and/or Complexity of Data Reviewed Labs: ordered. Radiology: ordered.  Risk Prescription drug management.   63 yo M with a chief complaints of left lower quadrant abdominal pain.  Patient with a history of perforated diverticulitis.  Has occurred so frequently that she has planned surgery to remove that area of the colon.  Developed pain over the past 12 to 24 hours.  Will obtain CT imaging.  Treat pain and nausea.  No significant electrolyte abnormalities.  LFTs and lipase are unremarkable.  No leukocytosis.  CT with concerning signs for diverticulitis with abscess.  Will discuss with gen surgery.  Case was discussed with general surgery Dr. Debby.  Patient to be admitted.  The patients results and plan were reviewed and discussed.   Any x-rays performed were independently reviewed by myself.   Differential diagnosis were considered with the presenting HPI.  Medications  ondansetron  (ZOFRAN ) injection 4 mg (4 mg Intravenous Given 03/16/23 1010)  HYDROmorphone  (DILAUDID ) injection 0.5 mg (0.5 mg Intravenous Given 03/16/23 1010)  iohexol  (OMNIPAQUE ) 300 MG/ML solution 100 mL (100 mLs Intravenous Contrast Given 03/16/23 1056)  HYDROmorphone  (DILAUDID ) injection 0.5 mg (0.5 mg Intravenous Given 03/16/23 1145)  HYDROmorphone  (DILAUDID ) injection 1 mg (1 mg Intravenous Given 03/16/23 1243)  piperacillin -tazobactam (ZOSYN ) IVPB 3.375 g (3.375 g Intravenous New Bag/Given 03/16/23 1331)    Vitals:   03/16/23 1011 03/16/23 1133 03/16/23 1145 03/16/23 1230  BP: 132/81  136/79 138/88  Pulse: 75  66 72  Resp: 14  17 18   Temp:  98.3 F (36.8 C)    TempSrc:      SpO2: 99%  97% 100%  Weight:      Height:        Final diagnoses:  Diverticulitis of large intestine with abscess without bleeding    Admission/ observation were discussed with the admitting physician, patient and/or family and  they are comfortable with the plan.          Final Clinical Impression(s) / ED Diagnoses Final diagnoses:  Diverticulitis of large intestine with abscess without bleeding    Rx / DC Orders ED Discharge Orders     None         Emil Share, DO 03/16/23 1443

## 2023-03-16 NOTE — ED Triage Notes (Signed)
Pt states that he has known diverticulitis with perforation and has surgery scheduled for 03/25/23 but pain worsened x 1 day.

## 2023-03-16 NOTE — Plan of Care (Signed)

## 2023-03-16 NOTE — H&P (Signed)
 CC: abd pain  Requesting provider: Rolan Quale  HPI: Marcus Andrews is an 62 y.o. male who is here for LLQ pain. He has a history of lower abdominal pain and known episodes of diverticulitis. Followed by Dr. Aneita with [facility] gastroenterology. Had a colonoscopy recently which noted some sigmoid narrowing but not a tight stricture. Last episode was in September. He was in the hospital on IV antibiotics for a week. Eventually stabilized but then developed worsening pain over the past 24h. CT performed in ED shows diverticulitis with abscess.  Past Medical History:  Diagnosis Date   Arthritis    Colon polyp    Diverticulosis    Refusal of blood transfusions as patient is Jehovah's Witness     Past Surgical History:  Procedure Laterality Date   COLONOSCOPY     KNEE ARTHROSCOPY WITH MEDIAL MENISECTOMY Right 02/05/2020   Procedure: Right knee arthroscopy with partial medial and lateral menisectomy, chondroplasty, partial synovectomy, removal loose body;  Surgeon: Leora Lynwood JONELLE, MD;  Location: [facility] ORS;  Service: Orthopedics;  Laterality: Right;   MENISCUS REPAIR Left 01/2014   TOOTH EXTRACTION     UPPER GASTROINTESTINAL ENDOSCOPY  05/29/2021    Family History  Problem Relation Age of Onset   Breast cancer Mother    Colon cancer Mother 75   Colon polyps Mother    Lymphoma Mother    Heart disease Father    Heart failure Father    Heart attack Father 70   Hypertension Sister    Hyperlipidemia Sister    Asthma Sister    Aneurysm Sister    Hypertension Brother    Hyperlipidemia Brother    Deep vein thrombosis Brother    Heart attack Brother        50-60   Hypertension Maternal Grandmother    Diabetes Maternal Grandmother    Hypertension Maternal Aunt    Cancer Maternal Aunt        type unknown   Hypertension Maternal Uncle    Liver cancer Maternal Uncle    Hypertension Paternal Aunt    Hypertension Paternal Uncle    Esophageal cancer Neg Hx    Rectal cancer Neg Hx     Stomach cancer Neg Hx     Social:  reports that he quit smoking about 4 years ago. His smoking use included cigarettes. He has never used smokeless tobacco. He reports that he does not currently use alcohol after a past usage of about 1.0 standard drink of alcohol per week. He reports that he does not use drugs.  Allergies: No Known Allergies  Medications: I have reviewed the patient's current medications.  Results for orders placed or performed during the hospital encounter of 03/16/23 (from the past 48 hours)  Lipase, blood     Status: None   Collection Time: 03/16/23  7:00 AM  Result Value Ref Range   Lipase 22 11 - 51 U/L    Comment: Performed at [facility], [address] [place], [place] [address]  Comprehensive metabolic panel     Status: Abnormal   Collection Time: 03/16/23  7:00 AM  Result Value Ref Range   Sodium 137 135 - 145 mmol/L   Potassium 3.6 3.5 - 5.1 mmol/L   Chloride 104 98 - 111 mmol/L   CO2 28 22 - 32 mmol/L   Glucose, Bld 90 70 - 99 mg/dL    Comment: Glucose reference range applies only to samples taken after fasting for at  least 8 hours.   BUN 12 8 - 23 mg/dL   Creatinine, Ser 9.13 0.61 - 1.24 mg/dL   Calcium  8.5 (L) 8.9 - 10.3 mg/dL   Total Protein 6.6 6.5 - 8.1 g/dL   Albumin 3.5 3.5 - 5.0 g/dL   AST 13 (L) 15 - 41 U/L   ALT 13 0 - 44 U/L   Alkaline Phosphatase 68 38 - 126 U/L   Total Bilirubin 0.8 0.0 - 1.2 mg/dL   GFR, Estimated >39 >39 mL/min    Comment: (NOTE) Calculated using the CKD-EPI Creatinine Equation (2021)    Anion gap 5 5 - 15    Comment: Performed at [facility], [place], [address], [place], [place] [address]  CBC     Status: Abnormal   Collection Time: 03/16/23  7:00 AM  Result Value Ref Range   WBC 10.1 4.0 - 10.5 K/uL   RBC 4.90 4.22 - 5.81 MIL/uL   Hemoglobin 13.3 13.0 - 17.0 g/dL   HCT 57.2 60.9 - 47.9 %   MCV 87.1 80.0 - 100.0 fL   MCH 27.1 26.0 - 34.0 pg   MCHC 31.1 30.0 - 36.0  g/dL   RDW 83.4 (H) 88.4 - 84.4 %   Platelets 230 150 - 400 K/uL   nRBC 0.0 0.0 - 0.2 %    Comment: Performed at [facility], [address], [place], [place] [address]  I-Stat CG4 Lactic Acid     Status: None   Collection Time: 03/16/23  1:28 PM  Result Value Ref Range   Lactic Acid, Venous 0.5 0.5 - 1.9 mmol/L    CT ABDOMEN PELVIS W CONTRAST Result Date: 03/16/2023 CLINICAL DATA:  Left lower quadrant pain EXAM: CT ABDOMEN AND PELVIS WITH CONTRAST TECHNIQUE: Multidetector CT imaging of the abdomen and pelvis was performed using the standard protocol following bolus administration of intravenous contrast. RADIATION DOSE REDUCTION: This exam was performed according to the departmental dose-optimization program which includes automated exposure control, adjustment of the mA and/or kV according to patient size and/or use of iterative reconstruction technique. CONTRAST:  OMNIPAQUE  IOHEXOL  300 MG/ML  SOLN COMPARISON:  CT 12/07/2022 and older. FINDINGS: Lower chest: There is some linear opacity lung bases bilaterally likely scar or atelectasis, similar to previous. There is also some emphysematous changes identified at the bases. No pleural effusion. Hepatobiliary: Patent portal vein. There are scattered tiny low-attenuation lesion seen in the liver which are too small to completely characterize but likely benign cystic foci and are unchanged from previous examination. Gallbladder is nondilated. Pancreas: Mild ectasia of the pancreatic duct measuring up 3 mm on coronal imaging. Similar to previous in retrospect Spleen: Normal in size without focal abnormality. Adrenals/Urinary Tract: Adrenal glands are unremarkable. No enhancing renal mass or collecting system dilatation. Bladder is nondilated but there is some wall thickening along the superior aspect of the bladder. Mild abnormal enhancement. Stomach/Bowel: There is wall thickening along the sigmoid colon with significant stranding  and diverticula. There is a bilobed extraluminal fluid collection with an air-fluid level extending inferior to the sigmoid colon and abutting the superior aspect of the urinary bladder. Overall this area measures 4.6 by 3.2 by 2.5 cm. This could represent diverticulitis with a small abscess. There is also a small mural area of air and fluid along the sigmoid colon just proximal on series 2, image 55. No associated obstruction. No widespread free air. Normal appendix in the right lower quadrant. Scattered colonic stool. The stomach  and small bowel are nondilated on this non oral contrast examination. Vascular/Lymphatic: Aortic atherosclerosis. No enlarged abdominal or pelvic lymph nodes. Reproductive: Prostate is unremarkable. Other: No free intra-air. Musculoskeletal: Degenerative changes along the lower lumbar spine particularly L4-5 and L5-S1 with disc bulging and encroachment. IMPRESSION: Wall thickening and inflammatory changes along the sigmoid colon consistent with diverticulitis. There is what appears to be an extraluminal abscess extending caudal to the sigmoid colon measuring 4.6 x 3.2 cm. This is immediately adjacent to the dome of the bladder with some bladder wall thickening and adjacent stranding. Additional smaller intramural abscess suggested along the course of the sigmoid colon just proximal to this. Please correlate clinical presentation and recommend follow up after treatment to confirm resolution and exclude secondary pathology. Stable mild ectasia of the pancreatic duct without focal pancreatic atrophy or obvious pancreatic mass. Electronically Signed   By: Ranell Bring M.D.   On: 03/16/2023 13:00    ROS - all of the below systems have been reviewed with the patient and positives are indicated with bold text General: chills, fever or night sweats Eyes: blurry vision or double vision ENT: epistaxis or sore throat Hematologic/Lymphatic: bleeding problems, blood clots or swollen lymph  nodes Endocrine: temperature intolerance or unexpected weight changes Resp: cough, shortness of breath, or wheezing CV: chest pain or dyspnea on exertion GI: as per HPI GU: dysuria, trouble voiding, or hematuria Neuro: TIA or stroke symptoms    PE Blood pressure 138/88, pulse 72, temperature 98.3 F (36.8 C), resp. rate 18, height 6' (1.829 m), weight 78.9 kg, SpO2 100%. Constitutional: NAD; conversant; no deformities Eyes: Moist conjunctiva; no lid lag; anicteric; PERRL Neck: Trachea midline; no thyromegaly Lungs: Normal respiratory effort CV: RRR GI: Abd TTP mid abd, LLQ MSK: Normal range of motion of extremities; no clubbing/cyanosis Psychiatric: Appropriate affect; alert and oriented x3  Results for orders placed or performed during the hospital encounter of 03/16/23 (from the past 48 hours)  Lipase, blood     Status: None   Collection Time: 03/16/23  7:00 AM  Result Value Ref Range   Lipase 22 11 - 51 U/L    Comment: Performed at [facility], [address] [place], [place] [address]  Comprehensive metabolic panel     Status: Abnormal   Collection Time: 03/16/23  7:00 AM  Result Value Ref Range   Sodium 137 135 - 145 mmol/L   Potassium 3.6 3.5 - 5.1 mmol/L   Chloride 104 98 - 111 mmol/L   CO2 28 22 - 32 mmol/L   Glucose, Bld 90 70 - 99 mg/dL    Comment: Glucose reference range applies only to samples taken after fasting for at least 8 hours.   BUN 12 8 - 23 mg/dL   Creatinine, Ser 9.13 0.61 - 1.24 mg/dL   Calcium  8.5 (L) 8.9 - 10.3 mg/dL   Total Protein 6.6 6.5 - 8.1 g/dL   Albumin 3.5 3.5 - 5.0 g/dL   AST 13 (L) 15 - 41 U/L   ALT 13 0 - 44 U/L   Alkaline Phosphatase 68 38 - 126 U/L   Total Bilirubin 0.8 0.0 - 1.2 mg/dL   GFR, Estimated >39 >39 mL/min    Comment: (NOTE) Calculated using the CKD-EPI Creatinine Equation (2021)    Anion gap 5 5 - 15    Comment: Performed at [facility], [address], [place], [place]  [address]  CBC     Status: Abnormal   Collection  Time: 03/16/23  7:00 AM  Result Value Ref Range   WBC 10.1 4.0 - 10.5 K/uL   RBC 4.90 4.22 - 5.81 MIL/uL   Hemoglobin 13.3 13.0 - 17.0 g/dL   HCT 57.2 60.9 - 47.9 %   MCV 87.1 80.0 - 100.0 fL   MCH 27.1 26.0 - 34.0 pg   MCHC 31.1 30.0 - 36.0 g/dL   RDW 83.4 (H) 88.4 - 84.4 %   Platelets 230 150 - 400 K/uL   nRBC 0.0 0.0 - 0.2 %    Comment: Performed at [facility], [address], [place], [place] [address]  I-Stat CG4 Lactic Acid     Status: None   Collection Time: 03/16/23  1:28 PM  Result Value Ref Range   Lactic Acid, Venous 0.5 0.5 - 1.9 mmol/L    CT ABDOMEN PELVIS W CONTRAST Result Date: 03/16/2023 CLINICAL DATA:  Left lower quadrant pain EXAM: CT ABDOMEN AND PELVIS WITH CONTRAST TECHNIQUE: Multidetector CT imaging of the abdomen and pelvis was performed using the standard protocol following bolus administration of intravenous contrast. RADIATION DOSE REDUCTION: This exam was performed according to the departmental dose-optimization program which includes automated exposure control, adjustment of the mA and/or kV according to patient size and/or use of iterative reconstruction technique. CONTRAST:  OMNIPAQUE  IOHEXOL  300 MG/ML  SOLN COMPARISON:  CT 12/07/2022 and older. FINDINGS: Lower chest: There is some linear opacity lung bases bilaterally likely scar or atelectasis, similar to previous. There is also some emphysematous changes identified at the bases. No pleural effusion. Hepatobiliary: Patent portal vein. There are scattered tiny low-attenuation lesion seen in the liver which are too small to completely characterize but likely benign cystic foci and are unchanged from previous examination. Gallbladder is nondilated. Pancreas: Mild ectasia of the pancreatic duct measuring up 3 mm on coronal imaging. Similar to previous in retrospect Spleen: Normal in size without focal abnormality. Adrenals/Urinary Tract: Adrenal  glands are unremarkable. No enhancing renal mass or collecting system dilatation. Bladder is nondilated but there is some wall thickening along the superior aspect of the bladder. Mild abnormal enhancement. Stomach/Bowel: There is wall thickening along the sigmoid colon with significant stranding and diverticula. There is a bilobed extraluminal fluid collection with an air-fluid level extending inferior to the sigmoid colon and abutting the superior aspect of the urinary bladder. Overall this area measures 4.6 by 3.2 by 2.5 cm. This could represent diverticulitis with a small abscess. There is also a small mural area of air and fluid along the sigmoid colon just proximal on series 2, image 55. No associated obstruction. No widespread free air. Normal appendix in the right lower quadrant. Scattered colonic stool. The stomach and small bowel are nondilated on this non oral contrast examination. Vascular/Lymphatic: Aortic atherosclerosis. No enlarged abdominal or pelvic lymph nodes. Reproductive: Prostate is unremarkable. Other: No free intra-air. Musculoskeletal: Degenerative changes along the lower lumbar spine particularly L4-5 and L5-S1 with disc bulging and encroachment. IMPRESSION: Wall thickening and inflammatory changes along the sigmoid colon consistent with diverticulitis. There is what appears to be an extraluminal abscess extending caudal to the sigmoid colon measuring 4.6 x 3.2 cm. This is immediately adjacent to the dome of the bladder with some bladder wall thickening and adjacent stranding. Additional smaller intramural abscess suggested along the course of the sigmoid colon just proximal to this. Please correlate clinical presentation and recommend follow up after treatment to confirm resolution and exclude secondary pathology. Stable mild ectasia of the pancreatic duct without  focal pancreatic atrophy or obvious pancreatic mass. Electronically Signed   By: Ranell Bring M.D.   On: 03/16/2023 13:00      A/P: Marcus Andrews is an 62 y.o. male with recurrent diverticulitis with localized perforation and abscess.  Will admit for IV antibiotics and possible drain placement.   CLD Repeat labs in AM    Bernarda JAYSON Ned, MD  Colorectal and General Surgery [facility] Surgery   moderate decision making.

## 2023-03-17 ENCOUNTER — Inpatient Hospital Stay (HOSPITAL_COMMUNITY): Payer: 59

## 2023-03-17 LAB — CBC
HCT: 40.8 % (ref 39.0–52.0)
Hemoglobin: 12.8 g/dL — ABNORMAL LOW (ref 13.0–17.0)
MCH: 26.9 pg (ref 26.0–34.0)
MCHC: 31.4 g/dL (ref 30.0–36.0)
MCV: 85.7 fL (ref 80.0–100.0)
Platelets: 216 10*3/uL (ref 150–400)
RBC: 4.76 MIL/uL (ref 4.22–5.81)
RDW: 16.1 % — ABNORMAL HIGH (ref 11.5–15.5)
WBC: 8.3 10*3/uL (ref 4.0–10.5)
nRBC: 0 % (ref 0.0–0.2)

## 2023-03-17 LAB — BASIC METABOLIC PANEL
Anion gap: 7 (ref 5–15)
BUN: 11 mg/dL (ref 8–23)
CO2: 28 mmol/L (ref 22–32)
Calcium: 8.5 mg/dL — ABNORMAL LOW (ref 8.9–10.3)
Chloride: 103 mmol/L (ref 98–111)
Creatinine, Ser: 0.84 mg/dL (ref 0.61–1.24)
GFR, Estimated: >60 mL/min (ref >60)
Glucose, Bld: 87 mg/dL (ref 70–99)
Potassium: 3.3 mmol/L — ABNORMAL LOW (ref 3.5–5.1)
Sodium: 138 mmol/L (ref 135–145)

## 2023-03-17 MED ORDER — MIDAZOLAM HCL 2 MG/2ML IJ SOLN
INTRAMUSCULAR | Status: AC
  Filled 2023-03-17: qty 4

## 2023-03-17 MED ORDER — SODIUM CHLORIDE 0.9% FLUSH
5.0000 mL | Freq: Three times a day (TID) | INTRAVENOUS | Status: DC
  Administered 2023-03-17 – 2023-03-20 (×8): 5 mL

## 2023-03-17 MED ORDER — FENTANYL CITRATE (PF) 100 MCG/2ML IJ SOLN
INTRAMUSCULAR | Status: AC
  Filled 2023-03-17: qty 4

## 2023-03-17 MED ORDER — FLUMAZENIL 0.5 MG/5ML IV SOLN
INTRAVENOUS | Status: AC
  Filled 2023-03-17: qty 5

## 2023-03-17 MED ORDER — MIDAZOLAM HCL 2 MG/2ML IJ SOLN
INTRAMUSCULAR | Status: AC | PRN
  Administered 2023-03-17 (×2): .5 mg via INTRAVENOUS
  Administered 2023-03-17: 1 mg via INTRAVENOUS

## 2023-03-17 MED ORDER — NALOXONE HCL 0.4 MG/ML IJ SOLN
INTRAMUSCULAR | Status: AC
  Filled 2023-03-17: qty 1

## 2023-03-17 MED ORDER — FENTANYL CITRATE (PF) 100 MCG/2ML IJ SOLN
INTRAMUSCULAR | Status: AC | PRN
  Administered 2023-03-17: 50 ug via INTRAVENOUS
  Administered 2023-03-17 (×2): 25 ug via INTRAVENOUS
  Administered 2023-03-17: 50 ug via INTRAVENOUS

## 2023-03-17 MED ORDER — HYDROMORPHONE HCL 1 MG/ML IJ SOLN
1.0000 mg | INTRAMUSCULAR | Status: DC | PRN
  Administered 2023-03-17 – 2023-03-20 (×12): 1 mg via INTRAVENOUS
  Filled 2023-03-17 (×12): qty 1

## 2023-03-17 NOTE — Plan of Care (Signed)

## 2023-03-17 NOTE — Procedures (Signed)
 Interventional Radiology Procedure:   Indications: Colonic diverticular abscess  Procedure: CT guided drain placement  Findings: 10 Fr drain placed in anterior pelvic diverticular abscess.  Yellow purulent fluid aspirated.   Complications: None     EBL: Minimal  Plan: Follow drain output.  Send fluid for culture.  Jasyn Mey R. Philip, MD  Pager: [phone]

## 2023-03-17 NOTE — Consult Note (Signed)
 Chief Complaint: Patient was seen in consultation today for  Chief Complaint  Patient presents with   Abdominal Pain   Referring Physician(s): Dr. Bernarda Ned  Supervising Physician: [name]  Patient Status: [facility] - In-pt  History of Present Illness: Marcus Andrews is a 63 y.o. male with a medical history significant for diverticulitis with a prior hospitalization September 2024 for acute diverticulitis with microperforation. He was treated conservatively and scheduled for outpatient Surgical follow up with consideration for possible segmental colectomy. He had a colonoscopy recently which noted some sigmoid narrowing.   He presented to the ED 03/16/23 with worsening LLQ pain and imaging showed diverticulitis with abscess formation.  CT abdomen/pelvis 03/16/23 IMPRESSION: 1. Wall thickening and inflammatory changes along the sigmoid colon consistent with diverticulitis. There is what appears to be an extraluminal abscess extending caudal to the sigmoid colon measuring 4.6 x 3.2 cm. This is immediately adjacent to the dome of the bladder with some bladder wall thickening and adjacent stranding. Additional smaller intramural abscess suggested along the course of the sigmoid colon just proximal to this. Please correlate clinical presentation and recommend follow up after treatment to confirm resolution and exclude secondary pathology. 2. Stable mild ectasia of the pancreatic duct without focal pancreatic atrophy or obvious pancreatic mass.  Interventional Radiology has been consulted by Surgery for an image-guided intra-abdominal fluid collection aspiration with possible drain placement. Imaging reviewed and procedure approved by Dr. Philip.   Past Medical History:  Diagnosis Date   Arthritis    Colon polyp    Diverticulosis    Refusal of blood transfusions as patient is Jehovah's Witness     Past Surgical History:  Procedure Laterality Date   COLONOSCOPY     KNEE  ARTHROSCOPY WITH MEDIAL MENISECTOMY Right 02/05/2020   Procedure: Right knee arthroscopy with partial medial and lateral menisectomy, chondroplasty, partial synovectomy, removal loose body;  Surgeon: Leora Lynwood JONELLE, MD;  Location: [facility] ORS;  Service: Orthopedics;  Laterality: Right;   MENISCUS REPAIR Left 01/2014   TOOTH EXTRACTION     UPPER GASTROINTESTINAL ENDOSCOPY  05/29/2021    Allergies: Patient has no known allergies.  Medications: Prior to Admission medications   Medication Sig Start Date End Date Taking? Authorizing Provider  acetaminophen  (TYLENOL ) 325 MG tablet Take 2 tablets (650 mg total) by mouth every 6 (six) hours as needed for mild pain, fever or headache. 12/09/22  Yes Danton Reyes DASEN, MD  albuterol  (VENTOLIN  HFA) 108 (90 Base) MCG/ACT inhaler TAKE 2 PUFFS BY MOUTH EVERY 6 HOURS AS NEEDED FOR WHEEZE OR SHORTNESS OF BREATH Patient taking differently: Inhale 2 puffs into the lungs every 6 (six) hours as needed for shortness of breath or wheezing. 09/24/22  Yes Dugal, Tabitha, FNP  Ensure (ENSURE) Take 237 mLs by mouth daily as needed (for supplementation- or, Boost). Or boost   Yes [provider]  glycopyrrolate  (ROBINUL ) 1 MG tablet Take 1 tablet (1 mg total) by mouth 2 (two) times daily. 04/21/22  Yes Aneita Gwendlyn DASEN, MD  lactose free nutrition (BOOST) LIQD Take 237 mLs by mouth daily as needed (for supplementation- or, Ensure).   Yes [provider]  Magnesium  250 MG TABS Take 250 mg by mouth in the morning.   Yes [provider]  omeprazole  (PRILOSEC) 40 MG capsule TAKE 1 CAPSULE (40 MG TOTAL) BY MOUTH DAILY. Patient taking differently: Take 40 mg by mouth daily before breakfast. 02/02/23  Yes Aneita Gwendlyn DASEN, MD  traMADol  (ULTRAM ) 50 MG tablet  Take 50 mg by mouth every 6 (six) hours as needed (for pain). 06/11/20  Yes [provider]  atorvastatin  (LIPITOR) 10 MG tablet Take 1 tablet (10 mg total) by mouth daily. Patient not taking:  Reported on 03/16/2023 07/23/22   Dugal, Tabitha, FNP  ibuprofen  (ADVIL ) 600 MG tablet Take 1 tablet (600 mg total) by mouth every 8 (eight) hours as needed. Patient not taking: Reported on 03/08/2023 08/26/22   Corwin Antu, FNP     Family History  Problem Relation Age of Onset   Breast cancer Mother    Colon cancer Mother 14   Colon polyps Mother    Lymphoma Mother    Heart disease Father    Heart failure Father    Heart attack Father 55   Hypertension Sister    Hyperlipidemia Sister    Asthma Sister    Aneurysm Sister    Hypertension Brother    Hyperlipidemia Brother    Deep vein thrombosis Brother    Heart attack Brother        50-60   Hypertension Maternal Grandmother    Diabetes Maternal Grandmother    Hypertension Maternal Aunt    Cancer Maternal Aunt        type unknown   Hypertension Maternal Uncle    Liver cancer Maternal Uncle    Hypertension Paternal Aunt    Hypertension Paternal Uncle    Esophageal cancer Neg Hx    Rectal cancer Neg Hx    Stomach cancer Neg Hx     Social History   Socioeconomic History   Marital status: Single    Spouse name: Not on file   Number of children: 1   Years of education: Not on file   Highest education level: Not on file  Occupational History   Occupation: owner/operator  Tobacco Use   Smoking status: Former    Current packs/day: 0.00    Types: Cigarettes    Quit date: 05/28/2018    Years since quitting: 4.8   Smokeless tobacco: Never  Vaping Use   Vaping status: Never Used  Substance and Sexual Activity   Alcohol use: Not Currently    Alcohol/week: 1.0 standard drink of alcohol    Types: 1 Cans of beer per week    Comment: a few times a year   Drug use: Never   Sexual activity: Not Currently    Birth control/protection: Condom  Other Topics Concern   Not on file  Social History Narrative   05/22/21   From: the area   Living: alone   Work: teacher, adult education      Family: family is all over  the place, has cousins which are local in [place]      Enjoys: learning to play guitar, DJ, music, counsellor      Exercise: knee/back pain limit this   Diet: could be better - avoids red meats/pork      Safety   Seat belts: Yes    Guns: No   Safe in relationships: Yes       Daughter: 51   Social Drivers of Corporate Investment Banker Strain: Not on file  Food Insecurity: No Food Insecurity (03/16/2023)   Hunger Vital Sign    Worried About Running Out of Food in the Last Year: Never true    Ran Out of Food in the Last Year: Never true  Transportation Needs: No Transportation Needs (03/16/2023)   PRAPARE - Administrator, Civil Service (Medical):  No    Lack of Transportation (Non-Medical): No  Physical Activity: Not on file  Stress: Not on file  Social Connections: Not on file    Review of Systems: A 12 point ROS discussed and pertinent positives are indicated in the HPI above.  All other systems are negative.  Review of Systems  Constitutional:  Positive for appetite change.  Respiratory:  Negative for cough and shortness of breath.   Cardiovascular:  Negative for chest pain and leg swelling.  Gastrointestinal:  Positive for abdominal pain. Negative for diarrhea, nausea and vomiting.  Neurological:  Positive for headaches.    Vital Signs: BP 132/81   Pulse 62   Temp 98.2 F (36.8 C) (Oral)   Resp 18   Ht 6' (1.829 m)   Wt 174 lb (78.9 kg)   SpO2 100%   BMI 23.60 kg/m   Physical Exam Constitutional:      General: He is not in acute distress.    Appearance: He is not ill-appearing.  HENT:     Mouth/Throat:     Mouth: Mucous membranes are moist.     Pharynx: Oropharynx is clear.  Cardiovascular:     Rate and Rhythm: Normal rate.  Pulmonary:     Effort: Pulmonary effort is normal.  Abdominal:     Palpations: Abdomen is soft.     Tenderness: There is abdominal tenderness.  Skin:    General: Skin is warm and dry.  Neurological:     Mental  Status: He is alert and oriented to person, place, and time.  Psychiatric:        Mood and Affect: Mood normal.        Behavior: Behavior normal.        Thought Content: Thought content normal.        Judgment: Judgment normal.     Imaging: CT ABDOMEN PELVIS W CONTRAST Result Date: 03/16/2023 CLINICAL DATA:  Left lower quadrant pain EXAM: CT ABDOMEN AND PELVIS WITH CONTRAST TECHNIQUE: Multidetector CT imaging of the abdomen and pelvis was performed using the standard protocol following bolus administration of intravenous contrast. RADIATION DOSE REDUCTION: This exam was performed according to the departmental dose-optimization program which includes automated exposure control, adjustment of the mA and/or kV according to patient size and/or use of iterative reconstruction technique. CONTRAST:  OMNIPAQUE  IOHEXOL  300 MG/ML  SOLN COMPARISON:  CT 12/07/2022 and older. FINDINGS: Lower chest: There is some linear opacity lung bases bilaterally likely scar or atelectasis, similar to previous. There is also some emphysematous changes identified at the bases. No pleural effusion. Hepatobiliary: Patent portal vein. There are scattered tiny low-attenuation lesion seen in the liver which are too small to completely characterize but likely benign cystic foci and are unchanged from previous examination. Gallbladder is nondilated. Pancreas: Mild ectasia of the pancreatic duct measuring up 3 mm on coronal imaging. Similar to previous in retrospect Spleen: Normal in size without focal abnormality. Adrenals/Urinary Tract: Adrenal glands are unremarkable. No enhancing renal mass or collecting system dilatation. Bladder is nondilated but there is some wall thickening along the superior aspect of the bladder. Mild abnormal enhancement. Stomach/Bowel: There is wall thickening along the sigmoid colon with significant stranding and diverticula. There is a bilobed extraluminal fluid collection with an air-fluid level  extending inferior to the sigmoid colon and abutting the superior aspect of the urinary bladder. Overall this area measures 4.6 by 3.2 by 2.5 cm. This could represent diverticulitis with a small abscess. There is also a small  mural area of air and fluid along the sigmoid colon just proximal on series 2, image 55. No associated obstruction. No widespread free air. Normal appendix in the right lower quadrant. Scattered colonic stool. The stomach and small bowel are nondilated on this non oral contrast examination. Vascular/Lymphatic: Aortic atherosclerosis. No enlarged abdominal or pelvic lymph nodes. Reproductive: Prostate is unremarkable. Other: No free intra-air. Musculoskeletal: Degenerative changes along the lower lumbar spine particularly L4-5 and L5-S1 with disc bulging and encroachment. IMPRESSION: Wall thickening and inflammatory changes along the sigmoid colon consistent with diverticulitis. There is what appears to be an extraluminal abscess extending caudal to the sigmoid colon measuring 4.6 x 3.2 cm. This is immediately adjacent to the dome of the bladder with some bladder wall thickening and adjacent stranding. Additional smaller intramural abscess suggested along the course of the sigmoid colon just proximal to this. Please correlate clinical presentation and recommend follow up after treatment to confirm resolution and exclude secondary pathology. Stable mild ectasia of the pancreatic duct without focal pancreatic atrophy or obvious pancreatic mass. Electronically Signed   By: Ranell Bring M.D.   On: 03/16/2023 13:00    Labs:  CBC: Recent Labs    12/05/22 0415 12/07/22 1351 03/16/23 0700 03/17/23 0434  WBC 7.5 8.9 10.1 8.3  HGB 12.9* 13.3 13.3 12.8*  HCT 40.4 42.3 42.7 40.8  PLT 274 322 230 216    COAGS: No results for input(s): INR, APTT in the last 8760 hours.  BMP: Recent Labs    12/05/22 0415 12/08/22 0259 03/16/23 0700 03/17/23 0434  NA 136 141 137 138  K 4.1 3.6  3.6 3.3*  CL 106 105 104 103  CO2 25 26 28 28   GLUCOSE 113* 100* 90 87  BUN 6 5* 12 11  CALCIUM  8.6* 8.9 8.5* 8.5*  CREATININE 0.96 1.10 0.86 0.84  GFRNONAA >60 >60 >60 >60    LIVER FUNCTION TESTS: Recent Labs    12/01/22 1316 12/02/22 0306 03/16/23 0700  BILITOT 1.2 1.0 0.8  AST 15 11* 13*  ALT 15 12 13   ALKPHOS 84 68 68  PROT 7.4 5.8* 6.6  ALBUMIN 4.0 2.9* 3.5    TUMOR MARKERS: No results for input(s): AFPTM, CEA, CA199, CHROMGRNA in the last 8760 hours.  Assessment and Plan:  Perforated sigmoid diverticulitis with abscess: Marcus Andrews, 63 year old male, is scheduled today for an image-guided intra-abdominal fluid collection aspiration with possible drain placement.   Risks and benefits discussed with the patient including bleeding, infection, damage to adjacent structures, bowel perforation/fistula connection, and sepsis.  All of the patient's questions were answered, patient is agreeable to proceed. He has been NPO. Last dose of lovenox  was 03/16/23 at 2118.   Consent signed and in chart.  Thank you for this interesting consult.  I greatly enjoyed meeting Marcus Andrews and look forward to participating in their care.  A copy of this report was sent to the requesting provider on this date.  Electronically Signed: Warren Dais, AGACNP-BC 03/17/2023, 11:16 AM   I spent a total of 20 Minutes    in face to face in clinical consultation, greater than 50% of which was counseling/coordinating care for diverticular abscess drain.

## 2023-03-17 NOTE — Progress Notes (Signed)
 Diverticulitis of colon with perforation  Subjective: Feels about the same this am, denies any nausea  Objective: Vital signs in last 24 hours: Temp:  [98.1 F (36.7 C)-98.8 F (37.1 C)] 98.4 F (36.9 C) (01/01 0334) Pulse Rate:  [65-75] 72 (01/01 0334) Resp:  [14-18] 16 (01/01 0334) BP: (126-153)/(79-95) 126/83 (01/01 0334) SpO2:  [95 %-100 %] 95 % (01/01 0334) Last BM Date : 03/15/23  Intake/Output from previous day: 12/31 0701 - 01/01 0700 In: 630 [P.O.:480; IV Piggyback:150] Out: -  Intake/Output this shift: No intake/output data recorded.  General appearance: alert and cooperative GI: soft  Lab Results:  Results for orders placed or performed during the hospital encounter of 03/16/23 (from the past 24 hours)  Blood culture (routine x 2)     Status: None (Preliminary result)   Collection Time: 03/16/23  1:21 PM   Specimen: BLOOD  Result Value Ref Range   Specimen Description      BLOOD LEFT ANTECUBITAL Performed at [facility] [address], [place], [place] [address]    Special Requests      BOTTLES DRAWN AEROBIC AND ANAEROBIC Blood Culture results may not be optimal due to an inadequate volume of blood received in culture bottles Performed at [facility], [address] [place], [place] [address]    Culture      NO GROWTH < 24 HOURS Performed at [facility], [address], [place], [place] [address]    Report Status PENDING   Urinalysis, Routine w reflex microscopic -Urine, Clean Catch     Status: None   Collection Time: 03/16/23  1:26 PM  Result Value Ref Range   Color, Urine YELLOW YELLOW   APPearance CLEAR CLEAR   Specific Gravity, Urine 1.016 1.005 - 1.030   pH 6.0 5.0 - 8.0   Glucose, UA NEGATIVE NEGATIVE mg/dL   Hgb urine dipstick NEGATIVE NEGATIVE   Bilirubin Urine NEGATIVE NEGATIVE   Ketones, ur NEGATIVE NEGATIVE mg/dL   Protein, ur NEGATIVE NEGATIVE mg/dL   Nitrite NEGATIVE NEGATIVE    Leukocytes,Ua NEGATIVE NEGATIVE   RBC / HPF 0-5 0 - 5 RBC/hpf   WBC, UA 0-5 0 - 5 WBC/hpf   Bacteria, UA NONE SEEN NONE SEEN   Squamous Epithelial / HPF 0-5 0 - 5 /HPF   Mucus PRESENT   I-Stat CG4 Lactic Acid     Status: None   Collection Time: 03/16/23  1:28 PM  Result Value Ref Range   Lactic Acid, Venous 0.5 0.5 - 1.9 mmol/L  Blood culture (routine x 2)     Status: None (Preliminary result)   Collection Time: 03/16/23  4:38 PM   Specimen: BLOOD  Result Value Ref Range   Specimen Description      BLOOD SITE NOT SPECIFIED Performed at [facility], [address], [place], [place] [address]    Special Requests      BOTTLES DRAWN AEROBIC AND ANAEROBIC Blood Culture results may not be optimal due to an inadequate volume of blood received in culture bottles Performed at [facility], [address] [place], [place] [address]    Culture      NO GROWTH < 12 HOURS Performed at [facility], [address], [place], [place] [address]    Report Status PENDING   I-Stat CG4 Lactic Acid     Status: None   Collection Time: 03/16/23  4:45 PM  Result Value Ref Range  Lactic Acid, Venous 0.8 0.5 - 1.9 mmol/L  Basic metabolic panel     Status: Abnormal   Collection Time: 03/17/23  4:34 AM  Result Value Ref Range   Sodium 138 135 - 145 mmol/L   Potassium 3.3 (L) 3.5 - 5.1 mmol/L   Chloride 103 98 - 111 mmol/L   CO2 28 22 - 32 mmol/L   Glucose, Bld 87 70 - 99 mg/dL   BUN 11 8 - 23 mg/dL   Creatinine, Ser 9.15 0.61 - 1.24 mg/dL   Calcium  8.5 (L) 8.9 - 10.3 mg/dL   GFR, Estimated >39 >39 mL/min   Anion gap 7 5 - 15  CBC     Status: Abnormal   Collection Time: 03/17/23  4:34 AM  Result Value Ref Range   WBC 8.3 4.0 - 10.5 K/uL   RBC 4.76 4.22 - 5.81 MIL/uL   Hemoglobin 12.8 (L) 13.0 - 17.0 g/dL   HCT 59.1 60.9 - 47.9 %   MCV 85.7 80.0 - 100.0 fL   MCH 26.9 26.0 - 34.0 pg   MCHC 31.4 30.0 - 36.0 g/dL   RDW 83.8 (H) 88.4 - 84.4 %    Platelets 216 150 - 400 K/uL   nRBC 0.0 0.0 - 0.2 %     Studies/Results Radiology     MEDS, Scheduled  acetaminophen   1,000 mg Oral Q6H   enoxaparin  (LOVENOX ) injection  40 mg Subcutaneous Q24H   senna  1 tablet Oral BID     Assessment: Diverticulitis of colon with perforation Appears to have an abscess between the colon and bladder  Plan: Will have IR place drain May need to postpone surgery next week Cont IV antibiotics   LOS: 1 day    Adelbert Gaspard C Bronda Alfred, MD [facility]  Patient's medical decision making was straightforward (25 mins met or exceeded with patient care and documentation).   03/17/2023 8:56 AM

## 2023-03-18 ENCOUNTER — Encounter (HOSPITAL_COMMUNITY)
Admission: RE | Admit: 2023-03-18 | Discharge: 2023-03-18 | Disposition: A | Discharge status: Home / Self Care | Payer: 59 | Source: Ambulatory Visit | Attending: Family | Admitting: Family

## 2023-03-18 ENCOUNTER — Other Ambulatory Visit: Payer: Self-pay | Admitting: Urology

## 2023-03-18 DIAGNOSIS — R12 Heartburn: Secondary | ICD-10-CM | POA: Insufficient documentation

## 2023-03-18 DIAGNOSIS — Z01818 Encounter for other preprocedural examination: Secondary | ICD-10-CM

## 2023-03-18 MED ORDER — ALBUTEROL SULFATE (2.5 MG/3ML) 0.083% IN NEBU: 2.5000 mg | INHALATION_SOLUTION | Freq: Four times a day (QID) | RESPIRATORY_TRACT | Status: DC | PRN

## 2023-03-18 MED ORDER — MAGIC MOUTHWASH: 15.0000 mL | Freq: Four times a day (QID) | ORAL | Status: DC | PRN

## 2023-03-18 MED ORDER — SODIUM CHLORIDE 0.9% FLUSH: 3.0000 mL | INTRAVENOUS | Status: DC | PRN

## 2023-03-18 MED ORDER — SALINE SPRAY 0.65 % NA SOLN: 1.0000 | Freq: Four times a day (QID) | NASAL | Status: DC | PRN

## 2023-03-18 MED ORDER — METHOCARBAMOL 500 MG PO TABS: 1000.0000 mg | ORAL_TABLET | Freq: Four times a day (QID) | ORAL | Status: DC | PRN

## 2023-03-18 MED ORDER — LACTATED RINGERS IV BOLUS: 1000.0000 mL | Freq: Three times a day (TID) | INTRAVENOUS | Status: DC | PRN

## 2023-03-18 MED ORDER — MENTHOL 3 MG MT LOZG: 1.0000 | LOZENGE | OROMUCOSAL | Status: DC | PRN

## 2023-03-18 MED ORDER — ALUM & MAG HYDROXIDE-SIMETH 200-200-20 MG/5ML PO SUSP: 30.0000 mL | Freq: Four times a day (QID) | ORAL | Status: DC | PRN

## 2023-03-18 MED ORDER — PHENOL 1.4 % MT LIQD: 2.0000 | OROMUCOSAL | Status: DC | PRN

## 2023-03-18 MED ORDER — METOPROLOL TARTRATE 5 MG/5ML IV SOLN: 5.0000 mg | Freq: Four times a day (QID) | INTRAVENOUS | Status: DC | PRN

## 2023-03-18 MED ORDER — PIPERACILLIN-TAZOBACTAM 3.375 G IVPB
3.3750 g | Freq: Three times a day (TID) | INTRAVENOUS | Status: DC
  Administered 2023-03-18 – 2023-03-20 (×6): 3.375 g via INTRAVENOUS
  Filled 2023-03-18 (×6): qty 50

## 2023-03-18 MED ORDER — PANTOPRAZOLE SODIUM 40 MG PO TBEC
80.0000 mg | DELAYED_RELEASE_TABLET | Freq: Every day | ORAL | Status: DC
  Administered 2023-03-18 – 2023-03-20 (×3): 80 mg via ORAL
  Filled 2023-03-18 (×3): qty 2

## 2023-03-18 MED ORDER — ACETAMINOPHEN 500 MG PO TABS
1000.0000 mg | ORAL_TABLET | Freq: Four times a day (QID) | ORAL | Status: DC
  Administered 2023-03-18 – 2023-03-20 (×4): 1000 mg via ORAL
  Filled 2023-03-18 (×5): qty 2

## 2023-03-18 MED ORDER — PROCHLORPERAZINE EDISYLATE 10 MG/2ML IJ SOLN
5.0000 mg | INTRAMUSCULAR | Status: DC | PRN
  Administered 2023-03-18: 5 mg via INTRAVENOUS
  Filled 2023-03-18: qty 2

## 2023-03-18 MED ORDER — ATORVASTATIN CALCIUM 10 MG PO TABS
10.0000 mg | ORAL_TABLET | Freq: Every day | ORAL | Status: DC
Start: 2023-03-18 — End: 2023-03-18

## 2023-03-18 MED ORDER — NAPHAZOLINE-GLYCERIN 0.012-0.25 % OP SOLN: 1.0000 [drp] | Freq: Four times a day (QID) | OPHTHALMIC | Status: DC | PRN

## 2023-03-18 MED ORDER — ONDANSETRON HCL 4 MG/2ML IJ SOLN
4.0000 mg | Freq: Four times a day (QID) | INTRAMUSCULAR | Status: DC | PRN
  Administered 2023-03-18: 4 mg via INTRAVENOUS
  Filled 2023-03-18: qty 2

## 2023-03-18 MED ORDER — GLYCOPYRROLATE 1 MG PO TABS
1.0000 mg | ORAL_TABLET | Freq: Two times a day (BID) | ORAL | Status: DC
  Administered 2023-03-18 – 2023-03-20 (×4): 1 mg via ORAL
  Filled 2023-03-18 (×4): qty 1

## 2023-03-18 MED ORDER — OXYCODONE HCL 5 MG PO TABS: 5.0000 mg | ORAL_TABLET | ORAL | Status: DC | PRN

## 2023-03-18 MED ORDER — MAGNESIUM OXIDE -MG SUPPLEMENT 400 (240 MG) MG PO TABS
200.0000 mg | ORAL_TABLET | Freq: Every day | ORAL | Status: DC
  Administered 2023-03-19 – 2023-03-20 (×2): 200 mg via ORAL
  Filled 2023-03-18 (×2): qty 1

## 2023-03-18 MED ORDER — SODIUM CHLORIDE 0.9% FLUSH
3.0000 mL | Freq: Two times a day (BID) | INTRAVENOUS | Status: DC
  Administered 2023-03-18 – 2023-03-19 (×4): 3 mL via INTRAVENOUS

## 2023-03-18 MED ORDER — TRAMADOL HCL 50 MG PO TABS: 50.0000 mg | ORAL_TABLET | Freq: Four times a day (QID) | ORAL | Status: DC | PRN

## 2023-03-18 MED ORDER — METHOCARBAMOL 1000 MG/10ML IJ SOLN: 1000.0000 mg | Freq: Four times a day (QID) | INTRAMUSCULAR | Status: DC | PRN

## 2023-03-18 MED ORDER — SODIUM CHLORIDE 0.9 % IV SOLN: 250.0000 mL | INTRAVENOUS | Status: DC | PRN

## 2023-03-18 NOTE — Progress Notes (Addendum)
 03/18/2023  Marcus Andrews [id number] 03-Sep-1960  CARE TEAM: PCP: Corwin Antu, FNP  Outpatient Care Team: Patient Care Team: Corwin Antu, FNP as PCP - General (Family Medicine) Aneita Gwendlyn DASEN, MD as Consulting Physician (Gastroenterology) Sheldon Standing, MD as Consulting Physician (General Surgery)  Inpatient Treatment Team: Treatment Team:  [place], Md, MD Ruthellen Ruthellen Radiology, MD Cristina Rosina LABOR, RN Sheldon Standing, MD Carolee Rosaline DASEN, RPH [place], Tawni HERO, LCSW Yetta Tomy HERO, RN Ellery Marry BRAVO, NT   Problem List:   Principal Problem:   Diverticulitis of colon with perforation Active Problems:   Prediabetes   Allergic rhinitis   Dyslipidemia   Snoring   Transfusion of blood product refused for religious reason (Jehovah witness)   Diverticulitis of large intestine with abscess   Colonic diverticular abscess   Heartburn   03/17/2023  PROCEDURE:  CT-guided placement of a drainage catheter in the pericolonic abscess.  FINDINGS: Air-fluid collection just anterior to the sigmoid colon and adjacent to the bladder in the anterior pelvis. Yellow purulent fluid aspirated from the collection. Drain is reconstituted within the collection at the end of the procedure. Inflammatory changes around the sigmoid colon compatible with acute diverticulitis.   IR Radiologist:  Juliene Balder M.D    Assessment [name] = 2 days)      Recurrent diverticulitis with abscess.  IR drainage 1/1   Slowly recovering    Plan:  On clear liquids.  Gaile to full liquids.  Possibly soft diet.  Pain control with Tylenol  and stronger options just in case.  IV antibiotics.  Will do piperacillin /tazobactam given multiple species on initial Gram stain in this a complex attack with an abscess.  Hopefully he can recover with his ileus advance diet switch to oral antibiotics with outpatient drain study and then plan robotic colectomy in about 6  weeks once the risk of infection is less and therefore the likelihood of immediate anastomosis and minimize the risk of temporary colostomy.  Patient was scheduled for robotic sigmoid colectomy next week but given this attack, most likely will have to postpone this another 6 weeks.  Tentatively rescheduled 05/12/2023.  GERD.  PPI.  Do Protonix  since that is formulary.  Hyper lipidemia controlled with Lipitor.  Watch blood pressure  Albuterol  as needed for some occasional asthma.  VTE prophylaxis- SCDs, etc  -mobilize as tolerated to help recovery  -Disposition:  Disposition:  The patient is from: Home Anticipate discharge to:  Home Anticipated Date of Discharge is:  January 4,2025   Barriers to discharge:  Pending Clinical improvement (more likely than not)  Patient currently is NOT MEDICALLY STABLE for discharge from the hospital from a surgery standpoint.      I reviewed nursing notes, ED provider notes, last 24 h vitals and pain scores, last 48 h intake and output, last 24 h labs and trends, and last 24 h imaging results.  I have reviewed this patient's available data, including medical history, events of note, test results, etc as part of my evaluation.   A significant portion of that time was spent in counseling. Care during the described time interval was provided by me.  This care required moderate level of medical decision making.  03/18/2023    Subjective: (Chief complaint)  Notified of patient's admission this morning.  Drain placed.  Still feeling rather sore below but overall.  Very tolerated liquids.  Walked in hallways.  Thinks he is passed some gas.  Objective:  Vital signs:  Vitals:  03/17/23 1300 03/17/23 1928 03/18/23 0520 03/18/23 1324  BP: (!) 133/91 127/87 127/84 (!) 141/87  Pulse: 75 69 66 (!) 56  Resp: 17 19 18 18   Temp:  98.6 F (37 C) 98.1 F (36.7 C) 98.3 F (36.8 C)  TempSrc:  Oral Oral Oral  SpO2: 100% 97% 98% 100%  Weight:       Height:        Last BM Date : 03/15/23  Intake/Output   Yesterday:  01/01 0701 - 01/02 0700 In: 210 [I.V.:5; IV Piggyback:200] Out: 243 [Urine:225; Drains:18] This shift:  No intake/output data recorded.  Bowel function:  Flatus: YES  BM:  No  Drain: Serosanguinous   Physical Exam:  General: Pt awake/alert in no acute distress Eyes: PERRL, normal EOM.  Sclera clear.  No icterus Neuro: CN II-XII intact w/o focal sensory/motor deficits. Lymph: No head/neck/groin lymphadenopathy Psych:  No delerium/psychosis/paranoia.  Oriented x 4 HENT: Normocephalic, Mucus membranes moist.  No thrush Neck: Supple, No tracheal deviation.  No obvious thyromegaly Chest: No pain to chest wall compression.  Good respiratory excursion.  No audible wheezing CV:  Pulses intact.  Regular rhythm.  No major extremity edema MS: Normal AROM mjr joints.  No obvious deformity  Abdomen: Soft.  Nondistended.  Tenderness at left lower quadrant primarily. .  No evidence of peritonitis.  No incarcerated hernias.  Ext:   No deformity.  No mjr edema.  No cyanosis Skin: No petechiae / purpurea.  No major sores.  Warm and dry    Results:   Cultures: Recent Results (from the past 720 hours)  Blood culture (routine x 2)     Status: None (Preliminary result)   Collection Time: 03/16/23  1:21 PM   Specimen: BLOOD  Result Value Ref Range Status   Specimen Description   Final    BLOOD LEFT ANTECUBITAL Performed at [facility], [address], [place], [place] [address]    Special Requests   Final    BOTTLES DRAWN AEROBIC AND ANAEROBIC Blood Culture results may not be optimal due to an inadequate volume of blood received in culture bottles Performed at [facility], [address], [place], [place] [address]    Culture   Final    NO GROWTH 2 DAYS Performed at [facility], [address], [place], [place] [address]    Report Status PENDING  Incomplete   Blood culture (routine x 2)     Status: None (Preliminary result)   Collection Time: 03/16/23  4:38 PM   Specimen: BLOOD  Result Value Ref Range Status   Specimen Description   Final    BLOOD SITE NOT SPECIFIED Performed at [facility], [address], [place], [place] [address]    Special Requests   Final    BOTTLES DRAWN AEROBIC AND ANAEROBIC Blood Culture results may not be optimal due to an inadequate volume of blood received in culture bottles Performed at [facility], [address], [place], [place] [address]    Culture   Final    NO GROWTH 2 DAYS Performed at [facility], [address] [place], [place] [address]    Report Status PENDING  Incomplete  Aerobic/Anaerobic Culture w Gram Stain (surgical/deep wound)     Status: None (Preliminary result)   Collection Time: 03/17/23 12:53 PM   Specimen: Abscess  Result Value Ref Range Status   Specimen Description   Final    ABSCESS  Performed at [facility], [address], [place], [place] [address]    Special Requests   Final    NONE Performed at [facility], [address] [place], [place] [address]    Gram Stain   Final    ABUNDANT WBC PRESENT, PREDOMINANTLY PMN RARE GRAM POSITIVE COCCI    Culture   Final    FEW STREPTOCOCCUS CONSTELLATUS RARE GRAM NEGATIVE RODS CULTURE REINCUBATED FOR BETTER GROWTH Performed at [facility], [address] [place], [place] [address]    Report Status PENDING  Incomplete    Labs: Results for orders placed or performed during the hospital encounter of 03/16/23 (from the past 48 hours)  Blood culture (routine x 2)     Status: None (Preliminary result)   Collection Time: 03/16/23  4:38 PM   Specimen: BLOOD  Result Value Ref Range   Specimen Description      BLOOD SITE NOT SPECIFIED Performed at [facility], [address] [place], [place] [address]    Special  Requests      BOTTLES DRAWN AEROBIC AND ANAEROBIC Blood Culture results may not be optimal due to an inadequate volume of blood received in culture bottles Performed at [facility], [address] [place], [place] [address]    Culture      NO GROWTH 2 DAYS Performed at [facility], [address], [place], [place] [address]    Report Status PENDING   I-Stat CG4 Lactic Acid     Status: None   Collection Time: 03/16/23  4:45 PM  Result Value Ref Range   Lactic Acid, Venous 0.8 0.5 - 1.9 mmol/L  Basic metabolic panel     Status: Abnormal   Collection Time: 03/17/23  4:34 AM  Result Value Ref Range   Sodium 138 135 - 145 mmol/L   Potassium 3.3 (L) 3.5 - 5.1 mmol/L   Chloride 103 98 - 111 mmol/L   CO2 28 22 - 32 mmol/L   Glucose, Bld 87 70 - 99 mg/dL    Comment: Glucose reference range applies only to samples taken after fasting for at least 8 hours.   BUN 11 8 - 23 mg/dL   Creatinine, Ser 9.15 0.61 - 1.24 mg/dL   Calcium  8.5 (L) 8.9 - 10.3 mg/dL   GFR, Estimated >39 >39 mL/min    Comment: (NOTE) Calculated using the CKD-EPI Creatinine Equation (2021)    Anion gap 7 5 - 15    Comment: Performed at [facility], [address] [place], [place] [address]  CBC     Status: Abnormal   Collection Time: 03/17/23  4:34 AM  Result Value Ref Range   WBC 8.3 4.0 - 10.5 K/uL   RBC 4.76 4.22 - 5.81 MIL/uL   Hemoglobin 12.8 (L) 13.0 - 17.0 g/dL   HCT 59.1 60.9 - 47.9 %   MCV 85.7 80.0 - 100.0 fL   MCH 26.9 26.0 - 34.0 pg   MCHC 31.4 30.0 - 36.0 g/dL   RDW 83.8 (H) 88.4 - 84.4 %   Platelets 216 150 - 400 K/uL   nRBC 0.0 0.0 - 0.2 %    Comment: Performed at [facility], [address], [place], [place] [address]  Aerobic/Anaerobic Culture w Gram Stain (surgical/deep wound)     Status: None (Preliminary result)   Collection Time: 03/17/23 12:53 PM   Specimen: Abscess  Result Value Ref Range  Specimen Description       ABSCESS Performed at [facility], [address] [place], [place] [address]    Special Requests      NONE Performed at [facility], [address], [place], [place] [address]    Gram Stain      ABUNDANT WBC PRESENT, PREDOMINANTLY PMN RARE GRAM POSITIVE COCCI    Culture      FEW STREPTOCOCCUS CONSTELLATUS RARE GRAM NEGATIVE RODS CULTURE REINCUBATED FOR BETTER GROWTH Performed at [facility], [address], [place], [place] [address]    Report Status PENDING     Imaging / Studies: CT GUIDED PERITONEAL/RETROPERITONEAL FLUID DRAIN BY PERC CATH Result Date: 03/17/2023 INDICATION: 63 year old with a pericolonic abscess in the anterior pelvis. This is most compatible with a diverticular abscess. EXAM: CT-GUIDED PLACEMENT OF DRAIN IN PELVIC ABSCESS TECHNIQUE: Multidetector CT imaging of the pelvis was performed following the standard protocol without IV contrast. RADIATION DOSE REDUCTION: This exam was performed according to the departmental dose-optimization program which includes automated exposure control, adjustment of the mA and/or kV according to patient size and/or use of iterative reconstruction technique. MEDICATIONS: Moderate sedation ANESTHESIA/SEDATION: Moderate (conscious) sedation was employed during this procedure. A total of Versed  2 mg and Fentanyl  150 mcg was administered intravenously by the radiology nurse. Total intra-service moderate Sedation Time: 32 minutes. The patient's level of consciousness and vital signs were monitored continuously by radiology nursing throughout the procedure under my direct supervision. COMPLICATIONS: None immediate. PROCEDURE: Informed written consent was obtained from the patient after a thorough discussion of the procedural risks, benefits and alternatives. All questions were addressed. Maximal Sterile Barrier Technique was utilized including caps, mask, sterile gowns, sterile gloves, sterile drape, hand  hygiene and skin antiseptic. A timeout was performed prior to the initiation of the procedure. Patient was placed supine on the interventional table. Images through the pelvis were obtained. The anterior lower abdomen and pelvis was shaved. This area was prepped with chlorhexidine  and sterile field was created. Skin was anesthetized using 1% lidocaine . Small incision was made. Using CT guidance, an 18 gauge trocar needle was directed into the anterior pelvic air-fluid collection. Superstiff Amplatz wire was advanced into the collection. The tract was dilated to accommodate a 10 French multipurpose drain. Yellow purulent fluid was aspirated. Drain was attached to a suction bulb and sutured in place. Dressing was placed. FINDINGS: Air-fluid collection just anterior to the sigmoid colon and adjacent to the bladder in the anterior pelvis. Yellow purulent fluid aspirated from the collection. Drain is reconstituted within the collection at the end of the procedure. Inflammatory changes around the sigmoid colon compatible with acute diverticulitis. IMPRESSION: CT-guided placement of a drainage catheter in the pericolonic abscess. Electronically Signed   By: Juliene Balder M.D.   On: 03/17/2023 16:14    Medications / Allergies: per chart  Antibiotics: Anti-infectives (From admission, onward)    Start     Dose/Rate Route Frequency Ordered Stop   03/18/23 1430  piperacillin -tazobactam (ZOSYN ) IVPB 3.375 g        3.375 g 12.5 mL/hr over 240 Minutes Intravenous Every 8 hours 03/18/23 1331 03/23/23 1359   03/16/23 2200  cefTRIAXone  (ROCEPHIN ) 2 g in sodium chloride  0.9 % 100 mL IVPB  Status:  Discontinued       Placed in And Linked Group   2 g 200 mL/hr over 30 Minutes Intravenous Every 24 hours 03/16/23 2045 03/18/23 1331   03/16/23 2200  metroNIDAZOLE  (FLAGYL ) IVPB  500 mg  Status:  Discontinued       Placed in And Linked Group   500 mg 100 mL/hr over 60 Minutes Intravenous Every 12 hours 03/16/23 2045  03/18/23 1331   03/16/23 1315  piperacillin -tazobactam (ZOSYN ) IVPB 3.375 g        3.375 g 100 mL/hr over 30 Minutes Intravenous  Once 03/16/23 1314 03/16/23 1401         Note: Portions of this report may have been transcribed using voice recognition software. Every effort was made to ensure accuracy; however, inadvertent computerized transcription errors may be present.   Any transcriptional errors that result from this process are unintentional.    Elspeth KYM Schultze, MD, FACS, MASCRS Esophageal, Gastrointestinal & Colorectal Surgery Robotic and Minimally Invasive Surgery  [facility] [address], [address] [place], [place] [address] [phone] Fax [phone] Main  CONTACT INFORMATION: Weekday (9AM-5PM): Call [facility] main office at [phone] Weeknight (5PM-9AM) or Weekend/Holiday: Check EPIC Web Links tab & use AMION (password  [email or web address]) for General Surgery [facility] coverage  Please, DO NOT use SecureChat  (it is not reliable communication to reach operating surgeons & will lead to a delay in care).   Epic staff messaging available for outptient concerns needing 1-2 business day response.      03/18/2023  1:33 PM

## 2023-03-18 NOTE — Progress Notes (Signed)
 Referring Provider(s): Dr. Bernarda Ned, MD  Supervising Physician: [name]  Patient Status:  [facility] - In-pt  Chief Complaint: abdominal pain - diverticular abscess -  with intra-abdominal fluid collection drain placement.   Brief History:  Marcus Andrews is a 63 y.o. male with a medical history significant for diverticulitis with a prior hospitalization September 2024 for acute diverticulitis with microperforation. He was treated conservatively and scheduled for outpatient Surgical follow up with consideration for possible segmental colectomy. He had a colonoscopy recently which noted some sigmoid narrowing.    He presented to the ED 03/16/23 with worsening LLQ pain and imaging showed diverticulitis with abscess formation. IR placed a 10 Fr drain on 03/17/23.  Subjective:  Patient ambulating in his room, pleasant. No new complaints. Mild abdominal tenderness persists. No concerns with drain. Patient denied fevers/ chills, nausea/ vomiting.  Allergies: Patient has no known allergies.  Medications: Prior to Admission medications   Medication Sig Start Date End Date Taking? Authorizing Provider  acetaminophen  (TYLENOL ) 325 MG tablet Take 2 tablets (650 mg total) by mouth every 6 (six) hours as needed for mild pain, fever or headache. 12/09/22  Yes Danton Reyes DASEN, MD  albuterol  (VENTOLIN  HFA) 108 (90 Base) MCG/ACT inhaler TAKE 2 PUFFS BY MOUTH EVERY 6 HOURS AS NEEDED FOR WHEEZE OR SHORTNESS OF BREATH Patient taking differently: Inhale 2 puffs into the lungs every 6 (six) hours as needed for shortness of breath or wheezing. 09/24/22  Yes Dugal, Tabitha, FNP  Ensure (ENSURE) Take 237 mLs by mouth daily as needed (for supplementation- or, Boost). Or boost   Yes [provider]  glycopyrrolate  (ROBINUL ) 1 MG tablet Take 1 tablet (1 mg total) by mouth 2 (two) times daily. 04/21/22  Yes Aneita Gwendlyn DASEN, MD  lactose free nutrition (BOOST) LIQD Take 237 mLs by mouth daily as  needed (for supplementation- or, Ensure).   Yes [provider]  Magnesium  250 MG TABS Take 250 mg by mouth in the morning.   Yes [provider]  omeprazole  (PRILOSEC) 40 MG capsule TAKE 1 CAPSULE (40 MG TOTAL) BY MOUTH DAILY. Patient taking differently: Take 40 mg by mouth daily before breakfast. 02/02/23  Yes Aneita Gwendlyn DASEN, MD  traMADol  (ULTRAM ) 50 MG tablet Take 50 mg by mouth every 6 (six) hours as needed (for pain). 06/11/20  Yes [provider]  atorvastatin  (LIPITOR) 10 MG tablet Take 1 tablet (10 mg total) by mouth daily. Patient not taking: Reported on 03/16/2023 07/23/22   Dugal, Tabitha, FNP  ibuprofen  (ADVIL ) 600 MG tablet Take 1 tablet (600 mg total) by mouth every 8 (eight) hours as needed. Patient not taking: Reported on 03/08/2023 08/26/22   Corwin Antu, FNP     Vital Signs: BP 127/84 (BP Location: Right Arm)   Pulse 66   Temp 98.1 F (36.7 C) (Oral)   Resp 18   Ht 6' (1.829 m)   Wt 174 lb (78.9 kg)   SpO2 98%   BMI 23.60 kg/m   Physical Exam Constitutional:      General: He is not in acute distress. Pulmonary:     Effort: Pulmonary effort is normal.  Abdominal:     Tenderness: There is abdominal tenderness in the left lower quadrant.     Comments: Drain site clean/ dry/ intact, appropriately dressed. Minimal tenderness to palpation at drain site. 18 cc bloody output overnight.   Skin:    General: Skin is warm and dry.  Neurological:  Mental Status: He is alert and oriented to person, place, and time.  Psychiatric:        Mood and Affect: Mood normal.        Behavior: Behavior normal.      Labs:  CBC: Recent Labs    12/05/22 0415 12/07/22 1351 03/16/23 0700 03/17/23 0434  WBC 7.5 8.9 10.1 8.3  HGB 12.9* 13.3 13.3 12.8*  HCT 40.4 42.3 42.7 40.8  PLT 274 322 230 216    COAGS: No results for input(s): INR, APTT in the last 8760 hours.  BMP: Recent Labs    12/05/22 0415 12/08/22 0259 03/16/23 0700  03/17/23 0434  NA 136 141 137 138  K 4.1 3.6 3.6 3.3*  CL 106 105 104 103  CO2 25 26 28 28   GLUCOSE 113* 100* 90 87  BUN 6 5* 12 11  CALCIUM  8.6* 8.9 8.5* 8.5*  CREATININE 0.96 1.10 0.86 0.84  GFRNONAA >60 >60 >60 >60    LIVER FUNCTION TESTS: Recent Labs    12/01/22 1316 12/02/22 0306 03/16/23 0700  BILITOT 1.2 1.0 0.8  AST 15 11* 13*  ALT 15 12 13   ALKPHOS 84 68 68  PROT 7.4 5.8* 6.6  ALBUMIN 4.0 2.9* 3.5    Assessment and Plan:  Drain Location: LLQ Size: Fr size: 12 Fr Date of placement: 03/17/23  Currently to: Drain collection device: suction bulb 24 hour output:  Output by Drain (mL) 03/16/23 0701 - 03/16/23 1900 03/16/23 1901 - 03/17/23 0700 03/17/23 0701 - 03/17/23 1900 03/17/23 1901 - 03/18/23 0700 03/18/23 0701 - 03/18/23 1237  Closed System Drain 1 Left LLQ Bulb (JP) 10 Fr.   18      Interval imaging/drain manipulation:  none  Current examination: Flushes/aspirates easily.  Insertion site unremarkable. Suture and stat lock in place. Dressed appropriately.   Plan: Continue TID flushes with 5 cc NS. Record output Q shift. Dressing changes QD or PRN if soiled.  Call IR APP or on call IR MD if difficulty flushing or sudden change in drain output.  Repeat imaging/possible drain injection once output < 10 mL/QD (excluding flush material). Consideration for drain removal if output is < 10 mL/QD (excluding flush material), pending discussion with the providing surgical service.  Discharge planning: Please contact IR APP or on call IR MD prior to patient d/c to ensure appropriate follow up plans are in place. Typically patient will follow up with IR clinic 10-14 days post d/c for repeat imaging/possible drain injection. IR scheduler will contact patient with date/time of appointment. Patient will need to flush drain QD with 5 cc NS, record output QD, dressing changes every 2-3 days or earlier if soiled.   IR will continue to follow - please call with questions  or concerns.     Electronically Signed: Carlin DELENA Griffon, [name] 03/18/2023, 12:31 PM     I spent a total of 15 Minutes at the the patient's bedside AND on the patient's hospital floor or unit, greater than 50% of which was counseling/coordinating care for intra-abdominal fluid collection drain placement.

## 2023-03-18 NOTE — Plan of Care (Signed)

## 2023-03-18 NOTE — Telephone Encounter (Signed)
 Line busy

## 2023-03-18 NOTE — Progress Notes (Signed)
 Mobility Specialist - Progress Note   03/18/23 1248  Mobility  Activity Ambulated independently in hallway  Level of Assistance Independent  Assistive Device None  Distance Ambulated (ft) 350 ft  Activity Response Tolerated well  Mobility Referral Yes  Mobility visit 1 Mobility  Mobility Specialist Start Time (ACUTE ONLY) 1242  Mobility Specialist Stop Time (ACUTE ONLY) 1248  Mobility Specialist Time Calculation (min) (ACUTE ONLY) 6 min   Pt received in bed and agreeable to mobility. No complaints during session. Pt to bed after session with all needs met.    [facility]

## 2023-03-19 DIAGNOSIS — E876 Hypokalemia: Secondary | ICD-10-CM | POA: Insufficient documentation

## 2023-03-19 LAB — CBC
HCT: 41.9 % (ref 39.0–52.0)
Hemoglobin: 13.2 g/dL (ref 13.0–17.0)
MCH: 27.6 pg (ref 26.0–34.0)
MCHC: 31.5 g/dL (ref 30.0–36.0)
MCV: 87.7 fL (ref 80.0–100.0)
Platelets: 222 10*3/uL (ref 150–400)
RBC: 4.78 MIL/uL (ref 4.22–5.81)
RDW: 15.8 % — ABNORMAL HIGH (ref 11.5–15.5)
WBC: 5.2 10*3/uL (ref 4.0–10.5)
nRBC: 0 % (ref 0.0–0.2)

## 2023-03-19 LAB — POTASSIUM: Potassium: 3.5 mmol/L (ref 3.5–5.1)

## 2023-03-19 LAB — CREATININE, SERUM
Creatinine, Ser: 0.87 mg/dL (ref 0.61–1.24)
GFR, Estimated: >60 mL/min (ref >60)

## 2023-03-19 MED ORDER — POTASSIUM CHLORIDE CRYS ER 20 MEQ PO TBCR
40.0000 meq | EXTENDED_RELEASE_TABLET | Freq: Every day | ORAL | Status: DC
  Administered 2023-03-19 – 2023-03-20 (×2): 40 meq via ORAL
  Filled 2023-03-19 (×2): qty 2

## 2023-03-19 NOTE — Progress Notes (Signed)
 Referring Physician(s): Thomas,A  Supervising Physician: Luverne Aran  Patient Status:  [facility] - In-pt  Chief Complaint: Abdominal pain/abscess   Subjective: Pt doing fair; still has occ nausea, lower abd discomfort, some dysuria/pressure sensation with voiding   Allergies: Patient has no known allergies.  Medications: Prior to Admission medications   Medication Sig Start Date End Date Taking? Authorizing Provider  acetaminophen  (TYLENOL ) 325 MG tablet Take 2 tablets (650 mg total) by mouth every 6 (six) hours as needed for mild pain, fever or headache. 12/09/22  Yes Danton Reyes DASEN, MD  albuterol  (VENTOLIN  HFA) 108 (90 Base) MCG/ACT inhaler TAKE 2 PUFFS BY MOUTH EVERY 6 HOURS AS NEEDED FOR WHEEZE OR SHORTNESS OF BREATH Patient taking differently: Inhale 2 puffs into the lungs every 6 (six) hours as needed for shortness of breath or wheezing. 09/24/22  Yes Dugal, Tabitha, FNP  Ensure (ENSURE) Take 237 mLs by mouth daily as needed (for supplementation- or, Boost). Or boost   Yes [provider]  glycopyrrolate  (ROBINUL ) 1 MG tablet Take 1 tablet (1 mg total) by mouth 2 (two) times daily. 04/21/22  Yes Aneita Gwendlyn DASEN, MD  lactose free nutrition (BOOST) LIQD Take 237 mLs by mouth daily as needed (for supplementation- or, Ensure).   Yes [provider]  Magnesium  250 MG TABS Take 250 mg by mouth in the morning.   Yes [provider]  omeprazole  (PRILOSEC) 40 MG capsule TAKE 1 CAPSULE (40 MG TOTAL) BY MOUTH DAILY. Patient taking differently: Take 40 mg by mouth daily before breakfast. 02/02/23  Yes Aneita Gwendlyn DASEN, MD  traMADol  (ULTRAM ) 50 MG tablet Take 50 mg by mouth every 6 (six) hours as needed (for pain). 06/11/20  Yes [provider]  atorvastatin  (LIPITOR) 10 MG tablet Take 1 tablet (10 mg total) by mouth daily. Patient not taking: Reported on 03/16/2023 07/23/22   Dugal, Tabitha, FNP  ibuprofen  (ADVIL ) 600 MG tablet Take 1 tablet (600 mg  total) by mouth every 8 (eight) hours as needed. Patient not taking: Reported on 03/08/2023 08/26/22   Corwin Antu, FNP     Vital Signs: BP 128/86 (BP Location: Left Arm)   Pulse 60   Temp 98.1 F (36.7 C) (Oral)   Resp 18   Ht 6' (1.829 m)   Wt 174 lb (78.9 kg)   SpO2 94%   BMI 23.60 kg/m   Physical Exam; awake/alert; ant lower abd drain intact, insertion site ok, mildly tender to palpation, OP 10 cc serous fluid; some abd discomfort noted with drain flush  Imaging: CT GUIDED PERITONEAL/RETROPERITONEAL FLUID DRAIN BY PERC CATH Result Date: 03/17/2023 INDICATION: 63 year old with a pericolonic abscess in the anterior pelvis. This is most compatible with a diverticular abscess. EXAM: CT-GUIDED PLACEMENT OF DRAIN IN PELVIC ABSCESS TECHNIQUE: Multidetector CT imaging of the pelvis was performed following the standard protocol without IV contrast. RADIATION DOSE REDUCTION: This exam was performed according to the departmental dose-optimization program which includes automated exposure control, adjustment of the mA and/or kV according to patient size and/or use of iterative reconstruction technique. MEDICATIONS: Moderate sedation ANESTHESIA/SEDATION: Moderate (conscious) sedation was employed during this procedure. A total of Versed  2 mg and Fentanyl  150 mcg was administered intravenously by the radiology nurse. Total intra-service moderate Sedation Time: 32 minutes. The patient's level of consciousness and vital signs were monitored continuously by radiology nursing throughout the procedure under my direct supervision. COMPLICATIONS: None immediate. PROCEDURE: Informed written consent was obtained from the patient after a thorough discussion  of the procedural risks, benefits and alternatives. All questions were addressed. Maximal Sterile Barrier Technique was utilized including caps, mask, sterile gowns, sterile gloves, sterile drape, hand hygiene and skin antiseptic. A timeout was performed prior  to the initiation of the procedure. Patient was placed supine on the interventional table. Images through the pelvis were obtained. The anterior lower abdomen and pelvis was shaved. This area was prepped with chlorhexidine  and sterile field was created. Skin was anesthetized using 1% lidocaine . Small incision was made. Using CT guidance, an 18 gauge trocar needle was directed into the anterior pelvic air-fluid collection. Superstiff Amplatz wire was advanced into the collection. The tract was dilated to accommodate a 10 French multipurpose drain. Yellow purulent fluid was aspirated. Drain was attached to a suction bulb and sutured in place. Dressing was placed. FINDINGS: Air-fluid collection just anterior to the sigmoid colon and adjacent to the bladder in the anterior pelvis. Yellow purulent fluid aspirated from the collection. Drain is reconstituted within the collection at the end of the procedure. Inflammatory changes around the sigmoid colon compatible with acute diverticulitis. IMPRESSION: CT-guided placement of a drainage catheter in the pericolonic abscess. Electronically Signed   By: Juliene Balder M.D.   On: 03/17/2023 16:14   CT ABDOMEN PELVIS W CONTRAST Result Date: 03/16/2023 CLINICAL DATA:  Left lower quadrant pain EXAM: CT ABDOMEN AND PELVIS WITH CONTRAST TECHNIQUE: Multidetector CT imaging of the abdomen and pelvis was performed using the standard protocol following bolus administration of intravenous contrast. RADIATION DOSE REDUCTION: This exam was performed according to the departmental dose-optimization program which includes automated exposure control, adjustment of the mA and/or kV according to patient size and/or use of iterative reconstruction technique. CONTRAST:  OMNIPAQUE  IOHEXOL  300 MG/ML  SOLN COMPARISON:  CT 12/07/2022 and older. FINDINGS: Lower chest: There is some linear opacity lung bases bilaterally likely scar or atelectasis, similar to previous. There is also some  emphysematous changes identified at the bases. No pleural effusion. Hepatobiliary: Patent portal vein. There are scattered tiny low-attenuation lesion seen in the liver which are too small to completely characterize but likely benign cystic foci and are unchanged from previous examination. Gallbladder is nondilated. Pancreas: Mild ectasia of the pancreatic duct measuring up 3 mm on coronal imaging. Similar to previous in retrospect Spleen: Normal in size without focal abnormality. Adrenals/Urinary Tract: Adrenal glands are unremarkable. No enhancing renal mass or collecting system dilatation. Bladder is nondilated but there is some wall thickening along the superior aspect of the bladder. Mild abnormal enhancement. Stomach/Bowel: There is wall thickening along the sigmoid colon with significant stranding and diverticula. There is a bilobed extraluminal fluid collection with an air-fluid level extending inferior to the sigmoid colon and abutting the superior aspect of the urinary bladder. Overall this area measures 4.6 by 3.2 by 2.5 cm. This could represent diverticulitis with a small abscess. There is also a small mural area of air and fluid along the sigmoid colon just proximal on series 2, image 55. No associated obstruction. No widespread free air. Normal appendix in the right lower quadrant. Scattered colonic stool. The stomach and small bowel are nondilated on this non oral contrast examination. Vascular/Lymphatic: Aortic atherosclerosis. No enlarged abdominal or pelvic lymph nodes. Reproductive: Prostate is unremarkable. Other: No free intra-air. Musculoskeletal: Degenerative changes along the lower lumbar spine particularly L4-5 and L5-S1 with disc bulging and encroachment. IMPRESSION: Wall thickening and inflammatory changes along the sigmoid colon consistent with diverticulitis. There is what appears to be an extraluminal abscess extending  caudal to the sigmoid colon measuring 4.6 x 3.2 cm. This is  immediately adjacent to the dome of the bladder with some bladder wall thickening and adjacent stranding. Additional smaller intramural abscess suggested along the course of the sigmoid colon just proximal to this. Please correlate clinical presentation and recommend follow up after treatment to confirm resolution and exclude secondary pathology. Stable mild ectasia of the pancreatic duct without focal pancreatic atrophy or obvious pancreatic mass. Electronically Signed   By: Ranell Bring M.D.   On: 03/16/2023 13:00    Labs:  CBC: Recent Labs    12/07/22 1351 03/16/23 0700 03/17/23 0434 03/19/23 0449  WBC 8.9 10.1 8.3 5.2  HGB 13.3 13.3 12.8* 13.2  HCT 42.3 42.7 40.8 41.9  PLT 322 230 216 222    COAGS: No results for input(s): INR, APTT in the last 8760 hours.  BMP: Recent Labs    12/05/22 0415 12/08/22 0259 03/16/23 0700 03/17/23 0434 03/19/23 0449  NA 136 141 137 138  --   K 4.1 3.6 3.6 3.3* 3.5  CL 106 105 104 103  --   CO2 25 26 28 28   --   GLUCOSE 113* 100* 90 87  --   BUN 6 5* 12 11  --   CALCIUM  8.6* 8.9 8.5* 8.5*  --   CREATININE 0.96 1.10 0.86 0.84 0.87  GFRNONAA >60 >60 >60 >60 >60    LIVER FUNCTION TESTS: Recent Labs    12/01/22 1316 12/02/22 0306 03/16/23 0700  BILITOT 1.2 1.0 0.8  AST 15 11* 13*  ALT 15 12 13   ALKPHOS 84 68 68  PROT 7.4 5.8* 6.6  ALBUMIN 4.0 2.9* 3.5    Assessment and Plan:  63 y.o. male with a medical history significant for diverticulitis with a prior hospitalization September 2024 for acute diverticulitis with microperforation. He was treated conservatively and scheduled for outpatient surgical follow up with consideration for possible segmental colectomy. He had a colonoscopy recently which noted some sigmoid narrowing.  He presented to the ED 03/16/23 with worsening LLQ pain and imaging showed diverticulitis with abscess formation. IR placed a 10 Fr drain on 03/17/23 (10 fr to JP); afebrile, WBC nl, hgb nl, creat nl, drain  fl cx- few strept group F  Output by Drain (mL) 03/17/23 0701 - 03/17/23 1900 03/17/23 1901 - 03/18/23 0700 03/18/23 0701 - 03/18/23 1900 03/18/23 1901 - 03/19/23 0700 03/19/23 0701 - 03/19/23 1434  Closed System Drain 1 Left LLQ Bulb (JP) 10 Fr. 18  10 0    Cont current tx/drain irrigation;  as outpt, patient will need to flush drain QD with 5 cc NS, record output QD, dressing changes every 2-3 days or earlier if soiled. He will be scheduled for IR drain clinic f/u in 10-14 days; if clinical status worsens consider f/u CT sooner   Electronically Signed: D. Franky Rakers, [name] 03/19/2023, 2:29 PM   I spent a total of 15 minutes at the the patient's bedside AND on the patient's hospital floor or unit, greater than 50% of which was counseling/coordinating care for pelvic abscess drain    Patient ID: Marcus Andrews, male   DOB: 07-04-1960, 63 y.o.   MRN: [id number]

## 2023-03-19 NOTE — Progress Notes (Addendum)
 03/19/2023  AUM CAGGIANO [id number] 01/14/1961  CARE TEAM: PCP: Corwin Antu, FNP  Outpatient Care Team: Patient Care Team: Corwin Antu, FNP as PCP - General (Family Medicine) Aneita Gwendlyn DASEN, MD as Consulting Physician (Gastroenterology) Sheldon Standing, MD as Consulting Physician (General Surgery)  Inpatient Treatment Team: Treatment Team:  Caroleen, Md, MD [facility], MD Sheldon Standing, MD Vicci Sharene BRAVO, RN Carolee Rosaline DASEN, [facility]   Problem List:   Principal Problem:   Diverticulitis of colon with perforation Active Problems:   Prediabetes   Allergic rhinitis   Dyslipidemia   Snoring   Transfusion of blood product refused for religious reason (Jehovah witness)   Diverticulitis of large intestine with abscess   Colonic diverticular abscess   Heartburn   03/17/2023  PROCEDURE:  CT-guided placement of a drainage catheter in the pericolonic abscess.  FINDINGS: Air-fluid collection just anterior to the sigmoid colon and adjacent to the bladder in the anterior pelvis. Yellow purulent fluid aspirated from the collection. Drain is reconstituted within the collection at the end of the procedure. Inflammatory changes around the sigmoid colon compatible with acute diverticulitis.   IR Radiologist:  Juliene Balder M.D    Assessment [facility] Stay = 3 days)      Recurrent diverticulitis with abscess.  IR drainage 1/1   Slowly recovering    Plan:  With nausea slowly advance diet.  Not nauseated this morning so try soft diet.  Pain control with Tylenol  and stronger options just in case.  IV antibiotics.  Will do piperacillin /tazobactam given multiple species on initial Gram stain in this a complex attack with an abscess.  Hopefully he can recover with his ileus & advance diet switch to oral antibiotics with outpatient drain study and then plan robotic colectomy in about 6 weeks once the risk of infection is less and therefore the  likelihood of immediate anastomosis and minimize the risk of temporary colostomy.  Patient was scheduled for robotic sigmoid colectomy next week but given this attack, most likely will have to postpone this another 6 weeks.  Tentatively rescheduled 05/12/2023.  GERD.  PPI.  Do Protonix  since that is formulary.  Hard to tell if his nausea symptoms are more heartburn related.  Keep him on PPI and have Maalox for breakthrough  Borderline hypokalemia.  Orally replaced.  Check magnesium  and follow-up.  Should resolve  Hyper lipidemia controlled with Lipitor.  Watch blood pressure  Albuterol  as needed for some occasional asthma.  VTE prophylaxis- SCDs, etc  -mobilize as tolerated to help recovery  -Disposition:  Disposition:  The patient is from: Home Anticipate discharge to:  Home Anticipated Date of Discharge is:  January 4,2025   Barriers to discharge:  Pending Clinical improvement (more likely than not)  Patient currently is NOT MEDICALLY STABLE for discharge from the hospital from a surgery standpoint.      I reviewed nursing notes, ED provider notes, last 24 h vitals and pain scores, last 48 h intake and output, last 24 h labs and trends, and last 24 h imaging results.  I have reviewed this patient's available data, including medical history, events of note, test results, etc as part of my evaluation.   A significant portion of that time was spent in counseling. Care during the described time interval was provided by me.  This care required moderate level of medical decision making.  03/19/2023    Subjective: (Chief complaint)  Patient tolerated liquids but felt a little nauseated last night.  No vomiting.  Walking the hallways.  Still with some soreness but a little better.  Objective:  Vital signs:  Vitals:   03/18/23 0520 03/18/23 1324 03/18/23 1943 03/19/23 0455  BP: 127/84 (!) 141/87 135/89 128/86  Pulse: 66 (!) 56 (!) 55 60  Resp: 18 18 17 18   Temp:  98.1 F (36.7 C) 98.3 F (36.8 C) 97.8 F (36.6 C) 98.1 F (36.7 C)  TempSrc: Oral Oral Oral Oral  SpO2: 98% 100% 98% 94%  Weight:      Height:        Last BM Date : 03/15/23  Intake/Output   Yesterday:  01/02 0701 - 01/03 0700 In: 0  Out: 10 [Drains:10] This shift:  No intake/output data recorded.  Bowel function:  Flatus: YES  BM:  No  Drain: Serosanguinous   Physical Exam:  General: Pt awake/alert in no acute distress Eyes: PERRL, normal EOM.  Sclera clear.  No icterus Neuro: CN II-XII intact w/o focal sensory/motor deficits. Lymph: No head/neck/groin lymphadenopathy Psych:  No delerium/psychosis/paranoia.  Oriented x 4 HENT: Normocephalic, Mucus membranes moist.  No thrush Neck: Supple, No tracheal deviation.  No obvious thyromegaly Chest: No pain to chest wall compression.  Good respiratory excursion.  No audible wheezing CV:  Pulses intact.  Regular rhythm.  No major extremity edema MS: Normal AROM mjr joints.  No obvious deformity  Abdomen: Soft.  Nondistended.  Tenderness at left lower quadrant primarily -mild without any guarding. .  No evidence of peritonitis.  No incarcerated hernias.  Ext:   No deformity.  No mjr edema.  No cyanosis Skin: No petechiae / purpurea.  No major sores.  Warm and dry    Results:   Cultures: Recent Results (from the past 720 hours)  Blood culture (routine x 2)     Status: None (Preliminary result)   Collection Time: 03/16/23  1:21 PM   Specimen: BLOOD  Result Value Ref Range Status   Specimen Description   Final    BLOOD LEFT ANTECUBITAL Performed at [facility], [address], [place], [place] [address]    Special Requests   Final    BOTTLES DRAWN AEROBIC AND ANAEROBIC Blood Culture results may not be optimal due to an inadequate volume of blood received in culture bottles Performed at [facility], [address], [place], [place] [address]    Culture   Final    NO GROWTH 2  DAYS Performed at [facility], [address], [place], [place] [address]    Report Status PENDING  Incomplete  Blood culture (routine x 2)     Status: None (Preliminary result)   Collection Time: 03/16/23  4:38 PM   Specimen: BLOOD  Result Value Ref Range Status   Specimen Description   Final    BLOOD SITE NOT SPECIFIED Performed at [facility], [address] [place], [place] [address]    Special Requests   Final    BOTTLES DRAWN AEROBIC AND ANAEROBIC Blood Culture results may not be optimal due to an inadequate volume of blood received in culture bottles Performed at [facility], [address], [place], [place] [address]    Culture   Final    NO GROWTH 2 DAYS Performed at [facility], [address], [place], [place] [address]    Report Status PENDING  Incomplete  Aerobic/Anaerobic Culture w Gram Stain (surgical/deep wound)     Status: None (Preliminary result)   Collection  Time: 03/17/23 12:53 PM   Specimen: Abscess  Result Value Ref Range Status   Specimen Description   Final    ABSCESS Performed at [facility], [address] [place], [place] [address]    Special Requests   Final    NONE Performed at [facility], [address], [place], [place] [address]    Gram Stain   Final    ABUNDANT WBC PRESENT, PREDOMINANTLY PMN RARE GRAM POSITIVE COCCI    Culture   Final    FEW STREPTOCOCCUS CONSTELLATUS RARE GRAM NEGATIVE RODS CULTURE REINCUBATED FOR BETTER GROWTH Performed at [facility], [address], [place], [place] [address]    Report Status PENDING  Incomplete    Labs: Results for orders placed or performed during the hospital encounter of 03/16/23 (from the past 48 hours)  Aerobic/Anaerobic Culture w Gram Stain (surgical/deep wound)     Status: None (Preliminary result)   Collection Time: 03/17/23 12:53 PM   Specimen: Abscess  Result Value Ref Range    Specimen Description      ABSCESS Performed at [facility], [address], [place], [place] [address]    Special Requests      NONE Performed at [facility], [address] [place], [place] [address]    Gram Stain      ABUNDANT WBC PRESENT, PREDOMINANTLY PMN RARE GRAM POSITIVE COCCI    Culture      FEW STREPTOCOCCUS CONSTELLATUS RARE GRAM NEGATIVE RODS CULTURE REINCUBATED FOR BETTER GROWTH Performed at [facility], [address] [place], [place] [address]    Report Status PENDING   CBC     Status: Abnormal   Collection Time: 03/19/23  4:49 AM  Result Value Ref Range   WBC 5.2 4.0 - 10.5 K/uL   RBC 4.78 4.22 - 5.81 MIL/uL   Hemoglobin 13.2 13.0 - 17.0 g/dL   HCT 58.0 60.9 - 47.9 %   MCV 87.7 80.0 - 100.0 fL   MCH 27.6 26.0 - 34.0 pg   MCHC 31.5 30.0 - 36.0 g/dL   RDW 84.1 (H) 88.4 - 84.4 %   Platelets 222 150 - 400 K/uL   nRBC 0.0 0.0 - 0.2 %    Comment: Performed at [facility], [address] [place], [place] [address]  Potassium     Status: None   Collection Time: 03/19/23  4:49 AM  Result Value Ref Range   Potassium 3.5 3.5 - 5.1 mmol/L    Comment: Performed at [facility], [address] [place], [place] [address]  Creatinine, serum     Status: None   Collection Time: 03/19/23  4:49 AM  Result Value Ref Range   Creatinine, Ser 0.87 0.61 - 1.24 mg/dL   GFR, Estimated >39 >39 mL/min    Comment: (NOTE) Calculated using the CKD-EPI Creatinine Equation (2021) Performed at [facility], [address], [place], [place] [address]     Imaging / Studies: CT GUIDED PERITONEAL/RETROPERITONEAL FLUID DRAIN BY [facility] CATH Result Date: 03/17/2023 INDICATION: 63 year old with a pericolonic abscess in the anterior pelvis. This is most compatible with a diverticular abscess. EXAM: CT-GUIDED PLACEMENT OF DRAIN IN PELVIC ABSCESS TECHNIQUE: Multidetector CT imaging of the  pelvis was performed following the standard protocol without IV contrast. RADIATION DOSE REDUCTION: This exam was performed according to the departmental dose-optimization program which includes automated exposure control, adjustment of the mA and/or kV  according to patient size and/or use of iterative reconstruction technique. MEDICATIONS: Moderate sedation ANESTHESIA/SEDATION: Moderate (conscious) sedation was employed during this procedure. A total of Versed  2 mg and Fentanyl  150 mcg was administered intravenously by the radiology nurse. Total intra-service moderate Sedation Time: 32 minutes. The patient's level of consciousness and vital signs were monitored continuously by radiology nursing throughout the procedure under my direct supervision. COMPLICATIONS: None immediate. PROCEDURE: Informed written consent was obtained from the patient after a thorough discussion of the procedural risks, benefits and alternatives. All questions were addressed. Maximal Sterile Barrier Technique was utilized including caps, mask, sterile gowns, sterile gloves, sterile drape, hand hygiene and skin antiseptic. A timeout was performed prior to the initiation of the procedure. Patient was placed supine on the interventional table. Images through the pelvis were obtained. The anterior lower abdomen and pelvis was shaved. This area was prepped with chlorhexidine  and sterile field was created. Skin was anesthetized using 1% lidocaine . Small incision was made. Using CT guidance, an 18 gauge trocar needle was directed into the anterior pelvic air-fluid collection. Superstiff Amplatz wire was advanced into the collection. The tract was dilated to accommodate a 10 French multipurpose drain. Yellow purulent fluid was aspirated. Drain was attached to a suction bulb and sutured in place. Dressing was placed. FINDINGS: Air-fluid collection just anterior to the sigmoid colon and adjacent to the bladder in the anterior pelvis. Yellow purulent  fluid aspirated from the collection. Drain is reconstituted within the collection at the end of the procedure. Inflammatory changes around the sigmoid colon compatible with acute diverticulitis. IMPRESSION: CT-guided placement of a drainage catheter in the pericolonic abscess. Electronically Signed   By: Juliene Balder M.D.   On: 03/17/2023 16:14    Medications / Allergies: per chart  Antibiotics: Anti-infectives (From admission, onward)    Start     Dose/Rate Route Frequency Ordered Stop   03/18/23 1430  piperacillin -tazobactam (ZOSYN ) IVPB 3.375 g        3.375 g 12.5 mL/hr over 240 Minutes Intravenous Every 8 hours 03/18/23 1331 03/23/23 1359   03/16/23 2200  cefTRIAXone  (ROCEPHIN ) 2 g in sodium chloride  0.9 % 100 mL IVPB  Status:  Discontinued       Placed in And Linked Group   2 g 200 mL/hr over 30 Minutes Intravenous Every 24 hours 03/16/23 2045 03/18/23 1331   03/16/23 2200  metroNIDAZOLE  (FLAGYL ) IVPB 500 mg  Status:  Discontinued       Placed in And Linked Group   500 mg 100 mL/hr over 60 Minutes Intravenous Every 12 hours 03/16/23 2045 03/18/23 1331   03/16/23 1315  piperacillin -tazobactam (ZOSYN ) IVPB 3.375 g        3.375 g 100 mL/hr over 30 Minutes Intravenous  Once 03/16/23 1314 03/16/23 1401         Note: Portions of this report may have been transcribed using voice recognition software. Every effort was made to ensure accuracy; however, inadvertent computerized transcription errors may be present.   Any transcriptional errors that result from this process are unintentional.    Elspeth KYM Schultze, MD, FACS, MASCRS Esophageal, Gastrointestinal & Colorectal Surgery Robotic and Minimally Invasive Surgery  [facility] [address], [address] [place], [place] [address] [phone] Fax [phone] Main  CONTACT INFORMATION: Weekday (9AM-5PM): Call [facility] main office at [phone] Weeknight (5PM-9AM) or  Weekend/Holiday: Check EPIC Web Links tab & use AMION (password  [email or web address]) for General Surgery [facility] coverage  Please, DO NOT use SecureChat  (it is not reliable communication to reach operating surgeons & will lead to a delay in care).   Epic staff messaging available for outptient concerns needing 1-2 business day response.      03/19/2023  7:16 AM

## 2023-03-19 NOTE — Plan of Care (Signed)

## 2023-03-19 NOTE — Discharge Instructions (Addendum)
 Interventional Radiology Percutaneous Abscess Drain Placement After Care   This sheet gives you information about how to care for yourself after your procedure. Your health care provider may also give you more specific instructions. Your drain was placed by an interventional radiologist with [facility]. If you have questions or concerns, contact [facility] at [phone].   What is a percutaneous drain?   A drain is a small plastic tube (catheter) that goes into the fluid collection in your body through your skin.   How long will I need the drain?   How long the drain needs to stay in is determined by where the drain is, how much comes out of the drain each day and if you are having any other surgical procedures.   Interventional radiology will determine when it is time to remove the drain. It is important to follow up as directed so that the drain can be removed as soon as it is safe to do so.   What can I expect after the procedure?   After the procedure, it is common to have:   A small amount of bruising and discomfort in the area where the drainage tube (catheter) was placed.   Sleepiness and fatigue. This should go away after the medicines you were given have worn off.   Follow these instructions at home:   Insertion site care   Check your insertion site when you change the bandage. Check for:   More redness, swelling, or pain.   More fluid or blood.   Warmth.   Pus or a bad smell.   When caring for your insertion site:   Wash your hands with soap and water for at least 20 seconds before and after you change your bandage (dressing). If soap and water are not available, use hand sanitizer.   You do not need to change your dressing everyday if it is clean and dry. Change your dressing every 3 days or as needed when it is soiled, wet or becoming dislodged. You will need to change your dressing each time you shower.   Leave stitches (sutures), skin  glue, or adhesive strips in place. These skin closures may need to stay in place for 2 weeks or longer. If adhesive strip edges start to loosen and curl up, you may trim the loose edges. Do not remove adhesive strips completely unless your health care provider tells you to do so.   Catheter care   Flush the catheter once per day with 5 mL of 0.9% normal saline unless you are told otherwise by your healthcare provider. This helps to prevent clogs in the catheter.   To disconnect the drain, turn the clear plastic tube to the left. Attach the saline syringe by placing it on the white end of the drain and turning gently to the right. Once attached gently push the plunger to the 5 mL mark. After you are done flushing, disconnect the syringe by turning to the left and reattach your drainage container   If you have a bulb please be sure the bulb is charged after reconnecting it - to do this pinch the bulb between your thumb and first finger and close the stopper located on the top of the bulb.    Check for fluid leaking from around your catheter (instead of fluid draining through your catheter). This may be a sign that the drain is no longer working correctly.   Write down the following information every time you empty your  bag:   The date and time.   The amount of drainage.   Activity   Rest at home for 1-2 days after your procedure.   For the first 48 hours do not lift anything more than 10 lbs (about a gallon of milk). You may perform moderate activities/exercise. Please avoid strenuous activities during this time.   Avoid any activities which may pull on your drain as this can cause your drain to become dislodged.   If you were given a sedative during the procedure, it can affect you for several hours. Do not drive or operate machinery until your health care provider says that it is safe.   General instructions   For mild pain take over-the-counter medications as needed for pain such as  Tylenol or Advil. If you are experiencing severe pain please call our office as this may indicate an issue with your drain.    If you were prescribed an antibiotic medicine, take it as told by your health care provider. Do not stop using the antibiotic even if you start to feel better.   You may shower 24 hours after the drain is placed. To do this cover the insertion site with a water tight material such as saran wrap and seal the edges with tape, you may also purchase waterproof dressings at your local drug store. Shower as usual and then remove the water tight dressing and any gauze/tape underneath it once you have exited the shower and dried off. Allow the area to air dry or pat dry with a clean towel. Once the skin is completely dry place a new gauze dressing. It is important to keep the site dry at all times to prevent infection.   Do not submerge the drain - this means you cannot take baths, swim, use a hot tub, etc. until the drain is removed.    Do not use any products that contain nicotine or tobacco, such as cigarettes, e-cigarettes, and chewing tobacco. If you need help quitting, ask your health care provider.   Keep all follow-up visits as told by your health care provider. This is important.   Contact a health care provider if:   You have less than 10 mL of drainage a day for 2-3 days in a row, or as directed by your health care provider.   You have any of these signs of infection:   More redness, swelling, or pain around your incision area.   More fluid or blood coming from your incision area.   Warmth coming from your incision area.   Pus or a bad smell coming from your incision area.   You have fluid leaking from around your catheter (instead of through your catheter).   You are unable to flush the drain.   You have a fever or chills.   You have pain that does not get better with medicine.   You have not been contacted to schedule a drain follow up appointment  within 10 days of discharge from the hospital.   Please call [facility] at [phone] with any questions or concerns.   Get help right away if:   Your catheter comes out.   You suddenly stop having drainage from your catheter.   You suddenly have blood in the fluid that is draining from your catheter.   You become dizzy or you faint.   You develop a rash.   You have nausea or vomiting.   You have difficulty breathing or you feel short  of breath.   You develop chest pain.   You have problems with your speech or vision.   You have trouble balancing or moving your arms or legs.   Summary   It is common to have a small amount of bruising and discomfort in the area where the drainage tube (catheter) was placed. You may also have minor discomfort with movement while the drain is in place.   Flush the drain once per day with 5 mL of 0.9% normal saline (unless you were told otherwise by your healthcare provider).    Record the amount of drainage from the bag every time you empty it.   Change the dressing every 3 days or earlier if soiled/wet. Keep the skin dry under the dressing.   You may shower with the drain in place. Do not submerge the drain (no baths, swimming, hot tubs, etc.).   Contact [facility] at [phone] if you have more redness, swelling, or pain around your incision area or if you have pain that does not get better with medicine.   This information is not intended to replace advice given to you by your health care provider. Make sure you discuss any questions you have with your health care provider.   Document Revised: 06/05/2021 Document Reviewed: 02/25/2019   Elsevier Patient Education  2023 [employer] Inc[employer]         Interventional Radiology Drain Record   Empty your drain at least once per day. You may empty it as often as needed. Use this form to write down the amount of fluid that has collected in the drainage container. Bring  this form with you to your follow-up visits. Please call [facility] at [phone] with any questions or concerns prior to your appointment.   Drain #1 location: ___________________   Date __________ Time __________ Amount __________   Date __________ Time __________ Amount __________   Date __________ Time __________ Amount __________   Date __________ Time __________ Amount __________   Date __________ Time __________ Amount __________   Date __________ Time __________ Amount __________   Date __________ Time __________ Amount __________   Date __________ Time __________ Amount __________   Date __________ Time __________ Amount __________   Date __________ Time __________ Amount __________   Date __________ Time __________ Amount __________   Date __________ Time __________ Amount __________   Date __________ Time __________ Amount __________   Date __________ Time __________ Amount __________

## 2023-03-19 NOTE — Telephone Encounter (Signed)
 The pt is currently admitted for diverticulitis.Marland Kitchen

## 2023-03-19 NOTE — TOC CM/SW Note (Addendum)
  Transition of Care [facility]) - Inpatient Brief Assessment   Patient Details  Name: Marcus Andrews MRN: [id number] Date of Birth: 12/14/1960  [facility] CM/SW Contact:    Tawni CHRISTELLA Eva, LCSW Phone Number: 03/19/2023, 2:21 PM   Transition of Care Asessment: Insurance and Status: Insurance coverage has been reviewed Patient has primary care physician: Yes Home environment has been reviewed: home with self Prior level of function:: independent Prior/Current Home Services: No current home services Social Drivers of Health Review: SDOH reviewed no interventions necessary Readmission risk has been reviewed: Yes Transition of care needs: no transition of care needs at this time

## 2023-03-20 ENCOUNTER — Other Ambulatory Visit (HOSPITAL_COMMUNITY): Payer: Self-pay

## 2023-03-20 ENCOUNTER — Other Ambulatory Visit: Payer: Self-pay

## 2023-03-20 MED ORDER — SODIUM CHLORIDE FLUSH 0.9 % IV SOLN
5.0000 mL | Freq: Every day | INTRAVENOUS | 3 refills | Status: DC
  Filled 2023-03-20: qty 250, 55d supply, fill #0
  Filled 2023-03-20: qty 50, 5d supply, fill #0
  Filled 2023-03-26: qty 100, 10d supply, fill #1

## 2023-03-20 MED ORDER — TRAMADOL HCL 50 MG PO TABS: 50.0000 mg | ORAL_TABLET | Freq: Three times a day (TID) | ORAL | 0 refills | Status: DC | PRN

## 2023-03-20 MED ORDER — AMOXICILLIN-POT CLAVULANATE 875-125 MG PO TABS: 1.0000 | ORAL_TABLET | Freq: Two times a day (BID) | ORAL | 1 refills | Status: DC

## 2023-03-20 NOTE — Discharge Summary (Addendum)
 Physician Discharge Summary    Marcus Andrews MRN: [id number] DOB/AGE: Feb 11, 1961 = 63 y.o.  Patient Care Team: Corwin Antu, FNP as PCP - General (Family Medicine) Aneita Gwendlyn DASEN, MD as Consulting Physician (Gastroenterology) Sheldon Standing, MD as Consulting Physician (General Surgery)  Admit date: 03/16/2023  Discharge date: 03/20/2023  Hospital Stay = 4 days    Discharge Diagnoses:  Principal Problem:   Diverticulitis of colon with perforation Active Problems:   Prediabetes   Allergic rhinitis   Dyslipidemia   Snoring   Transfusion of blood product refused for religious reason (Jehovah witness)   Diverticulitis of large intestine with abscess   Colonic diverticular abscess   Heartburn   Hypokalemia      * No surgery found *    Consults: Pharmacy and Interventional radiology  Hospital Course:   Patient with history of recurrent sigmoid colon diverticulitis with planning for robotic elective resection in early January.  Came in with abdominal pain and CT scan finding concerning for diverticulitis with abscess.  He was admitted and placed on IV antibiotics.  He underwent percutaneous drainage of the abscess by interventional radiology.  Pain and soreness waned.  He was able to advance on his diet.  The patient gradually mobilized and advanced to a solid diet.  Pain and other symptoms were treated aggressively.    By the time of discharge, the patient was walking well the hallways, eating food, having flatus.  Pain was well-controlled on an oral medications.  Based on meeting discharge criteria and continuing to recover, I felt it was safe for the patient to be discharged from the hospital to further recover with close followup.  Patient will need follow-up with an outpatient interventional radiology drain clinic for drain study to see if abscess is resolved and no colon fistula.  Patient feels comfortable managing flushing and care of drain postoperative  recommendations were discussed in detail.  They are written as well.  Discharged Condition: good  Discharge Exam: Blood pressure (!) 128/92, pulse 60, temperature 98.6 F (37 C), temperature source Oral, resp. rate 15, height 6' (1.829 m), weight 78.9 kg, SpO2 98%.  General: Pt awake/alert/oriented x4 in No acute distress Eyes: PERRL, normal EOM.  Sclera clear.  No icterus Neuro: CN II-XII intact w/o focal sensory/motor deficits. Lymph: No head/neck/groin lymphadenopathy Psych:  No delerium/psychosis/paranoia HENT: Normocephalic, Mucus membranes moist.  No thrush Neck: Supple, No tracheal deviation Chest: No chest wall pain w good excursion CV:  Pulses intact.  Regular rhythm MS: Normal AROM mjr joints.  No obvious deformity Abdomen: Soft.  Nondistended.  Tenderness at left lower quadrant drain site only. .  No evidence of peritonitis.  No incarcerated hernias. Ext:  SCDs BLE.  No mjr edema.  No cyanosis Skin: No petechiae / purpura   Disposition:    Follow-up Information     [name], MD Follow up in 2 week(s).   Specialties: Interventional Radiology, Radiology Contact information: [address] [place] [address] [phone]         Sheldon Standing, MD. Call on 04/21/2023.   Specialties: General Surgery, Colon and Rectal Surgery Why: 3:00pm, Arrive 15 minutes prior to your appointment time, Please bring your insurance card and photo ID Contact information: [address] [place] [address] [phone]                 Discharge disposition: 01-Home or Self Care       Discharge Instructions  Call MD for:   Complete by: As directed    FEVER > 101.5 F  (temperatures < 101.5 F are not significant)   Call MD for:  extreme fatigue   Complete by: As directed    Call MD for:  persistant dizziness or light-headedness   Complete by: As directed    Call MD for:  persistant nausea and vomiting   Complete by: As directed     Call MD for:  redness, tenderness, or signs of infection (pain, swelling, redness, odor or green/yellow discharge around incision site)   Complete by: As directed    Call MD for:  severe uncontrolled pain   Complete by: As directed    Diet - low sodium heart healthy   Complete by: As directed    Start with a bland diet such as soups, liquids, starchy foods, low fat foods, etc. the first few days at home. Gradually advance to a solid, low-fat, high fiber diet by the end of the first week at home.   Add a fiber supplement to your diet (Metamucil, etc) If you feel full, bloated, or constipated, stay on a full liquid or pureed/blenderized diet for a few days until you feel better and are no longer constipated.   Discharge instructions   Complete by: As directed    See Discharge Instructions If you are not getting better after two weeks or are noticing you are getting worse, contact our office [phone] for further advice.  We may need to adjust your medications, re-evaluate you in the office, send you to the emergency room, or see what other things we can do to help. The clinic staff is available to answer your questions during regular business hours (8:30am-5pm).  Please don't hesitate to call and ask to speak to one of our nurses for clinical concerns.    A surgeon from [facility] is always on call at the hospitals 24 hours/day If you have a medical emergency, go to the nearest emergency room or call 911.   Discharge wound care:   Complete by: As directed    It is good for closed incisions and even open wounds to be washed every day.  Shower every day.  Short baths are fine.  Wash the incisions and wounds clean with soap & water .    You may leave closed incisions open to air if it is dry.   You may cover the incision with clean gauze & replace it after your daily shower for comfort.  DRAIN:  You have a drain in place.  Every day change the dressing in the shower, wash around  the skin exit site with soap & water  and place a new dressing of gauze or band aid around the skin every day.  Keep the drain site clean & dry.  Follow up in our surgery office to discuss plans for drain removal in the near future   Driving Restrictions   Complete by: As directed    You may drive when: - you are no longer taking narcotic prescription pain medication - you can comfortably wear a seatbelt - you can safely make sudden turns/stops without pain.   Increase activity slowly   Complete by: As directed    Start light daily activities --- self-care, walking, climbing stairs- beginning the day after surgery.  Gradually increase activities as tolerated.  Control your pain to be active.  Stop when you are tired.  Ideally, walk several times a day, eventually an hour  a day.   Most people are back to most day-to-day activities in a few weeks.  It takes 4-6 weeks to get back to unrestricted, intense activity. If you can walk 30 minutes without difficulty, it is safe to try more intense activity such as jogging, treadmill, bicycling, low-impact aerobics, swimming, etc. Save the most intensive and strenuous activity for last (Usually 4-8 weeks after surgery) such as sit-ups, heavy lifting, contact sports, etc.  Refrain from any intense heavy lifting or straining until you are off narcotics for pain control.  You will have off days, but things should improve week-by-week. DO NOT PUSH THROUGH PAIN.  Let pain be your guide: If it hurts to do something, don't do it.   Lifting restrictions   Complete by: As directed    If you can walk 30 minutes without difficulty, it is safe to try more intense activity such as jogging, treadmill, bicycling, low-impact aerobics, swimming, etc. Save the most intensive and strenuous activity for last (Usually 4-8 weeks after surgery) such as sit-ups, heavy lifting, contact sports, etc.   Refrain from any intense heavy lifting or straining until you are off narcotics for  pain control.  You will have off days, but things should improve week-by-week. DO NOT PUSH THROUGH PAIN.  Let pain be your guide: If it hurts to do something, don't do it.  Pain is your body warning you to avoid that activity for another week until the pain goes down.   May shower / Bathe   Complete by: As directed    May walk up steps   Complete by: As directed    Sexual Activity Restrictions   Complete by: As directed    You may have sexual intercourse when it is comfortable. If it hurts to do something, stop.       Allergies as of 03/20/2023   No Known Allergies      Medication List     TAKE these medications    acetaminophen  325 MG tablet Commonly known as: TYLENOL  Take 2 tablets (650 mg total) by mouth every 6 (six) hours as needed for mild pain, fever or headache.   albuterol  108 (90 Base) MCG/ACT inhaler Commonly known as: VENTOLIN  HFA TAKE 2 PUFFS BY MOUTH EVERY 6 HOURS AS NEEDED FOR WHEEZE OR SHORTNESS OF BREATH What changed: See the new instructions.   amoxicillin -clavulanate 875-125 MG tablet Commonly known as: AUGMENTIN  Take 1 tablet by mouth 2 (two) times daily.   atorvastatin  10 MG tablet Commonly known as: LIPITOR Take 1 tablet (10 mg total) by mouth daily.   Ensure Take 237 mLs by mouth daily as needed (for supplementation- or, Boost). Or boost   lactose free nutrition Liqd Take 237 mLs by mouth daily as needed (for supplementation- or, Ensure).   glycopyrrolate  1 MG tablet Commonly known as: Robinul  Take 1 tablet (1 mg total) by mouth 2 (two) times daily.   ibuprofen  600 MG tablet Commonly known as: ADVIL  Take 1 tablet (600 mg total) by mouth every 8 (eight) hours as needed.   Magnesium  250 MG Tabs Take 250 mg by mouth in the morning.   omeprazole  40 MG capsule Commonly known as: PRILOSEC TAKE 1 CAPSULE (40 MG TOTAL) BY MOUTH DAILY. What changed: when to take this   traMADol  50 MG tablet Commonly known as: ULTRAM  Take 1-2 tablets (50-100  mg total) by mouth every 8 (eight) hours as needed for severe pain (pain score 7-10) or moderate pain (pain score 4-6) (for pain).  What changed:  how much to take when to take this reasons to take this               Discharge Care Instructions  (From admission, onward)           Start     Ordered   03/20/23 0000  Discharge wound care:       Comments: It is good for closed incisions and even open wounds to be washed every day.  Shower every day.  Short baths are fine.  Wash the incisions and wounds clean with soap & water .    You may leave closed incisions open to air if it is dry.   You may cover the incision with clean gauze & replace it after your daily shower for comfort.  DRAIN:  You have a drain in place.  Every day change the dressing in the shower, wash around the skin exit site with soap & water  and place a new dressing of gauze or band aid around the skin every day.  Keep the drain site clean & dry.  Follow up in our surgery office to discuss plans for drain removal in the near future   03/20/23 0808            Significant Diagnostic Studies:  Results for orders placed or performed during the hospital encounter of 03/16/23 (from the past 72 hours)  Aerobic/Anaerobic Culture w Gram Stain (surgical/deep wound)     Status: None (Preliminary result)   Collection Time: 03/17/23 12:53 PM   Specimen: Abscess  Result Value Ref Range   Specimen Description      ABSCESS Performed at [facility], [address], [place], [place] [address]    Special Requests      NONE Performed at [facility], [address], [place], [place] [address]    Gram Stain      ABUNDANT WBC PRESENT, PREDOMINANTLY PMN RARE GRAM POSITIVE COCCI    Culture      RARE GRAM NEGATIVE RODS FEW STREPTOCOCCUS GROUP F Beta hemolytic streptococci are predictably susceptible to penicillin and other beta lactams. Susceptibility testing not routinely  performed. NO ANAEROBES ISOLATED; CULTURE IN PROGRESS FOR 5 DAYS IDENTIFICATION AND SUSCEPTIBILITIES TO FOLLOW Performed at [facility], [address] [place], [place] [address]    Report Status PENDING   CBC     Status: Abnormal   Collection Time: 03/19/23  4:49 AM  Result Value Ref Range   WBC 5.2 4.0 - 10.5 K/uL   RBC 4.78 4.22 - 5.81 MIL/uL   Hemoglobin 13.2 13.0 - 17.0 g/dL   HCT 58.0 60.9 - 47.9 %   MCV 87.7 80.0 - 100.0 fL   MCH 27.6 26.0 - 34.0 pg   MCHC 31.5 30.0 - 36.0 g/dL   RDW 84.1 (H) 88.4 - 84.4 %   Platelets 222 150 - 400 K/uL   nRBC 0.0 0.0 - 0.2 %    Comment: Performed at [facility], [address] [place], [place] [address]  Potassium     Status: None   Collection Time: 03/19/23  4:49 AM  Result Value Ref Range   Potassium 3.5 3.5 - 5.1 mmol/L    Comment: Performed at [facility], [address], [place], [place] [address]  Creatinine, serum     Status: None   Collection Time: 03/19/23  4:49 AM  Result Value Ref Range   Creatinine, Ser 0.87 0.61 -  1.24 mg/dL   GFR, Estimated >39 >39 mL/min    Comment: (NOTE) Calculated using the CKD-EPI Creatinine Equation (2021) Performed at [facility], [address] [place], [place] [address]     CT GUIDED PERITONEAL/RETROPERITONEAL FLUID DRAIN BY [facility] CATH Result Date: 03/17/2023 INDICATION: 63 year old with a pericolonic abscess in the anterior pelvis. This is most compatible with a diverticular abscess. EXAM: CT-GUIDED PLACEMENT OF DRAIN IN PELVIC ABSCESS TECHNIQUE: Multidetector CT imaging of the pelvis was performed following the standard protocol without IV contrast. RADIATION DOSE REDUCTION: This exam was performed according to the departmental dose-optimization program which includes automated exposure control, adjustment of the mA and/or kV according to patient size and/or use of iterative reconstruction technique. MEDICATIONS: Moderate  sedation ANESTHESIA/SEDATION: Moderate (conscious) sedation was employed during this procedure. A total of Versed  2 mg and Fentanyl  150 mcg was administered intravenously by the radiology nurse. Total intra-service moderate Sedation Time: 32 minutes. The patient's level of consciousness and vital signs were monitored continuously by radiology nursing throughout the procedure under my direct supervision. COMPLICATIONS: None immediate. PROCEDURE: Informed written consent was obtained from the patient after a thorough discussion of the procedural risks, benefits and alternatives. All questions were addressed. Maximal Sterile Barrier Technique was utilized including caps, mask, sterile gowns, sterile gloves, sterile drape, hand hygiene and skin antiseptic. A timeout was performed prior to the initiation of the procedure. Patient was placed supine on the interventional table. Images through the pelvis were obtained. The anterior lower abdomen and pelvis was shaved. This area was prepped with chlorhexidine  and sterile field was created. Skin was anesthetized using 1% lidocaine . Small incision was made. Using CT guidance, an 18 gauge trocar needle was directed into the anterior pelvic air-fluid collection. Superstiff Amplatz wire was advanced into the collection. The tract was dilated to accommodate a 10 French multipurpose drain. Yellow purulent fluid was aspirated. Drain was attached to a suction bulb and sutured in place. Dressing was placed. FINDINGS: Air-fluid collection just anterior to the sigmoid colon and adjacent to the bladder in the anterior pelvis. Yellow purulent fluid aspirated from the collection. Drain is reconstituted within the collection at the end of the procedure. Inflammatory changes around the sigmoid colon compatible with acute diverticulitis. IMPRESSION: CT-guided placement of a drainage catheter in the pericolonic abscess. Electronically Signed   By: Juliene Balder M.D.   On: 03/17/2023 16:14   CT  ABDOMEN PELVIS W CONTRAST Result Date: 03/16/2023 CLINICAL DATA:  Left lower quadrant pain EXAM: CT ABDOMEN AND PELVIS WITH CONTRAST TECHNIQUE: Multidetector CT imaging of the abdomen and pelvis was performed using the standard protocol following bolus administration of intravenous contrast. RADIATION DOSE REDUCTION: This exam was performed according to the departmental dose-optimization program which includes automated exposure control, adjustment of the mA and/or kV according to patient size and/or use of iterative reconstruction technique. CONTRAST:  OMNIPAQUE  IOHEXOL  300 MG/ML  SOLN COMPARISON:  CT 12/07/2022 and older. FINDINGS: Lower chest: There is some linear opacity lung bases bilaterally likely scar or atelectasis, similar to previous. There is also some emphysematous changes identified at the bases. No pleural effusion. Hepatobiliary: Patent portal vein. There are scattered tiny low-attenuation lesion seen in the liver which are too small to completely characterize but likely benign cystic foci and are unchanged from previous examination. Gallbladder is nondilated. Pancreas: Mild ectasia of the pancreatic duct measuring up 3 mm on coronal imaging. Similar to previous in retrospect Spleen: Normal in size without focal abnormality. Adrenals/Urinary Tract: Adrenal  glands are unremarkable. No enhancing renal mass or collecting system dilatation. Bladder is nondilated but there is some wall thickening along the superior aspect of the bladder. Mild abnormal enhancement. Stomach/Bowel: There is wall thickening along the sigmoid colon with significant stranding and diverticula. There is a bilobed extraluminal fluid collection with an air-fluid level extending inferior to the sigmoid colon and abutting the superior aspect of the urinary bladder. Overall this area measures 4.6 by 3.2 by 2.5 cm. This could represent diverticulitis with a small abscess. There is also a small mural area of air and fluid along  the sigmoid colon just proximal on series 2, image 55. No associated obstruction. No widespread free air. Normal appendix in the right lower quadrant. Scattered colonic stool. The stomach and small bowel are nondilated on this non oral contrast examination. Vascular/Lymphatic: Aortic atherosclerosis. No enlarged abdominal or pelvic lymph nodes. Reproductive: Prostate is unremarkable. Other: No free intra-air. Musculoskeletal: Degenerative changes along the lower lumbar spine particularly L4-5 and L5-S1 with disc bulging and encroachment. IMPRESSION: Wall thickening and inflammatory changes along the sigmoid colon consistent with diverticulitis. There is what appears to be an extraluminal abscess extending caudal to the sigmoid colon measuring 4.6 x 3.2 cm. This is immediately adjacent to the dome of the bladder with some bladder wall thickening and adjacent stranding. Additional smaller intramural abscess suggested along the course of the sigmoid colon just proximal to this. Please correlate clinical presentation and recommend follow up after treatment to confirm resolution and exclude secondary pathology. Stable mild ectasia of the pancreatic duct without focal pancreatic atrophy or obvious pancreatic mass. Electronically Signed   By: Ranell Bring M.D.   On: 03/16/2023 13:00    Past Medical History:  Diagnosis Date   Arthritis    Colon polyp    Diverticulosis    Refusal of blood transfusions as patient is Jehovah's Witness     Past Surgical History:  Procedure Laterality Date   COLONOSCOPY     KNEE ARTHROSCOPY WITH MEDIAL MENISECTOMY Right 02/05/2020   Procedure: Right knee arthroscopy with partial medial and lateral menisectomy, chondroplasty, partial synovectomy, removal loose body;  Surgeon: Leora Lynwood SAUNDERS, MD;  Location: [facility] ORS;  Service: Orthopedics;  Laterality: Right;   MENISCUS REPAIR Left 01/2014   TOOTH EXTRACTION     UPPER GASTROINTESTINAL ENDOSCOPY  05/29/2021    Social History    Socioeconomic History   Marital status: Single    Spouse name: Not on file   Number of children: 1   Years of education: Not on file   Highest education level: Not on file  Occupational History   Occupation: owner/operator  Tobacco Use   Smoking status: Former    Current packs/day: 0.00    Types: Cigarettes    Quit date: 05/28/2018    Years since quitting: 4.8   Smokeless tobacco: Never  Vaping Use   Vaping status: Never Used  Substance and Sexual Activity   Alcohol use: Not Currently    Alcohol/week: 1.0 standard drink of alcohol    Types: 1 Cans of beer per week    Comment: a few times a year   Drug use: Never   Sexual activity: Not Currently    Birth control/protection: Condom  Other Topics Concern   Not on file  Social History Narrative   05/22/21   From: the area   Living: alone   Work: teacher, adult education      Family: family is all over the place, has cousins which  are local in [place]      Enjoys: learning to play guitar, DJ, music, aviation      Exercise: knee/back pain limit this   Diet: could be better - avoids red meats/pork      Safety   Seat belts: Yes    Guns: No   Safe in relationships: Yes       Daughter: 38   Social Drivers of Corporate Investment Banker Strain: Not on file  Food Insecurity: No Food Insecurity (03/16/2023)   Hunger Vital Sign    Worried About Running Out of Food in the Last Year: Never true    Ran Out of Food in the Last Year: Never true  Transportation Needs: No Transportation Needs (03/16/2023)   PRAPARE - Administrator, Civil Service (Medical): No    Lack of Transportation (Non-Medical): No  Physical Activity: Not on file  Stress: Not on file  Social Connections: Not on file  Intimate Partner Violence: Not At Risk (03/16/2023)   Humiliation, Afraid, Rape, and Kick questionnaire    Fear of Current or Ex-Partner: No    Emotionally Abused: No    Physically Abused: No    Sexually Abused:  No    Family History  Problem Relation Age of Onset   Breast cancer Mother    Colon cancer Mother 45   Colon polyps Mother    Lymphoma Mother    Heart disease Father    Heart failure Father    Heart attack Father 52   Hypertension Sister    Hyperlipidemia Sister    Asthma Sister    Aneurysm Sister    Hypertension Brother    Hyperlipidemia Brother    Deep vein thrombosis Brother    Heart attack Brother        50-60   Hypertension Maternal Grandmother    Diabetes Maternal Grandmother    Hypertension Maternal Aunt    Cancer Maternal Aunt        type unknown   Hypertension Maternal Uncle    Liver cancer Maternal Uncle    Hypertension Paternal Aunt    Hypertension Paternal Uncle    Esophageal cancer Neg Hx    Rectal cancer Neg Hx    Stomach cancer Neg Hx     Current Facility-Administered Medications  Medication Dose Route Frequency Provider Last Rate Last Admin   0.9 %  sodium chloride  infusion  250 mL Intravenous PRN Sheldon Standing, MD       acetaminophen  (TYLENOL ) tablet 1,000 mg  1,000 mg Oral Q6H Sheldon Standing, MD   1,000 mg at 03/20/23 0539   albuterol  (PROVENTIL ) (2.5 MG/3ML) 0.083% nebulizer solution 2.5 mg  2.5 mg Inhalation Q6H PRN Sheldon Standing, MD       alum & mag hydroxide-simeth (MAALOX/MYLANTA) 200-200-20 MG/5ML suspension 30 mL  30 mL Oral Q6H PRN Sheldon Standing, MD       diphenhydrAMINE  (BENADRYL ) 12.5 MG/5ML elixir 12.5 mg  12.5 mg Oral Q6H PRN Thomas, Alicia, MD       Or   diphenhydrAMINE  (BENADRYL ) injection 12.5 mg  12.5 mg Intravenous Q6H PRN Thomas, Alicia, MD       enoxaparin  (LOVENOX ) injection 40 mg  40 mg Subcutaneous Q24H Thomas, Alicia, MD   40 mg at 03/19/23 2137   glycopyrrolate  (ROBINUL ) tablet 1 mg  1 mg Oral BID Sheldon Standing, MD   1 mg at 03/20/23 0837   HYDROmorphone  (DILAUDID ) injection 1 mg  1 mg Intravenous Q2H PRN Tsuei,  Donnice, MD   1 mg at 03/20/23 9390   lactated ringers  bolus 1,000 mL  1,000 mL Intravenous Q8H PRN Sheldon Standing, MD        magic mouthwash  15 mL Oral QID PRN Sheldon Standing, MD       magnesium  oxide (MAG-OX) tablet 200 mg  200 mg Oral Daily Sheldon Standing, MD   200 mg at 03/20/23 9162   menthol -cetylpyridinium (CEPACOL) lozenge 3 mg  1 lozenge Oral PRN Sheldon Standing, MD       methocarbamol  (ROBAXIN ) injection 1,000 mg  1,000 mg Intravenous Q6H PRN Sheldon Standing, MD       methocarbamol  (ROBAXIN ) tablet 1,000 mg  1,000 mg Oral Q6H PRN Sheldon Standing, MD       metoprolol  tartrate (LOPRESSOR ) injection 5 mg  5 mg Intravenous Q6H PRN Sheldon Standing, MD       naphazoline-glycerin  (CLEAR EYES REDNESS) ophth solution 1-2 drop  1-2 drop Both Eyes QID PRN Sheldon Standing, MD       ondansetron  (ZOFRAN ) injection 4 mg  4 mg Intravenous Q6H PRN Sheldon Standing, MD   4 mg at 03/18/23 1903   ondansetron  (ZOFRAN -ODT) disintegrating tablet 4 mg  4 mg Oral Q6H PRN Thomas, Alicia, MD       oxyCODONE  (Oxy IR/ROXICODONE ) immediate release tablet 5-10 mg  5-10 mg Oral Q4H PRN Sheldon Standing, MD       pantoprazole  (PROTONIX ) EC tablet 80 mg  80 mg Oral Daily Sheldon Standing, MD   80 mg at 03/20/23 0837   phenol (CHLORASEPTIC) mouth spray 2 spray  2 spray Mouth/Throat PRN Sheldon Standing, MD       piperacillin -tazobactam (ZOSYN ) IVPB 3.375 g  3.375 g Intravenous Q8H Ilka Lovick, MD 12.5 mL/hr at 03/20/23 0540 3.375 g at 03/20/23 0540   potassium chloride  SA (KLOR-CON  M) CR tablet 40 mEq  40 mEq Oral Daily Sheldon Standing, MD   40 mEq at 03/20/23 9162   prochlorperazine  (COMPAZINE ) injection 5-10 mg  5-10 mg Intravenous Q4H PRN Sheldon Standing, MD   5 mg at 03/18/23 2137   simethicone  (MYLICON) chewable tablet 40 mg  40 mg Oral Q6H PRN Thomas, Alicia, MD       sodium chloride  (OCEAN) 0.65 % nasal spray 1-2 spray  1-2 spray Each Nare Q6H PRN Sheldon Standing, MD       sodium chloride  flush (NS) 0.9 % injection 3 mL  3 mL Intravenous Q12H Lark Runk, MD   3 mL at 03/19/23 2136   sodium chloride  flush (NS) 0.9 % injection 3 mL  3 mL Intravenous PRN Sheldon Standing, MD       sodium chloride  flush (NS) 0.9 % injection 5 mL  5 mL Intracatheter Q8H [name], MD   5 mL at 03/20/23 0539   traMADol  (ULTRAM ) tablet 50-100 mg  50-100 mg Oral Q6H PRN Sheldon Standing, MD         No Known Allergies  Signed:   Standing KYM Sheldon, MD, FACS, MASCRS Esophageal, Gastrointestinal & Colorectal Surgery Robotic and Minimally Invasive Surgery  [facility] [address], [address] [place], [place] [address] [phone] Fax [phone] Main  CONTACT INFORMATION: Weekday (9AM-5PM): Call [facility] main office at [phone] Weeknight (5PM-9AM) or Weekend/Holiday: Check EPIC Web Links tab & use AMION (password  [email or web address]) for General Surgery [facility] coverage  Please, DO NOT use SecureChat  (it is not reliable communication to reach operating surgeons & will  lead to a delay in care).   Epic staff messaging available for outptient concerns needing 1-2 business day response.      03/20/2023, 9:04 AM

## 2023-03-21 LAB — CULTURE, BLOOD (ROUTINE X 2)
Culture: NO GROWTH
Culture: NO GROWTH

## 2023-03-22 ENCOUNTER — Other Ambulatory Visit: Payer: Self-pay | Admitting: Surgery

## 2023-03-22 ENCOUNTER — Other Ambulatory Visit (HOSPITAL_COMMUNITY): Payer: Self-pay

## 2023-03-22 ENCOUNTER — Telehealth: Payer: Self-pay

## 2023-03-22 ENCOUNTER — Encounter: Payer: Self-pay | Admitting: Family

## 2023-03-22 DIAGNOSIS — K5720 Diverticulitis of large intestine with perforation and abscess without bleeding: Secondary | ICD-10-CM

## 2023-03-22 LAB — AEROBIC/ANAEROBIC CULTURE W GRAM STAIN (SURGICAL/DEEP WOUND)

## 2023-03-22 NOTE — Transitions of Care (Post Inpatient/ED Visit) (Signed)
   03/22/2023  Name: Marcus Andrews MRN: [id number] DOB: 01-24-1961  Today's TOC FU Call Status: Today's TOC FU Call Status:: Unsuccessful Call (1st Attempt) Unsuccessful Call (1st Attempt) Date: 03/22/23  Attempted to reach the patient regarding the most recent Inpatient/ED visit.  Follow Up Plan: Additional outreach attempts will be made to reach the patient to complete the Transitions of Care (Post Inpatient/ED visit) call.   Medford Balboa, RN Medical Illustrator [facility] [phone]

## 2023-03-22 NOTE — Transitions of Care (Post Inpatient/ED Visit) (Signed)
 03/22/2023  Name: Marcus Andrews MRN: [id number] DOB: 1960-06-20  Today's TOC FU Call Status: Today's TOC FU Call Status:: Successful TOC FU Call Completed TOC FU Call Complete Date: 03/22/23 Patient's Name and Date of Birth confirmed.  Transition Care Management Follow-up Telephone Call Date of Discharge: 03/20/23 Discharge Facility: [facility]) Type of Discharge: Inpatient Admission Primary Inpatient Discharge Diagnosis:: Diverticulitis with perforation How have you been since you were released from the hospital?: Better Any questions or concerns?: No  Items Reviewed: Did you receive and understand the discharge instructions provided?: Yes Medications obtained,verified, and reconciled?: Yes (Medications Reviewed) Any new allergies since your discharge?: No Dietary orders reviewed?: Yes Type of Diet Ordered:: Diet restrictions due to Diverticulitis Do you have support at home?: No  Medications Reviewed Today: Medications Reviewed Today     Reviewed by Moises Reusing, RN (Case Manager) on 03/22/23 at 1517  Med List Status: <None>   Medication Order Taking? Sig Documenting Provider Last Dose Status Informant  acetaminophen  (TYLENOL ) 325 MG tablet [id number] No Take 2 tablets (650 mg total) by mouth every 6 (six) hours as needed for mild pain, fever or headache. Danton Reyes DASEN, MD Unknown Active Self  albuterol  (VENTOLIN  HFA) 108 (90 Base) MCG/ACT inhaler [id number] No TAKE 2 PUFFS BY MOUTH EVERY 6 HOURS AS NEEDED FOR WHEEZE OR SHORTNESS OF BREATH  Patient taking differently: Inhale 2 puffs into the lungs every 6 (six) hours as needed for shortness of breath or wheezing.   Corwin Antu, FNP Unknown Active Self  amoxicillin -clavulanate (AUGMENTIN ) 875-125 MG tablet [id number]  Take 1 tablet by mouth 2 (two) times daily. Sheldon Standing, MD  Active   atorvastatin  (LIPITOR) 10 MG tablet [id number] No Take 1 tablet (10 mg total) by mouth daily.  Patient not taking: Reported  on 03/16/2023   Corwin Antu, FNP Not Taking Active Self  Ensure [facility]) [id number] No Take 237 mLs by mouth daily as needed (for supplementation- or, Boost). Or boost [provider] Past Week Active Self  glycopyrrolate  (ROBINUL ) 1 MG tablet [id number] No Take 1 tablet (1 mg total) by mouth 2 (two) times daily. Aneita Gwendlyn DASEN, MD 03/15/2023 Active Self  ibuprofen  (ADVIL ) 600 MG tablet [id number] No Take 1 tablet (600 mg total) by mouth every 8 (eight) hours as needed.  Patient not taking: Reported on 03/08/2023   Corwin Antu, FNP Not Taking Active Self  lactose free nutrition (BOOST) LIQD [id number] No Take 237 mLs by mouth daily as needed (for supplementation- or, Ensure). [provider] Past Week Active Self  Magnesium  250 MG TABS [id number] No Take 250 mg by mouth in the morning. [provider] 03/16/2023 Morning Active Self  omeprazole  (PRILOSEC) 40 MG capsule [id number] No TAKE 1 CAPSULE (40 MG TOTAL) BY MOUTH DAILY.  Patient taking differently: Take 40 mg by mouth daily before breakfast.   Aneita Gwendlyn DASEN, MD 03/15/2023 Active Self  sodium chloride  flush 0.9 % SOLN injection [id number]  Use to flush abdominal drain with 5 mL sterile saline once daily   Active   traMADol  (ULTRAM ) 50 MG tablet [id number]  Take 1-2 tablets (50-100 mg total) by mouth every 8 (eight) hours as needed for severe pain (pain score 7-10) or moderate pain (pain score 4-6) (for pain). Sheldon Standing, MD  Active             Home Care and Equipment/Supplies: Were Home Health Services Ordered?: NA Any new equipment or medical supplies ordered?: NA  Functional Questionnaire: Do you  need assistance with bathing/showering or dressing?: No Do you need assistance with meal preparation?: No Do you need assistance with eating?: No Do you have difficulty maintaining continence: No Do you need assistance with getting out of bed/getting out of a chair/moving?: No Do you have difficulty  managing or taking your medications?: No  Follow up appointments reviewed: PCP Follow-up appointment confirmed?: NA Specialist Hospital Follow-up appointment confirmed?: Yes Date of Specialist follow-up appointment?: 03/25/23 Follow-Up Specialty Provider:: GI provider for drain study Do you need transportation to your follow-up appointment?: No Do you understand care options if your condition(s) worsen?: Yes-patient verbalized understanding  SDOH Interventions Today    Flowsheet Row Most Recent Value  SDOH Interventions   Food Insecurity Interventions Intervention Not Indicated  Housing Interventions Intervention Not Indicated  Transportation Interventions Intervention Not Indicated  Utilities Interventions Intervention Not Indicated      Interventions Today    Flowsheet Row Most Recent Value  Chronic Disease   Chronic disease during today's visit Other  [Diverticulitis]  General Interventions   General Interventions Discussed/Reviewed General Interventions Discussed, Doctor Visits  Doctor Visits Discussed/Reviewed Doctor Visits Discussed, PCP, Specialist  Exercise Interventions   Exercise Discussed/Reviewed Physical Activity  Physical Activity Discussed/Reviewed Physical Activity Discussed, Types of exercise  Education Interventions   Education Provided Provided Education  Provided Verbal Education On Nutrition, Medication, When to see the doctor, Insurance Plans  Nutrition Interventions   Nutrition Discussed/Reviewed Nutrition Discussed, Supplemental nutrition      TOC Outreach completed today. The patient has no questions or concerns. He has a drain from the abscess from Diverticulitis and he is comfortable taking care of it. No Home Health needs. He declines PCP follow up at this time. Reviewed nutrition including foods to avoid and taking the nutritional supplements.   The patient has been provided with contact information for the care management team and has been  advised to call with any health-related questions or concerns. The patient verbalized understanding with current POC. The patient is directed to their insurance card regarding availability of benefits coverage. Medford Balboa, RN Medical Illustrator [facility]Population [facility] [phone]

## 2023-03-22 NOTE — Progress Notes (Signed)
 Abscess came back with positive bacteria. Can we confirm with patient that he is taking augmentin post discharge?

## 2023-03-25 ENCOUNTER — Other Ambulatory Visit (HOSPITAL_COMMUNITY): Payer: Self-pay

## 2023-03-25 ENCOUNTER — Ambulatory Visit: Admission: RE | Admit: 2023-03-25 | Payer: 59 | Source: Ambulatory Visit

## 2023-03-25 ENCOUNTER — Ambulatory Visit: Payer: 59

## 2023-03-26 ENCOUNTER — Other Ambulatory Visit (HOSPITAL_COMMUNITY): Payer: Self-pay

## 2023-03-30 ENCOUNTER — Other Ambulatory Visit: Payer: 59

## 2023-03-30 ENCOUNTER — Ambulatory Visit
Admission: RE | Admit: 2023-03-30 | Discharge: 2023-03-30 | Disposition: A | Discharge status: Home / Self Care | Payer: 59 | Source: Ambulatory Visit | Attending: Surgery | Admitting: Surgery

## 2023-03-30 ENCOUNTER — Ambulatory Visit
Admission: RE | Admit: 2023-03-30 | Discharge: 2023-03-30 | Disposition: A | Discharge status: Home / Self Care | Payer: 59 | Source: Ambulatory Visit | Attending: Radiology | Admitting: Radiology

## 2023-03-30 DIAGNOSIS — K5720 Diverticulitis of large intestine with perforation and abscess without bleeding: Secondary | ICD-10-CM

## 2023-03-30 HISTORY — PX: IR RADIOLOGIST EVAL & MGMT: IMG5224

## 2023-03-30 MED ORDER — IOPAMIDOL (ISOVUE-300) INJECTION 61%
100.0000 mL | Freq: Once | INTRAVENOUS | Status: AC | PRN
  Administered 2023-03-30: 100 mL via INTRAVENOUS

## 2023-03-30 NOTE — Progress Notes (Signed)
 Chief Complaint: Patient was seen in consultation today for abscess drain care at the request of Allred,Darrell K  Referring Physician(s): Allred,Darrell K  History of Present Illness: Marcus Andrews is a 63 y.o. male with a history of sigmoid colonic diverticulosis complicated by microperforation and abscess formation.  On the first of the year, he underwent percutaneous drain placement by my partner, Dr. Philip.  Since then he has had significant improvement.  He reports that his drain output is 5 mL or less daily of clear fluid.  This is commensurate with the amount of saline flush he is injecting into the drainage catheter.  He has no pain, nausea, vomiting or diarrhea.  No fever or chills.  No purulent or foul-smelling discharge.  Past Medical History:  Diagnosis Date   Arthritis    Colon polyp    Diverticulosis    Refusal of blood transfusions as patient is Jehovah's Witness     Past Surgical History:  Procedure Laterality Date   COLONOSCOPY     IR RADIOLOGIST EVAL & MGMT  03/30/2023   KNEE ARTHROSCOPY WITH MEDIAL MENISECTOMY Right 02/05/2020   Procedure: Right knee arthroscopy with partial medial and lateral menisectomy, chondroplasty, partial synovectomy, removal loose body;  Surgeon: Leora Lynwood JONELLE, MD;  Location: [facility];  Service: Orthopedics;  Laterality: Right;   MENISCUS REPAIR Left 01/2014   TOOTH EXTRACTION     UPPER GASTROINTESTINAL ENDOSCOPY  05/29/2021    Allergies: Patient has no known allergies.  Medications: Prior to Admission medications   Medication Sig Start Date End Date Taking? Authorizing Provider  acetaminophen  (TYLENOL ) 325 MG tablet Take 2 tablets (650 mg total) by mouth every 6 (six) hours as needed for mild pain, fever or headache. 12/09/22   Danton Reyes DASEN, MD  albuterol  (VENTOLIN  HFA) 108 (90 Base) MCG/ACT inhaler TAKE 2 PUFFS BY MOUTH EVERY 6 HOURS AS NEEDED FOR WHEEZE OR SHORTNESS OF BREATH Patient taking differently: Inhale 2  puffs into the lungs every 6 (six) hours as needed for shortness of breath or wheezing. 09/24/22   Corwin Antu, FNP  amoxicillin -clavulanate (AUGMENTIN ) 875-125 MG tablet Take 1 tablet by mouth 2 (two) times daily. 03/20/23   Sheldon Standing, MD  atorvastatin  (LIPITOR) 10 MG tablet Take 1 tablet (10 mg total) by mouth daily. Patient not taking: Reported on 03/16/2023 07/23/22   Corwin Antu, FNP  Ensure (ENSURE) Take 237 mLs by mouth daily as needed (for supplementation- or, Boost). Or boost    [provider]  glycopyrrolate  (ROBINUL ) 1 MG tablet Take 1 tablet (1 mg total) by mouth 2 (two) times daily. 04/21/22   Aneita Gwendlyn DASEN, MD  ibuprofen  (ADVIL ) 600 MG tablet Take 1 tablet (600 mg total) by mouth every 8 (eight) hours as needed. Patient not taking: Reported on 03/08/2023 08/26/22   Dugal, Tabitha, FNP  lactose free nutrition (BOOST) LIQD Take 237 mLs by mouth daily as needed (for supplementation- or, Ensure).    [provider]  Magnesium  250 MG TABS Take 250 mg by mouth in the morning.    [provider]  omeprazole  (PRILOSEC) 40 MG capsule TAKE 1 CAPSULE (40 MG TOTAL) BY MOUTH DAILY. Patient taking differently: Take 40 mg by mouth daily before breakfast. 02/02/23   Aneita Gwendlyn DASEN, MD  sodium chloride  flush 0.9 % SOLN injection Use to flush abdominal drain with 5 mL sterile saline once daily 03/19/23     traMADol  (ULTRAM ) 50 MG tablet Take 1-2 tablets (50-100 mg total) by  mouth every 8 (eight) hours as needed for severe pain (pain score 7-10) or moderate pain (pain score 4-6) (for pain). 03/20/23   Sheldon Standing, MD     Family History  Problem Relation Age of Onset   Breast cancer Mother    Colon cancer Mother 52   Colon polyps Mother    Lymphoma Mother    Heart disease Father    Heart failure Father    Heart attack Father 81   Hypertension Sister    Hyperlipidemia Sister    Asthma Sister    Aneurysm Sister    Hypertension Brother    Hyperlipidemia Brother     Deep vein thrombosis Brother    Heart attack Brother        50-60   Hypertension Maternal Grandmother    Diabetes Maternal Grandmother    Hypertension Maternal Aunt    Cancer Maternal Aunt        type unknown   Hypertension Maternal Uncle    Liver cancer Maternal Uncle    Hypertension Paternal Aunt    Hypertension Paternal Uncle    Esophageal cancer Neg Hx    Rectal cancer Neg Hx    Stomach cancer Neg Hx     Social History   Socioeconomic History   Marital status: Single    Spouse name: Not on file   Number of children: 1   Years of education: Not on file   Highest education level: Not on file  Occupational History   Occupation: owner/operator  Tobacco Use   Smoking status: Former    Current packs/day: 0.00    Types: Cigarettes    Quit date: 05/28/2018    Years since quitting: 4.8   Smokeless tobacco: Never  Vaping Use   Vaping status: Never Used  Substance and Sexual Activity   Alcohol use: Not Currently    Alcohol/week: 1.0 standard drink of alcohol    Types: 1 Cans of beer per week    Comment: a few times a year   Drug use: Never   Sexual activity: Not Currently    Birth control/protection: Condom  Other Topics Concern   Not on file  Social History Narrative   05/22/21   From: the area   Living: alone   Work: teacher, adult education      Family: family is all over the place, has cousins which are local in [place]      Enjoys: learning to play guitar, DJ, music, counsellor      Exercise: knee/back pain limit this   Diet: could be better - avoids red meats/pork      Safety   Seat belts: Yes    Guns: No   Safe in relationships: Yes       Daughter: 35   Social Drivers of Corporate Investment Banker Strain: Not on file  Food Insecurity: No Food Insecurity (03/22/2023)   Hunger Vital Sign    Worried About Running Out of Food in the Last Year: Never true    Ran Out of Food in the Last Year: Never true  Transportation Needs: No  Transportation Needs (03/22/2023)   PRAPARE - Administrator, Civil Service (Medical): No    Lack of Transportation (Non-Medical): No  Physical Activity: Not on file  Stress: Not on file  Social Connections: Not on file    Review of Systems: A 12 point ROS discussed and pertinent positives are indicated in the HPI above.  All other systems are  negative.  Review of Systems  Vital Signs: There were no vitals taken for this visit.    Physical Exam Constitutional:      Appearance: Normal appearance.  HENT:     Head: Normocephalic and atraumatic.  Eyes:     General: No scleral icterus. Cardiovascular:     Rate and Rhythm: Normal rate.  Pulmonary:     Effort: Pulmonary effort is normal.  Abdominal:     General: Abdomen is flat. There is no distension.     Palpations: Abdomen is soft.     Tenderness: There is no abdominal tenderness.       Comments: Drain insertion site is clean and dry  Skin:    General: Skin is warm and dry.  Neurological:     Mental Status: He is alert and oriented to person, place, and time.  Psychiatric:        Mood and Affect: Mood normal.        Behavior: Behavior normal.       Imaging: DG Sinus/Fist Tube Chk-Non GI Result Date: 03/30/2023 INDICATION: 63 year old male with a history of diverticular abscess. A percutaneous drain was placed on 03/17/2023. He is now doing well clinically. Drain output is nil. EXAM: Drain injection Fluoroscopy: Radiation exposure index: 0.4 mGy reference air kerma MEDICATIONS: None. ANESTHESIA/SEDATION: None. COMPLICATIONS: None immediate. PROCEDURE: Informed written consent was obtained from the patient after a thorough discussion of the procedural risks, benefits and alternatives. All questions were addressed. Maximal Sterile Barrier Technique was utilized including caps, mask, sterile gowns, sterile gloves, sterile drape, hand hygiene and skin antiseptic. A timeout was performed prior to the initiation of the  procedure. A gentle hand injection of contrast material was performed. The contrast opacifies a collapsed abscess cavity. No evidence of fistulous communication with the adjacent colon. The injected contrast was aspirated. The retention suture was cut and removed. The drain was transected and gently removed. A bandage was applied. IMPRESSION: 1. Negative drain injection. No evidence of residual abscess or fistula. 2. The drain was removed. Electronically Signed   By: Wilkie Lent M.D.   On: 03/30/2023 12:12   CT ABDOMEN PELVIS W CONTRAST Result Date: 03/30/2023 CLINICAL DATA:  63 year old male with a history of diverticulitis complicated by diverticular abscess. A percutaneous drainage catheter was placed on 03/17/2023 EXAM: CT ABDOMEN AND PELVIS WITH CONTRAST TECHNIQUE: Multidetector CT imaging of the abdomen and pelvis was performed using the standard protocol following bolus administration of intravenous contrast. RADIATION DOSE REDUCTION: This exam was performed according to the departmental dose-optimization program which includes automated exposure control, adjustment of the mA and/or kV according to patient size and/or use of iterative reconstruction technique. CONTRAST:  ISOVUE -300 IOPAMIDOL  (ISOVUE -300) INJECTION 61% COMPARISON:  CT scan of the abdomen and pelvis 03/16/2023 FINDINGS: Lower chest: Paraseptal pulmonary emphysema.  No acute abnormality. Hepatobiliary: No focal liver abnormality is seen. No gallstones, gallbladder wall thickening, or biliary dilatation. Pancreas: Unremarkable. No pancreatic ductal dilatation or surrounding inflammatory changes. Spleen: Normal in size without focal abnormality. Adrenals/Urinary Tract: Adrenal glands are unremarkable. Kidneys are normal, without renal calculi, focal lesion, or hydronephrosis. Bladder is unremarkable. Stomach/Bowel: Extensive sigmoid colonic diverticulosis with decreased inflammation. A giant diverticulum is still visible along the  left pelvic sidewall, however there is decreased inflammation. Percutaneous drainage catheter in good position. No undrained abscess is evident. Normal appendix in the right lower quadrant. Vascular/Lymphatic: No aneurysm. Scattered atherosclerotic vascular calcifications. No suspicious lymphadenopathy. Reproductive: Prostate is unremarkable. Other: Well-positioned left anterior abdominal  percutaneous drainage catheter. No ascites. No hernia. Musculoskeletal: Lower lumbar degenerative disc disease. IMPRESSION: 1. Resolution of pericolonic abscess catheter with well-positioned drain in place. No new or undrained abscess is identified. 2. Sigmoid diverticulosis as above without evidence of active inflammation. 3. Aortic Atherosclerosis (ICD10-I70.0) and Emphysema (ICD10-J43.9). Electronically Signed   By: Wilkie Lent M.D.   On: 03/30/2023 12:10   IR Radiologist Eval & Mgmt Result Date: 03/30/2023 EXAM: NEW PATIENT OFFICE VISIT CHIEF COMPLAINT: SEE NOTE IN EPIC HISTORY OF PRESENT ILLNESS: SEE NOTE IN EPIC REVIEW OF SYSTEMS: SEE NOTE IN EPIC PHYSICAL EXAMINATION: SEE NOTE IN EPIC ASSESSMENT AND PLAN: SEE NOTE IN EPIC Electronically Signed   By: Wilkie Lent M.D.   On: 03/30/2023 11:53   CT GUIDED PERITONEAL/RETROPERITONEAL FLUID DRAIN BY PERC CATH Result Date: 03/17/2023 INDICATION: 63 year old with a pericolonic abscess in the anterior pelvis. This is most compatible with a diverticular abscess. EXAM: CT-GUIDED PLACEMENT OF DRAIN IN PELVIC ABSCESS TECHNIQUE: Multidetector CT imaging of the pelvis was performed following the standard protocol without IV contrast. RADIATION DOSE REDUCTION: This exam was performed according to the departmental dose-optimization program which includes automated exposure control, adjustment of the mA and/or kV according to patient size and/or use of iterative reconstruction technique. MEDICATIONS: Moderate sedation ANESTHESIA/SEDATION: Moderate (conscious) sedation was  employed during this procedure. A total of Versed  2 mg and Fentanyl  150 mcg was administered intravenously by the radiology nurse. Total intra-service moderate Sedation Time: 32 minutes. The patient's level of consciousness and vital signs were monitored continuously by radiology nursing throughout the procedure under my direct supervision. COMPLICATIONS: None immediate. PROCEDURE: Informed written consent was obtained from the patient after a thorough discussion of the procedural risks, benefits and alternatives. All questions were addressed. Maximal Sterile Barrier Technique was utilized including caps, mask, sterile gowns, sterile gloves, sterile drape, hand hygiene and skin antiseptic. A timeout was performed prior to the initiation of the procedure. Patient was placed supine on the interventional table. Images through the pelvis were obtained. The anterior lower abdomen and pelvis was shaved. This area was prepped with chlorhexidine  and sterile field was created. Skin was anesthetized using 1% lidocaine . Small incision was made. Using CT guidance, an 18 gauge trocar needle was directed into the anterior pelvic air-fluid collection. Superstiff Amplatz wire was advanced into the collection. The tract was dilated to accommodate a 10 French multipurpose drain. Yellow purulent fluid was aspirated. Drain was attached to a suction bulb and sutured in place. Dressing was placed. FINDINGS: Air-fluid collection just anterior to the sigmoid colon and adjacent to the bladder in the anterior pelvis. Yellow purulent fluid aspirated from the collection. Drain is reconstituted within the collection at the end of the procedure. Inflammatory changes around the sigmoid colon compatible with acute diverticulitis. IMPRESSION: CT-guided placement of a drainage catheter in the pericolonic abscess. Electronically Signed   By: Juliene Balder M.D.   On: 03/17/2023 16:14   CT ABDOMEN PELVIS W CONTRAST Result Date: 03/16/2023 CLINICAL  DATA:  Left lower quadrant pain EXAM: CT ABDOMEN AND PELVIS WITH CONTRAST TECHNIQUE: Multidetector CT imaging of the abdomen and pelvis was performed using the standard protocol following bolus administration of intravenous contrast. RADIATION DOSE REDUCTION: This exam was performed according to the departmental dose-optimization program which includes automated exposure control, adjustment of the mA and/or kV according to patient size and/or use of iterative reconstruction technique. CONTRAST:  OMNIPAQUE  IOHEXOL  300 MG/ML  SOLN COMPARISON:  CT 12/07/2022 and older. FINDINGS: Lower chest: There is  some linear opacity lung bases bilaterally likely scar or atelectasis, similar to previous. There is also some emphysematous changes identified at the bases. No pleural effusion. Hepatobiliary: Patent portal vein. There are scattered tiny low-attenuation lesion seen in the liver which are too small to completely characterize but likely benign cystic foci and are unchanged from previous examination. Gallbladder is nondilated. Pancreas: Mild ectasia of the pancreatic duct measuring up 3 mm on coronal imaging. Similar to previous in retrospect Spleen: Normal in size without focal abnormality. Adrenals/Urinary Tract: Adrenal glands are unremarkable. No enhancing renal mass or collecting system dilatation. Bladder is nondilated but there is some wall thickening along the superior aspect of the bladder. Mild abnormal enhancement. Stomach/Bowel: There is wall thickening along the sigmoid colon with significant stranding and diverticula. There is a bilobed extraluminal fluid collection with an air-fluid level extending inferior to the sigmoid colon and abutting the superior aspect of the urinary bladder. Overall this area measures 4.6 by 3.2 by 2.5 cm. This could represent diverticulitis with a small abscess. There is also a small mural area of air and fluid along the sigmoid colon just proximal on series 2, image 55. No  associated obstruction. No widespread free air. Normal appendix in the right lower quadrant. Scattered colonic stool. The stomach and small bowel are nondilated on this non oral contrast examination. Vascular/Lymphatic: Aortic atherosclerosis. No enlarged abdominal or pelvic lymph nodes. Reproductive: Prostate is unremarkable. Other: No free intra-air. Musculoskeletal: Degenerative changes along the lower lumbar spine particularly L4-5 and L5-S1 with disc bulging and encroachment. IMPRESSION: Wall thickening and inflammatory changes along the sigmoid colon consistent with diverticulitis. There is what appears to be an extraluminal abscess extending caudal to the sigmoid colon measuring 4.6 x 3.2 cm. This is immediately adjacent to the dome of the bladder with some bladder wall thickening and adjacent stranding. Additional smaller intramural abscess suggested along the course of the sigmoid colon just proximal to this. Please correlate clinical presentation and recommend follow up after treatment to confirm resolution and exclude secondary pathology. Stable mild ectasia of the pancreatic duct without focal pancreatic atrophy or obvious pancreatic mass. Electronically Signed   By: Ranell Bring M.D.   On: 03/16/2023 13:00    Labs:  CBC: Recent Labs    12/07/22 1351 03/16/23 0700 03/17/23 0434 03/19/23 0449  WBC 8.9 10.1 8.3 5.2  HGB 13.3 13.3 12.8* 13.2  HCT 42.3 42.7 40.8 41.9  PLT 322 230 216 222    COAGS: No results for input(s): INR, APTT in the last 8760 hours.  BMP: Recent Labs    12/05/22 0415 12/08/22 0259 03/16/23 0700 03/17/23 0434 03/19/23 0449  NA 136 141 137 138  --   K 4.1 3.6 3.6 3.3* 3.5  CL 106 105 104 103  --   CO2 25 26 28 28   --   GLUCOSE 113* 100* 90 87  --   BUN 6 5* 12 11  --   CALCIUM  8.6* 8.9 8.5* 8.5*  --   CREATININE 0.96 1.10 0.86 0.84 0.87  GFRNONAA >60 >60 >60 >60 >60    LIVER FUNCTION TESTS: Recent Labs    12/01/22 1316 12/02/22 0306  03/16/23 0700  BILITOT 1.2 1.0 0.8  AST 15 11* 13*  ALT 15 12 13   ALKPHOS 84 68 68  PROT 7.4 5.8* 6.6  ALBUMIN 4.0 2.9* 3.5    TUMOR MARKERS: No results for input(s): AFPTM, CEA, CA199, CHROMGRNA in the last 8760 hours.  Assessment and Plan:  Very pleasant 63 year old gentleman with a history of diverticular abscess treated by percutaneous drain placement as an inpatient in the hospital.  Today, his CT scan, drain injection and clinical assessment indicate a resolved abscess with no evidence of continued drain output or fistulous communication with the adjacent sigmoid colon.  Therefore, his drainage catheter was removed.  No further scheduled follow-up with interventional radiology.  Thank you for this interesting consult.  I greatly enjoyed meeting Marcus Andrews and look forward to participating in their care.  A copy of this report was sent to the requesting provider on this date.  Electronically Signed: Wilkie MARLA Lent 03/30/2023, 1:09 PM   I spent a total of  15 Minutes   in face to face in clinical consultation, greater than 50% of which was counseling/coordinating care for abscess drain management.

## 2023-03-31 ENCOUNTER — Encounter: Payer: Self-pay | Admitting: Family

## 2023-03-31 ENCOUNTER — Telehealth: Payer: Self-pay | Admitting: Family

## 2023-03-31 ENCOUNTER — Ambulatory Visit: Payer: 59 | Admitting: Family

## 2023-03-31 VITALS — BP 124/86 | HR 80 | Temp 98.4°F | Ht 72.0 in | Wt 176.4 lb

## 2023-03-31 DIAGNOSIS — K5720 Diverticulitis of large intestine with perforation and abscess without bleeding: Secondary | ICD-10-CM | POA: Diagnosis not present

## 2023-03-31 DIAGNOSIS — R0683 Snoring: Secondary | ICD-10-CM

## 2023-03-31 DIAGNOSIS — Z8249 Family history of ischemic heart disease and other diseases of the circulatory system: Secondary | ICD-10-CM | POA: Diagnosis not present

## 2023-03-31 DIAGNOSIS — Z87891 Personal history of nicotine dependence: Secondary | ICD-10-CM

## 2023-03-31 DIAGNOSIS — Z136 Encounter for screening for cardiovascular disorders: Secondary | ICD-10-CM

## 2023-03-31 DIAGNOSIS — E785 Hyperlipidemia, unspecified: Secondary | ICD-10-CM

## 2023-03-31 DIAGNOSIS — R634 Abnormal weight loss: Secondary | ICD-10-CM | POA: Diagnosis not present

## 2023-03-31 DIAGNOSIS — R7303 Prediabetes: Secondary | ICD-10-CM

## 2023-03-31 DIAGNOSIS — I700 Atherosclerosis of aorta: Secondary | ICD-10-CM | POA: Diagnosis not present

## 2023-03-31 DIAGNOSIS — Z122 Encounter for screening for malignant neoplasm of respiratory organs: Secondary | ICD-10-CM

## 2023-03-31 NOTE — Assessment & Plan Note (Signed)
 Suspected to be associated with diverticulitis  However discussed diet with pt for weight gain, caution with too high fiber foods to aggravate constipation.  Tsh reviewed from four months ago, stable Referral placed for nutrition.

## 2023-03-31 NOTE — Progress Notes (Signed)
 Established Patient Office Visit  Subjective:   Patient ID: Marcus Andrews, male    DOB: [birth year]  Age: 63 y.o. MRN: [id number]  CC:  Chief Complaint  Patient presents with   Hospitalization Follow-up    HPI: Marcus Andrews is a 63 y.o. male presenting on 03/31/2023 for Hospitalization Follow-up  Went to ER 12/01/22 for lower abdominal pain, ruq abd pain and abd cramping. CT abd pelvis with acute perforated sigmoid diverticulitis. U/s abd no acute cholecystitis. Transferred to [facility], admitted 9/18 and discharged 9/25.   Treated with conservative therapy with IV antbx and bowel rest. Repeat CT abd pelvis with stability. Advised to f/u with GI in outpt setting for colonoscopy in 6 weeks. Discharged with 7 days antbx (augmentin )  Evaluated outpatient patient with surgeon 11/25 and discussed plan for surgery as chronic diverticulitis state and recommended partial colectomy.  GI is Dr. Sandrea Cruel.  Surgeon Dr. Ulanda Gambles back to hospital 12/31 for CT guided placement of drain in pelvic abscess which was successful. Yellow purulent fluid was aspirated. Discharged from hospital and had planned f/u with radiology drain clinic for drain study to evaluate the abscess. CT abd pelvis 12/31 wall  thickening and inflammatory changes along sigmoid with diverticulitis and extraluminal abscess. Bladder wall thickening noted. Additiional smaller intramural abscess along the course of the sigmoid colon as well.   Went for f/u drain study and without abscess any more outpatient. Electiv colectomy moved to 05/12/23.    1/15 following with vascular IR, having improvement. CT scan indicated a resolved abscess without evidence of continued drain output or fistula, drainage catheter removed.   Wt Readings from Last 3 Encounters:  03/31/23 176 lb 6.4 oz (80 kg)  03/16/23 174 lb (78.9 kg)  01/12/23 170 lb (77.1 kg)   He is concerned with his weight loss and would like to try to gain some more  weight. He has no issues eating, appetite he states is good. He is taking daily metamucil and also dulcolax. He has boost at home but has not been taking this regularly.   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  He is asking for clearance to go back to work.   Pt requesting new referrals to cardiology, for a sleep study and also for lung cancer screening. These were placed mid last year and closed because pt was unable to schedule due to ongoing life concerns.   ROS: Negative unless specifically indicated above in HPI.   Relevant past medical history reviewed and updated as indicated.   Allergies and medications reviewed and updated.   Current Outpatient Medications:    acetaminophen  (TYLENOL ) 325 MG tablet, Take 2 tablets (650 mg total) by mouth every 6 (six) hours as needed for mild pain, fever or headache., Disp: , Rfl:    albuterol  (VENTOLIN  HFA) 108 (90 Base) MCG/ACT inhaler, TAKE 2 PUFFS BY MOUTH EVERY 6 HOURS AS NEEDED FOR WHEEZE OR SHORTNESS OF BREATH (Patient taking differently: Inhale 2 puffs into the lungs every 6 (six) hours as needed for shortness of breath or wheezing.), Disp: 18 each, Rfl: 0   atorvastatin  (LIPITOR) 10 MG tablet, Take 1 tablet (10 mg total) by mouth daily. (Patient not taking: Reported on 03/16/2023), Disp: 90 tablet, Rfl: 3   Ensure (ENSURE), Take 237 mLs by mouth daily as needed (for supplementation- or, Boost). Or boost, Disp: , Rfl:    glycopyrrolate  (ROBINUL ) 1 MG tablet,  Take 1 tablet (1 mg total) by mouth 2 (two) times daily., Disp: 60 tablet, Rfl: 11   lactose free nutrition (BOOST) LIQD, Take 237 mLs by mouth daily as needed (for supplementation- or, Ensure)., Disp: , Rfl:    Magnesium  250 MG TABS, Take 250 mg by mouth in the morning., Disp: , Rfl:    omeprazole  (PRILOSEC) 40 MG capsule, TAKE 1 CAPSULE (40 MG TOTAL) BY MOUTH DAILY. (Patient taking differently: Take 40 mg by mouth daily before breakfast.),  Disp: 30 capsule, Rfl: 8   sodium chloride  flush 0.9 % SOLN injection, Use to flush abdominal drain with 5 mL sterile saline once daily, Disp: 300 mL, Rfl: 3   traMADol  (ULTRAM ) 50 MG tablet, Take 1-2 tablets (50-100 mg total) by mouth every 8 (eight) hours as needed for severe pain (pain score 7-10) or moderate pain (pain score 4-6) (for pain)., Disp: 20 tablet, Rfl: 0  No Known Allergies  Objective:   BP 124/86 (BP Location: Right Arm, Patient Position: Sitting)   Pulse 80   Temp 98.4 F (36.9 C) (Temporal)   Ht 6' (1.829 m)   Wt 176 lb 6.4 oz (80 kg)   SpO2 98%   BMI 23.92 kg/m    Physical Exam Constitutional:      General: He is not in acute distress.    Appearance: Normal appearance. He is normal weight. He is not ill-appearing, toxic-appearing or diaphoretic.  Cardiovascular:     Rate and Rhythm: Normal rate and regular rhythm.  Pulmonary:     Effort: Pulmonary effort is normal.     Breath sounds: Normal breath sounds.  Musculoskeletal:        General: Normal range of motion.  Neurological:     General: No focal deficit present.     Mental Status: He is alert and oriented to person, place, and time. Mental status is at baseline.  Psychiatric:        Mood and Affect: Mood normal.        Behavior: Behavior normal.        Thought Content: Thought content normal.        Judgment: Judgment normal.     Assessment & Plan:  Aortic atherosclerosis (HCC) Assessment & Plan: Noted on CT scan  Pt advised to work on low cholesterol diet   Orders: -     Ambulatory referral to Cardiology  Weight loss Assessment & Plan: Suspected to be associated with diverticulitis  However discussed diet with pt for weight gain, caution with too high fiber foods to aggravate constipation.  Tsh reviewed from four months ago, stable Referral placed for nutrition.   Orders: -     Amb ref to Medical Nutrition Therapy-MNT  Diverticulitis of colon with perforation Assessment &  Plan: Abscess at this time resolved.  Pt following with Dr. Hershell Lose surgeon.  Pending colectomy.   Orders: -     Amb ref to Medical Nutrition Therapy-MNT  Prediabetes -     Amb ref to Medical Nutrition Therapy-MNT  Dyslipidemia Assessment & Plan: Advised pt to restart atorvastatin  10 mg nightly   Orders: -     Amb ref to Medical Nutrition Therapy-MNT -     Ambulatory referral to Cardiology  Family history of heart attack -     Ambulatory referral to Cardiology  Snoring -     Ambulatory referral to Sleep Studies  Screening for cardiovascular condition -     Ambulatory referral to Cardiology  Screening for lung cancer -  Ambulatory Referral for Lung Cancer Scre  History of tobacco abuse -     Ambulatory Referral for Lung Cancer Scre     Follow up plan: Return in about 3 months (around 06/29/2023).  Felicita Horns, FNP

## 2023-03-31 NOTE — Assessment & Plan Note (Signed)
 Noted on CT scan  Pt advised to work on low cholesterol diet

## 2023-03-31 NOTE — Assessment & Plan Note (Signed)
 Abscess at this time resolved.  Pt following with Dr. Hershell Lose surgeon.  Pending colectomy.

## 2023-03-31 NOTE — Patient Instructions (Addendum)
  A referral was placed today for a nutritionist to help with weight.  Please let us  know if you have not heard back within 2 weeks about the referral.

## 2023-03-31 NOTE — Assessment & Plan Note (Signed)
 Advised pt to restart atorvastatin  10 mg nightly

## 2023-04-01 ENCOUNTER — Encounter: Payer: Self-pay | Admitting: *Deleted

## 2023-04-02 ENCOUNTER — Other Ambulatory Visit: Payer: Self-pay

## 2023-04-02 ENCOUNTER — Telehealth: Payer: Self-pay

## 2023-04-02 DIAGNOSIS — Z122 Encounter for screening for malignant neoplasm of respiratory organs: Secondary | ICD-10-CM

## 2023-04-02 DIAGNOSIS — Z87891 Personal history of nicotine dependence: Secondary | ICD-10-CM

## 2023-04-02 NOTE — Telephone Encounter (Signed)
.  Lung Cancer Screening Narrative/Criteria Questionnaire (Cigarette Smokers Only- No Cigars/Pipes/vapes)   Marcus Andrews   SDMV:04/12/2023 @ 9:30am with Keturah Barre, RN   1960-09-28   LDCT: 04/14/2023 @ 10:00am @ [facility]    63 y.o.   Phone: [phone]  Lung Screening Narrative (confirm age 46-77 yrs Medicare / 50-80 yrs Private pay insurance)   Insurance information:[employer]   Referring Provider:[facility]   This screening involves an initial phone call with a team member from our program. It is called a shared decision making visit. The initial meeting is required by  insurance and Medicare to make sure you understand the program. This appointment takes about 15-20 minutes to complete. You will complete the screening scan at your scheduled date/time.  This scan takes about 5-10 minutes to complete. You can eat and drink normally before and after the scan.  Criteria questions for Lung Cancer Screening:   Are you a current or former smoker? Former Age began smoking: 18   If you are a former smoker, what year did you quit smoking? 05/27/2018   To calculate your smoking history, I need an accurate estimate of how many packs of cigarettes you smoked per day and for how many years. (Not just the number of PPD you are now smoking)   Years smoking 40 x Packs per day 1.5 = Pack years 61   (at least 20 pack yrs)   (Make sure they understand that we need to know how much they have smoked in the past, not just the number of PPD they are smoking now)  Do you have a personal history of cancer?  No    Do you have a family history of cancer? Yes  (cancer type and and relative) Mother colon sister breast  grandmother unknown type aunt unknown type  Are you coughing up blood?  No  Have you had unexplained weight loss of 15 lbs or more in the last 6 months? No  It looks like you meet all criteria.  When would be a good time for [place] to schedule you for this screening?   Additional  information:

## 2023-04-06 NOTE — Telephone Encounter (Signed)
Letter has been printed and placed up front for pick up. Pt is aware.

## 2023-04-06 NOTE — Telephone Encounter (Signed)
LM for pt to returncall

## 2023-04-06 NOTE — Telephone Encounter (Signed)
Can you call pt with this info. It's a bit overdue but he didn't read it in his mychart.   Marcus Andrews,    Dr. [name] got back to me pretty quickly. He said as long as you are feeling better since drainage (which you appeared to be) you could go back to work until the colectomy.    Please keep an eye out for symptoms or worsening pain. When do you want to go back to work? Monday? I will write it for you once I get your response.      Regards,    Mort Sawyers FNP-C

## 2023-04-06 NOTE — Telephone Encounter (Signed)
Work note sent to [employer].  Of course If he prefers to pick up can print out as well.

## 2023-04-06 NOTE — Telephone Encounter (Signed)
Spoke with pt and he is aware Tabitha's repsonse. Pt would like the letter to state that he can go back to work today, 04/06/2023. He would like to pick this letter once it's ready.

## 2023-04-12 ENCOUNTER — Ambulatory Visit (INDEPENDENT_AMBULATORY_CARE_PROVIDER_SITE_OTHER): Payer: 59 | Admitting: Acute Care

## 2023-04-12 DIAGNOSIS — Z87891 Personal history of nicotine dependence: Secondary | ICD-10-CM | POA: Diagnosis not present

## 2023-04-12 NOTE — Patient Instructions (Signed)

## 2023-04-12 NOTE — Progress Notes (Signed)
 Provider Attestation I agree with the documentation of the Shared Decision Making visit,  smoking cessation counseling if appropriate, and verification or eligibility for lung cancer screening as documented by the RN Nurse Navigator.   Marcus Bullock, MSN, AGACNP-BC [facility] See Amion for personal pager PCCM on call pager [phone]     Virtual Visit via Telephone Note  I connected with Marcus Andrews on 04/12/23 at  9:30 AM EST by telephone and verified that I am speaking with the correct person using two identifiers.  Location: Patient: in home Provider: [address], [place], [place], [address]    Shared Decision Making Visit Lung Cancer Screening Program [phone])   Eligibility: Age 63 y.o. Pack Years Smoking History Calculation 60 (# packs/per year x # years smoked) Recent History of coughing up blood  no Unexplained weight loss? no ( >Than 15 pounds within the last 6 months ) Prior History Lung / other cancer no (Diagnosis within the last 5 years already requiring surveillance chest CT Scans). Smoking Status Former Smoker Former Smokers: Years since quit: 4 years  Quit Date: 05-27-18  Visit Components: Discussion included one or more decision making aids. yes Discussion included risk/benefits of screening. yes Discussion included potential follow up diagnostic testing for abnormal scans. yes Discussion included meaning and risk of over diagnosis. yes Discussion included meaning and risk of False Positives. yes Discussion included meaning of total radiation exposure. yes  Counseling Included: Importance of adherence to annual lung cancer LDCT screening. yes Impact of comorbidities on ability to participate in the program. yes Ability and willingness to under diagnostic treatment. yes  Smoking Cessation Counseling: Current Smokers:  Discussed importance of smoking cessation. yes Information about tobacco cessation classes  and interventions provided to patient. yes Patient provided with "ticket" for LDCT Scan.  NA Symptomatic Patient. no  Counseling NA Diagnosis Code: Tobacco Use Z72.0 Asymptomatic Patient yes  Counseling (Intermediate counseling: > three minutes counseling) Y6503 Former Smokers:  Discussed the importance of maintaining cigarette abstinence. yes Diagnosis Code: Personal History of Nicotine Dependence. T46.568 Information about tobacco cessation classes and interventions provided to patient. Yes Patient provided with "ticket" for LDCT Scan. yes Written Order for Lung Cancer Screening with LDCT placed in Epic. Yes (CT Chest Lung Cancer Screening Low Dose W/O CM) [id number] Z12.2-Screening of respiratory organs Z87.891-Personal history of nicotine dependence   Marcus Gaskins, RN 04/12/23

## 2023-04-14 ENCOUNTER — Ambulatory Visit
Admission: RE | Admit: 2023-04-14 | Discharge: 2023-04-14 | Disposition: A | Discharge status: Home / Self Care | Payer: 59 | Source: Ambulatory Visit | Attending: Acute Care | Admitting: Acute Care

## 2023-04-14 DIAGNOSIS — Z87891 Personal history of nicotine dependence: Secondary | ICD-10-CM | POA: Diagnosis not present

## 2023-04-14 DIAGNOSIS — Z122 Encounter for screening for malignant neoplasm of respiratory organs: Secondary | ICD-10-CM

## 2023-04-21 ENCOUNTER — Ambulatory Visit: Payer: Self-pay | Admitting: Surgery

## 2023-04-21 DIAGNOSIS — Z531 Procedure and treatment not carried out because of patient's decision for reasons of belief and group pressure: Secondary | ICD-10-CM | POA: Diagnosis not present

## 2023-04-21 DIAGNOSIS — K5720 Diverticulitis of large intestine with perforation and abscess without bleeding: Secondary | ICD-10-CM | POA: Diagnosis not present

## 2023-04-21 DIAGNOSIS — R739 Hyperglycemia, unspecified: Secondary | ICD-10-CM

## 2023-04-21 DIAGNOSIS — Z8719 Personal history of other diseases of the digestive system: Secondary | ICD-10-CM | POA: Diagnosis not present

## 2023-04-26 ENCOUNTER — Other Ambulatory Visit: Payer: Self-pay

## 2023-04-26 DIAGNOSIS — Z87891 Personal history of nicotine dependence: Secondary | ICD-10-CM

## 2023-04-26 DIAGNOSIS — Z122 Encounter for screening for malignant neoplasm of respiratory organs: Secondary | ICD-10-CM

## 2023-04-27 NOTE — Patient Instructions (Signed)
SURGICAL WAITING ROOM VISITATION Patients having surgery or a procedure may have no more than 2 support people in the waiting area - these visitors may rotate.    Children under the age of 4 must have an adult with them who is not the patient.  Due to an increase in RSV and influenza rates and associated hospitalizations, children ages 78 and under may not visit patients in [facility] hospitals.   If the patient needs to stay at the hospital during part of their recovery, the visitor guidelines for inpatient rooms apply. Pre-op nurse will coordinate an appropriate time for 1 support person to accompany patient in pre-op.  This support person may not rotate.    Please refer to the [facility] website for the visitor guidelines for Inpatients (after your surgery is over and you are in a regular room).       Your procedure is scheduled on: 05-12-23   Report to [facility] Main Entrance    Report to admitting at 10:15 AM   Call this number if you have problems the morning of surgery [phone]   Do not eat food :After Midnight.   After Midnight you may have the following liquids until 9:30 AM DAY OF SURGERY  Water Non-Citrus Juices (without pulp, NO RED-Apple, White grape, White cranberry) Black Coffee (NO MILK/CREAM OR CREAMERS, sugar ok)  Clear Tea (NO MILK/CREAM OR CREAMERS, sugar ok) regular and decaf                             Plain Jell-O (NO RED)                                           Fruit ices (not with fruit pulp, NO RED)                                     Popsicles (NO RED)                                                               Sports drinks like Gatorade (NO RED)  Drink 2 Pre-surgery Ensure the evening before surgery                    The day of surgery:  Drink ONE (1) Pre-Surgery Clear Ensure or G2 by 9:30 AM the morning of surgery. Drink in one sitting. Do not sip.  This drink was given to you during your hospital  pre-op appointment  visit. Nothing else to drink after completing the Pre-Surgery Clear Ensure or G2.          If you have questions, please contact your surgeon's office.   FOLLOW BOWEL PREP AND ANY ADDITIONAL PRE OP INSTRUCTIONS YOU RECEIVED FROM YOUR SURGEON'S OFFICE!!!     Oral Hygiene is also important to reduce your risk of infection.  Remember - BRUSH YOUR TEETH THE MORNING OF SURGERY WITH YOUR REGULAR TOOTHPASTE   Do NOT smoke after Midnight   Take these medicines the morning of surgery with A SIP OF WATER:    Glycopyrrolate   Omeprazole   Okay to use inhalers   If needed Tylenol, Tramadol  Stop all vitamins and herbal supplements 7 days before surgery  Bring CPAP mask and tubing day of surgery.                              You may not have any metal on your body including  jewelry, and body piercing             Do not wear  lotions, powders, cologne, or deodorant              Men may shave face and neck.   Do not bring valuables to the hospital. [facility] IS NOT RESPONSIBLE   FOR VALUABLES.   Contacts, dentures or bridgework may not be worn into surgery.   Bring small overnight bag day of surgery.   DO NOT BRING YOUR HOME MEDICATIONS TO THE HOSPITAL. PHARMACY WILL DISPENSE MEDICATIONS LISTED ON YOUR MEDICATION LIST TO YOU DURING YOUR ADMISSION IN THE HOSPITAL!    Special Instructions: Bring a copy of your healthcare power of attorney and living will documents the day of surgery if you haven't scanned them before.              Please read over the following fact sheets you were given: IF YOU HAVE QUESTIONS ABOUT YOUR PRE-OP INSTRUCTIONS PLEASE CALL [phone] [facility]  If you received a COVID test during your pre-op visit  it is requested that you wear a mask when out in public, stay away from anyone that may not be feeling well and notify your surgeon if you develop symptoms. If you test positive for Covid or have been in contact with anyone that has  tested positive in the last 10 days please notify you surgeon.  [facility] - Preparing for Surgery Before surgery, you can play an important role.  Because skin is not sterile, your skin needs to be as free of germs as possible.  You can reduce the number of germs on your skin by washing with CHG (chlorahexidine gluconate) soap before surgery.  CHG is an antiseptic cleaner which kills germs and bonds with the skin to continue killing germs even after washing. Please DO NOT use if you have an allergy to CHG or antibacterial soaps.  If your skin becomes reddened/irritated stop using the CHG and inform your nurse when you arrive at Short Stay. Do not shave (including legs and underarms) for at least 48 hours prior to the first CHG shower.  You may shave your face/neck.  Please follow these instructions carefully:  1.  Shower with CHG Soap the night before surgery and the  morning of surgery.  2.  If you choose to wash your hair, wash your hair first as usual with your normal  shampoo.  3.  After you shampoo, rinse your hair and body thoroughly to remove the shampoo.                             4.  Use CHG as you would any other liquid soap.  You can apply chg directly to the skin and wash.  Gently with  a scrungie or clean washcloth.  5.  Apply the CHG Soap to your body ONLY FROM THE NECK DOWN.   Do   not use on face/ open                           Wound or open sores. Avoid contact with eyes, ears mouth and   genitals (private parts).                       Wash face,  Genitals (private parts) with your normal soap.             6.  Wash thoroughly, paying special attention to the area where your    surgery  will be performed.  7.  Thoroughly rinse your body with warm water from the neck down.  8.  DO NOT shower/wash with your normal soap after using and rinsing off the CHG Soap.                9.  Pat yourself dry with a clean towel.            10.  Wear clean pajamas.            11.  Place clean  sheets on your bed the night of your first shower and do not  sleep with pets. Day of Surgery : Do not apply any lotions/deodorants the morning of surgery.  Please wear clean clothes to the hospital/surgery center.  FAILURE TO FOLLOW THESE INSTRUCTIONS MAY RESULT IN THE CANCELLATION OF YOUR SURGERY  PATIENT SIGNATURE_________________________________  NURSE SIGNATURE__________________________________  ________________________________________________________________________   WHAT IS A BLOOD TRANSFUSION? Blood Transfusion Information  A transfusion is the replacement of blood or some of its parts. Blood is made up of multiple cells which provide different functions. Red blood cells carry oxygen and are used for blood loss replacement. White blood cells fight against infection. Platelets control bleeding. Plasma helps clot blood. Other blood products are available for specialized needs, such as hemophilia or other clotting disorders. BEFORE THE TRANSFUSION  Who gives blood for transfusions?  Healthy volunteers who are fully evaluated to make sure their blood is safe. This is blood bank blood. Transfusion therapy is the safest it has ever been in the practice of medicine. Before blood is taken from a donor, a complete history is taken to make sure that person has no history of diseases nor engages in risky social behavior (examples are intravenous drug use or sexual activity with multiple partners). The donor's travel history is screened to minimize risk of transmitting infections, such as malaria. The donated blood is tested for signs of infectious diseases, such as HIV and hepatitis. The blood is then tested to be sure it is compatible with you in order to minimize the chance of a transfusion reaction. If you or a relative donates blood, this is often done in anticipation of surgery and is not appropriate for emergency situations. It takes many days to process the donated blood. RISKS AND  COMPLICATIONS Although transfusion therapy is very safe and saves many lives, the main dangers of transfusion include:  Getting an infectious disease. Developing a transfusion reaction. This is an allergic reaction to something in the blood you were given. Every precaution is taken to prevent this. The decision to have a blood transfusion has been considered carefully by your caregiver before blood is given. Blood is not given unless the benefits outweigh the  risks. AFTER THE TRANSFUSION Right after receiving a blood transfusion, you will usually feel much better and more energetic. This is especially true if your red blood cells have gotten low (anemic). The transfusion raises the level of the red blood cells which carry oxygen, and this usually causes an energy increase. The nurse administering the transfusion will monitor you carefully for complications. HOME CARE INSTRUCTIONS  No special instructions are needed after a transfusion. You may find your energy is better. Speak with your caregiver about any limitations on activity for underlying diseases you may have. SEEK MEDICAL CARE IF:  Your condition is not improving after your transfusion. You develop redness or irritation at the intravenous (IV) site. SEEK IMMEDIATE MEDICAL CARE IF:  Any of the following symptoms occur over the next 12 hours: Shaking chills. You have a temperature by mouth above 102 F (38.9 C), not controlled by medicine. Chest, back, or muscle pain. People around you feel you are not acting correctly or are confused. Shortness of breath or difficulty breathing. Dizziness and fainting. You get a rash or develop hives. You have a decrease in urine output. Your urine turns a dark color or changes to pink, red, or brown. Any of the following symptoms occur over the next 10 days: You have a temperature by mouth above 102 F (38.9 C), not controlled by medicine. Shortness of breath. Weakness after normal activity. The  white part of the eye turns yellow (jaundice). You have a decrease in the amount of urine or are urinating less often. Your urine turns a dark color or changes to pink, red, or brown. Document Released: 02/28/2000 Document Revised: 05/25/2011 Document Reviewed: 10/17/2007 [facility] Patient Information 2014 [place], [place].  _______________________________________________________________________

## 2023-04-28 NOTE — Progress Notes (Signed)
COVID Vaccine Completed:  Yes  Date of COVID positive in last 90 days:  PCP - Mort Sawyers, FNP Cardiologist -   Chest x-ray - CT chest 04-14-23 Epic EKG - 12-02-22 Epic Stress Test -  ECHO -  Cardiac Cath -  Pacemaker/ICD device last checked: Spinal Cord Stimulator:  Bowel Prep -   Sleep Study -  CPAP -   Fasting Blood Sugar -  Checks Blood Sugar _____ times a day  Last dose of GLP1 agonist-  N/A GLP1 instructions:  Hold 7 days before surgery    Last dose of SGLT-2 inhibitors-  N/A SGLT-2 instructions:  Hold 3 days before surgery    Blood Thinner Instructions:  Last dose:   Time: Aspirin Instructions: Last Dose:  Activity level:  Can go up a flight of stairs and perform activities of daily living without stopping and without symptoms of chest pain or shortness of breath.  Able to exercise without symptoms  Unable to go up a flight of stairs without symptoms of     Anesthesia review:  Abnormal R wave progression on EKG, aortic atherosclerosis  Patient denies shortness of breath, fever, cough and chest pain at PAT appointment  Patient verbalized understanding of instructions that were given to them at the PAT appointment. Patient was also instructed that they will need to review over the PAT instructions again at home before surgery.

## 2023-04-28 NOTE — Progress Notes (Signed)
Left message for Bjorn Loser at Dr. Mayer Masker' office concerning the indication on surgery orders in Epic.  States colostomy status, patient does not have a colostomy, is having surgery for diverticulitis.

## 2023-04-29 ENCOUNTER — Other Ambulatory Visit: Payer: Self-pay

## 2023-04-29 ENCOUNTER — Encounter (HOSPITAL_COMMUNITY): Payer: Self-pay

## 2023-04-29 ENCOUNTER — Encounter (HOSPITAL_COMMUNITY)
Admission: RE | Admit: 2023-04-29 | Discharge: 2023-04-29 | Disposition: A | Discharge status: Home / Self Care | Payer: 59 | Source: Ambulatory Visit | Attending: Surgery | Admitting: Surgery

## 2023-04-29 VITALS — BP 146/84 | HR 78 | Temp 98.9°F | Resp 16 | Ht 72.0 in | Wt 180.0 lb

## 2023-04-29 DIAGNOSIS — R739 Hyperglycemia, unspecified: Secondary | ICD-10-CM | POA: Diagnosis not present

## 2023-04-29 DIAGNOSIS — Z01818 Encounter for other preprocedural examination: Secondary | ICD-10-CM

## 2023-04-29 DIAGNOSIS — Z01812 Encounter for preprocedural laboratory examination: Secondary | ICD-10-CM | POA: Insufficient documentation

## 2023-04-29 HISTORY — DX: Prediabetes: R73.03

## 2023-04-29 LAB — CBC
HCT: 43.7 % (ref 39.0–52.0)
Hemoglobin: 13.6 g/dL (ref 13.0–17.0)
MCH: 27 pg (ref 26.0–34.0)
MCHC: 31.1 g/dL (ref 30.0–36.0)
MCV: 86.9 fL (ref 80.0–100.0)
Platelets: 236 10*3/uL (ref 150–400)
RBC: 5.03 MIL/uL (ref 4.22–5.81)
RDW: 15.3 % (ref 11.5–15.5)
WBC: 6.4 10*3/uL (ref 4.0–10.5)
nRBC: 0 % (ref 0.0–0.2)

## 2023-04-29 LAB — NO BLOOD PRODUCTS

## 2023-04-29 LAB — HEMOGLOBIN A1C
Hgb A1c MFr Bld: 5.7 % — ABNORMAL HIGH (ref 4.8–5.6)
Mean Plasma Glucose: 116.89 mg/dL

## 2023-04-30 ENCOUNTER — Other Ambulatory Visit: Payer: Self-pay | Admitting: Urology

## 2023-05-12 ENCOUNTER — Inpatient Hospital Stay (HOSPITAL_COMMUNITY): Payer: Self-pay | Admitting: Registered Nurse

## 2023-05-12 ENCOUNTER — Encounter (HOSPITAL_COMMUNITY): Payer: Self-pay | Admitting: Surgery

## 2023-05-12 ENCOUNTER — Other Ambulatory Visit: Payer: Self-pay

## 2023-05-12 ENCOUNTER — Inpatient Hospital Stay (HOSPITAL_COMMUNITY)
Admission: RE | Admit: 2023-05-12 | Discharge: 2023-05-15 | DRG: 329 | Disposition: A | Discharge status: Home / Self Care | Payer: 59 | Attending: Surgery | Admitting: Surgery

## 2023-05-12 ENCOUNTER — Inpatient Hospital Stay (HOSPITAL_COMMUNITY): Payer: 59 | Admitting: Physician Assistant

## 2023-05-12 ENCOUNTER — Encounter (HOSPITAL_COMMUNITY)
Admission: RE | Disposition: A | Discharge status: Home / Self Care | Payer: Self-pay | Source: Home / Self Care | Attending: Surgery

## 2023-05-12 DIAGNOSIS — K651 Peritoneal abscess: Secondary | ICD-10-CM | POA: Diagnosis present

## 2023-05-12 DIAGNOSIS — K219 Gastro-esophageal reflux disease without esophagitis: Secondary | ICD-10-CM | POA: Diagnosis present

## 2023-05-12 DIAGNOSIS — I700 Atherosclerosis of aorta: Secondary | ICD-10-CM | POA: Diagnosis present

## 2023-05-12 DIAGNOSIS — Z87891 Personal history of nicotine dependence: Secondary | ICD-10-CM | POA: Diagnosis not present

## 2023-05-12 DIAGNOSIS — Z825 Family history of asthma and other chronic lower respiratory diseases: Secondary | ICD-10-CM

## 2023-05-12 DIAGNOSIS — Z823 Family history of stroke: Secondary | ICD-10-CM

## 2023-05-12 DIAGNOSIS — Z9889 Other specified postprocedural states: Secondary | ICD-10-CM

## 2023-05-12 DIAGNOSIS — K5651 Intestinal adhesions [bands], with partial obstruction: Secondary | ICD-10-CM | POA: Diagnosis not present

## 2023-05-12 DIAGNOSIS — K5720 Diverticulitis of large intestine with perforation and abscess without bleeding: Secondary | ICD-10-CM | POA: Diagnosis not present

## 2023-05-12 DIAGNOSIS — Z833 Family history of diabetes mellitus: Secondary | ICD-10-CM | POA: Diagnosis not present

## 2023-05-12 DIAGNOSIS — Z83438 Family history of other disorder of lipoprotein metabolism and other lipidemia: Secondary | ICD-10-CM | POA: Diagnosis not present

## 2023-05-12 DIAGNOSIS — Z79899 Other long term (current) drug therapy: Secondary | ICD-10-CM

## 2023-05-12 DIAGNOSIS — K565 Intestinal adhesions [bands], unspecified as to partial versus complete obstruction: Secondary | ICD-10-CM

## 2023-05-12 DIAGNOSIS — M1710 Unilateral primary osteoarthritis, unspecified knee: Secondary | ICD-10-CM | POA: Diagnosis present

## 2023-05-12 DIAGNOSIS — K56699 Other intestinal obstruction unspecified as to partial versus complete obstruction: Principal | ICD-10-CM | POA: Diagnosis present

## 2023-05-12 DIAGNOSIS — K5792 Diverticulitis of intestine, part unspecified, without perforation or abscess without bleeding: Secondary | ICD-10-CM | POA: Diagnosis not present

## 2023-05-12 DIAGNOSIS — Z800 Family history of malignant neoplasm of digestive organs: Secondary | ICD-10-CM | POA: Diagnosis not present

## 2023-05-12 DIAGNOSIS — R7303 Prediabetes: Secondary | ICD-10-CM | POA: Diagnosis present

## 2023-05-12 DIAGNOSIS — Z8249 Family history of ischemic heart disease and other diseases of the circulatory system: Secondary | ICD-10-CM

## 2023-05-12 DIAGNOSIS — E785 Hyperlipidemia, unspecified: Secondary | ICD-10-CM | POA: Diagnosis not present

## 2023-05-12 DIAGNOSIS — Z83719 Family history of colon polyps, unspecified: Secondary | ICD-10-CM

## 2023-05-12 DIAGNOSIS — Z531 Procedure and treatment not carried out because of patient's decision for reasons of belief and group pressure: Secondary | ICD-10-CM | POA: Diagnosis present

## 2023-05-12 DIAGNOSIS — Z807 Family history of other malignant neoplasms of lymphoid, hematopoietic and related tissues: Secondary | ICD-10-CM

## 2023-05-12 DIAGNOSIS — Z803 Family history of malignant neoplasm of breast: Secondary | ICD-10-CM

## 2023-05-12 DIAGNOSIS — K5732 Diverticulitis of large intestine without perforation or abscess without bleeding: Secondary | ICD-10-CM | POA: Diagnosis not present

## 2023-05-12 DIAGNOSIS — Z860100 Personal history of colon polyps, unspecified: Secondary | ICD-10-CM

## 2023-05-12 DIAGNOSIS — Z409 Encounter for prophylactic surgery, unspecified: Secondary | ICD-10-CM | POA: Diagnosis not present

## 2023-05-12 HISTORY — PX: FLEXIBLE SIGMOIDOSCOPY: SHX5431

## 2023-05-12 SURGERY — COLECTOMY, SIGMOID, ROBOT-ASSISTED
Anesthesia: General

## 2023-05-12 MED ORDER — STERILE WATER FOR INJECTION IJ SOLN
INTRAMUSCULAR | Status: AC
  Filled 2023-05-12: qty 10

## 2023-05-12 MED ORDER — LABETALOL HCL 5 MG/ML IV SOLN
INTRAVENOUS | Status: AC
  Filled 2023-05-12: qty 4

## 2023-05-12 MED ORDER — CALCIUM POLYCARBOPHIL 625 MG PO TABS
625.0000 mg | ORAL_TABLET | Freq: Two times a day (BID) | ORAL | Status: DC
  Administered 2023-05-13 – 2023-05-15 (×6): 625 mg via ORAL
  Filled 2023-05-12 (×6): qty 1

## 2023-05-12 MED ORDER — LIDOCAINE HCL (PF) 2 % IJ SOLN
INTRAMUSCULAR | Status: AC
  Filled 2023-05-12: qty 5

## 2023-05-12 MED ORDER — PHENYLEPHRINE HCL (PRESSORS) 10 MG/ML IV SOLN
INTRAVENOUS | Status: DC | PRN
  Administered 2023-05-12: 80 ug via INTRAVENOUS

## 2023-05-12 MED ORDER — ENOXAPARIN SODIUM 40 MG/0.4ML IJ SOSY
40.0000 mg | PREFILLED_SYRINGE | INTRAMUSCULAR | Status: DC
  Administered 2023-05-13 – 2023-05-14 (×2): 40 mg via SUBCUTANEOUS
  Filled 2023-05-12 (×3): qty 0.4

## 2023-05-12 MED ORDER — ENOXAPARIN SODIUM 40 MG/0.4ML IJ SOSY
40.0000 mg | PREFILLED_SYRINGE | Freq: Once | INTRAMUSCULAR | Status: AC
  Administered 2023-05-12: 40 mg via SUBCUTANEOUS
  Filled 2023-05-12: qty 0.4

## 2023-05-12 MED ORDER — BUPIVACAINE-EPINEPHRINE (PF) 0.25% -1:200000 IJ SOLN
INTRAMUSCULAR | Status: DC | PRN
  Administered 2023-05-12: 50 mL

## 2023-05-12 MED ORDER — ONDANSETRON HCL 4 MG/2ML IJ SOLN
INTRAMUSCULAR | Status: AC
  Filled 2023-05-12: qty 2

## 2023-05-12 MED ORDER — SODIUM CHLORIDE 0.9 % IV SOLN: 250.0000 mL | INTRAVENOUS | Status: DC | PRN

## 2023-05-12 MED ORDER — KETAMINE HCL 50 MG/5ML IJ SOSY
PREFILLED_SYRINGE | INTRAMUSCULAR | Status: DC | PRN
  Administered 2023-05-12 (×2): 10 mg via INTRAVENOUS

## 2023-05-12 MED ORDER — GABAPENTIN 100 MG PO CAPS
200.0000 mg | ORAL_CAPSULE | Freq: Every day | ORAL | Status: DC
  Administered 2023-05-12 – 2023-05-13 (×2): 200 mg via ORAL
  Filled 2023-05-12 (×3): qty 2

## 2023-05-12 MED ORDER — BUPIVACAINE LIPOSOME 1.3 % IJ SUSP
INTRAMUSCULAR | Status: DC | PRN
  Administered 2023-05-12: 20 mL

## 2023-05-12 MED ORDER — ACETAMINOPHEN 500 MG PO TABS
1000.0000 mg | ORAL_TABLET | Freq: Four times a day (QID) | ORAL | Status: DC
  Administered 2023-05-13 – 2023-05-14 (×6): 1000 mg via ORAL
  Filled 2023-05-12 (×6): qty 2

## 2023-05-12 MED ORDER — BUPIVACAINE LIPOSOME 1.3 % IJ SUSP
20.0000 mL | Freq: Once | INTRAMUSCULAR | Status: DC
  Administered 2023-05-12: 266 mg

## 2023-05-12 MED ORDER — CELECOXIB 200 MG PO CAPS
200.0000 mg | ORAL_CAPSULE | ORAL | Status: AC
  Administered 2023-05-12: 200 mg via ORAL
  Filled 2023-05-12: qty 1

## 2023-05-12 MED ORDER — ONDANSETRON HCL 4 MG/2ML IJ SOLN: 4.0000 mg | Freq: Four times a day (QID) | INTRAMUSCULAR | Status: DC | PRN

## 2023-05-12 MED ORDER — MAGIC MOUTHWASH: 15.0000 mL | Freq: Four times a day (QID) | ORAL | Status: DC | PRN

## 2023-05-12 MED ORDER — GLYCOPYRROLATE 1 MG PO TABS
1.0000 mg | ORAL_TABLET | Freq: Two times a day (BID) | ORAL | Status: DC
  Administered 2023-05-13 – 2023-05-15 (×6): 1 mg via ORAL
  Filled 2023-05-12 (×6): qty 1

## 2023-05-12 MED ORDER — LABETALOL HCL 5 MG/ML IV SOLN
INTRAVENOUS | Status: DC | PRN
  Administered 2023-05-12: 5 mg via INTRAVENOUS

## 2023-05-12 MED ORDER — LACTATED RINGERS IV SOLN: INTRAVENOUS | Status: DC | PRN

## 2023-05-12 MED ORDER — DEXAMETHASONE SODIUM PHOSPHATE 10 MG/ML IJ SOLN
INTRAMUSCULAR | Status: AC
  Filled 2023-05-12: qty 1

## 2023-05-12 MED ORDER — SCOPOLAMINE 1 MG/3DAYS TD PT72
1.0000 | MEDICATED_PATCH | TRANSDERMAL | Status: DC
  Administered 2023-05-12: 1.5 mg via TRANSDERMAL
  Filled 2023-05-12: qty 1

## 2023-05-12 MED ORDER — PHENOL 1.4 % MT LIQD: 2.0000 | OROMUCOSAL | Status: DC | PRN

## 2023-05-12 MED ORDER — MELATONIN 3 MG PO TABS
3.0000 mg | ORAL_TABLET | Freq: Every evening | ORAL | Status: DC | PRN
  Administered 2023-05-14: 3 mg via ORAL
  Filled 2023-05-12: qty 1

## 2023-05-12 MED ORDER — SODIUM CHLORIDE 0.9 % IV SOLN
2.0000 g | INTRAVENOUS | Status: AC
  Administered 2023-05-12: 2 g via INTRAVENOUS
  Filled 2023-05-12: qty 2

## 2023-05-12 MED ORDER — INDOCYANINE GREEN 25 MG IV SOLR
INTRAVENOUS | Status: DC | PRN
  Administered 2023-05-12: 7.5 mg via INTRAVENOUS

## 2023-05-12 MED ORDER — ALBUTEROL SULFATE (2.5 MG/3ML) 0.083% IN NEBU: 2.5000 mg | INHALATION_SOLUTION | Freq: Four times a day (QID) | RESPIRATORY_TRACT | Status: DC | PRN

## 2023-05-12 MED ORDER — LIDOCAINE HCL (PF) 2 % IJ SOLN
INTRAMUSCULAR | Status: DC | PRN
  Administered 2023-05-12: 1.5 mg/kg/h via INTRADERMAL
  Administered 2023-05-12: 60 mg via INTRADERMAL

## 2023-05-12 MED ORDER — NEOMYCIN SULFATE 500 MG PO TABS
1000.0000 mg | ORAL_TABLET | ORAL | Status: DC
Start: 2023-05-12 — End: 2023-05-12

## 2023-05-12 MED ORDER — ROCURONIUM BROMIDE 10 MG/ML (PF) SYRINGE
PREFILLED_SYRINGE | INTRAVENOUS | Status: DC | PRN
  Administered 2023-05-12: 10 mg via INTRAVENOUS
  Administered 2023-05-12: 70 mg via INTRAVENOUS
  Administered 2023-05-12: 20 mg via INTRAVENOUS

## 2023-05-12 MED ORDER — ONDANSETRON HCL 4 MG/2ML IJ SOLN
INTRAMUSCULAR | Status: DC | PRN
  Administered 2023-05-12: 4 mg via INTRAVENOUS

## 2023-05-12 MED ORDER — METRONIDAZOLE 500 MG PO TABS: 1000.0000 mg | ORAL_TABLET | ORAL | Status: DC

## 2023-05-12 MED ORDER — BUPIVACAINE LIPOSOME 1.3 % IJ SUSP
INTRAMUSCULAR | Status: AC
  Filled 2023-05-12: qty 20

## 2023-05-12 MED ORDER — SODIUM CHLORIDE 0.9% FLUSH: 3.0000 mL | INTRAVENOUS | Status: DC | PRN

## 2023-05-12 MED ORDER — DIPHENHYDRAMINE HCL 50 MG/ML IJ SOLN: 12.5000 mg | Freq: Four times a day (QID) | INTRAMUSCULAR | Status: DC | PRN

## 2023-05-12 MED ORDER — FENTANYL CITRATE (PF) 100 MCG/2ML IJ SOLN
INTRAMUSCULAR | Status: AC
  Filled 2023-05-12: qty 2

## 2023-05-12 MED ORDER — FENTANYL CITRATE (PF) 100 MCG/2ML IJ SOLN
INTRAMUSCULAR | Status: DC | PRN
Start: 2023-05-12 — End: 2023-05-12
  Administered 2023-05-12: 100 ug via INTRAVENOUS
  Administered 2023-05-12 (×4): 50 ug via INTRAVENOUS

## 2023-05-12 MED ORDER — AMISULPRIDE (ANTIEMETIC) 5 MG/2ML IV SOLN: 10.0000 mg | Freq: Once | INTRAVENOUS | Status: DC | PRN

## 2023-05-12 MED ORDER — ROCURONIUM BROMIDE 10 MG/ML (PF) SYRINGE
PREFILLED_SYRINGE | INTRAVENOUS | Status: AC
  Filled 2023-05-12: qty 10

## 2023-05-12 MED ORDER — ACETAMINOPHEN 500 MG PO TABS
ORAL_TABLET | ORAL | Status: AC
  Administered 2023-05-12: 1000 mg via ORAL
  Filled 2023-05-12: qty 2

## 2023-05-12 MED ORDER — BISACODYL 5 MG PO TBEC: 20.0000 mg | DELAYED_RELEASE_TABLET | Freq: Once | ORAL | Status: DC

## 2023-05-12 MED ORDER — LACTATED RINGERS IV SOLN: Freq: Three times a day (TID) | INTRAVENOUS | Status: DC | PRN

## 2023-05-12 MED ORDER — METOPROLOL TARTRATE 5 MG/5ML IV SOLN: 5.0000 mg | Freq: Four times a day (QID) | INTRAVENOUS | Status: DC | PRN

## 2023-05-12 MED ORDER — ENSURE PRE-SURGERY PO LIQD: 296.0000 mL | Freq: Once | ORAL | Status: DC

## 2023-05-12 MED ORDER — ACETAMINOPHEN 500 MG PO TABS
1000.0000 mg | ORAL_TABLET | ORAL | Status: AC
  Administered 2023-05-12: 1000 mg via ORAL
  Filled 2023-05-12: qty 2

## 2023-05-12 MED ORDER — ORAL CARE MOUTH RINSE: 15.0000 mL | Freq: Once | OROMUCOSAL | Status: AC

## 2023-05-12 MED ORDER — SODIUM CHLORIDE 0.9% FLUSH
3.0000 mL | Freq: Two times a day (BID) | INTRAVENOUS | Status: DC
  Administered 2023-05-13 – 2023-05-15 (×5): 3 mL via INTRAVENOUS

## 2023-05-12 MED ORDER — SIMETHICONE 80 MG PO CHEW
40.0000 mg | CHEWABLE_TABLET | Freq: Four times a day (QID) | ORAL | Status: DC | PRN
  Administered 2023-05-13: 40 mg via ORAL
  Filled 2023-05-12: qty 1

## 2023-05-12 MED ORDER — FENTANYL CITRATE PF 50 MCG/ML IJ SOSY
PREFILLED_SYRINGE | INTRAMUSCULAR | Status: AC
  Administered 2023-05-12: 50 ug
  Filled 2023-05-12: qty 1

## 2023-05-12 MED ORDER — SODIUM CHLORIDE 0.9 % IV SOLN: 12.5000 mg | INTRAVENOUS | Status: DC | PRN

## 2023-05-12 MED ORDER — PANTOPRAZOLE SODIUM 40 MG PO TBEC
40.0000 mg | DELAYED_RELEASE_TABLET | Freq: Every day | ORAL | Status: DC
  Administered 2023-05-13 – 2023-05-14 (×3): 40 mg via ORAL
  Filled 2023-05-12 (×3): qty 1

## 2023-05-12 MED ORDER — ALBUTEROL SULFATE HFA 108 (90 BASE) MCG/ACT IN AERS: 2.0000 | INHALATION_SPRAY | Freq: Four times a day (QID) | RESPIRATORY_TRACT | Status: DC | PRN

## 2023-05-12 MED ORDER — ACETAMINOPHEN 500 MG PO TABS: 1000.0000 mg | ORAL_TABLET | Freq: Once | ORAL | Status: DC

## 2023-05-12 MED ORDER — POLYETHYLENE GLYCOL 3350 17 GM/SCOOP PO POWD: 1.0000 | Freq: Once | ORAL | Status: DC

## 2023-05-12 MED ORDER — PHENYLEPHRINE 80 MCG/ML (10ML) SYRINGE FOR IV PUSH (FOR BLOOD PRESSURE SUPPORT)
PREFILLED_SYRINGE | INTRAVENOUS | Status: AC
  Filled 2023-05-12: qty 10

## 2023-05-12 MED ORDER — STERILE WATER FOR INJECTION IJ SOLN
INTRAMUSCULAR | Status: DC | PRN
  Administered 2023-05-12: 15 mL

## 2023-05-12 MED ORDER — LACTATED RINGERS IR SOLN
Status: DC | PRN
  Administered 2023-05-12: 1000 mL

## 2023-05-12 MED ORDER — MIDAZOLAM HCL 5 MG/5ML IJ SOLN
INTRAMUSCULAR | Status: DC | PRN
  Administered 2023-05-12: 2 mg via INTRAVENOUS

## 2023-05-12 MED ORDER — TRAMADOL HCL 50 MG PO TABS: 50.0000 mg | ORAL_TABLET | Freq: Four times a day (QID) | ORAL | 0 refills | Status: DC | PRN

## 2023-05-12 MED ORDER — KETAMINE HCL 50 MG/5ML IJ SOSY
PREFILLED_SYRINGE | INTRAMUSCULAR | Status: AC
  Filled 2023-05-12: qty 5

## 2023-05-12 MED ORDER — CHLORHEXIDINE GLUCONATE 0.12 % MT SOLN
15.0000 mL | Freq: Once | OROMUCOSAL | Status: AC
  Administered 2023-05-12: 15 mL via OROMUCOSAL

## 2023-05-12 MED ORDER — PROCHLORPERAZINE MALEATE 10 MG PO TABS: 10.0000 mg | ORAL_TABLET | Freq: Four times a day (QID) | ORAL | Status: DC | PRN

## 2023-05-12 MED ORDER — DEXAMETHASONE SODIUM PHOSPHATE 10 MG/ML IJ SOLN
INTRAMUSCULAR | Status: DC | PRN
  Administered 2023-05-12: 5 mg via INTRAVENOUS

## 2023-05-12 MED ORDER — ONDANSETRON HCL 4 MG PO TABS: 4.0000 mg | ORAL_TABLET | Freq: Four times a day (QID) | ORAL | Status: DC | PRN

## 2023-05-12 MED ORDER — TRAMADOL HCL 50 MG PO TABS
50.0000 mg | ORAL_TABLET | Freq: Four times a day (QID) | ORAL | Status: DC | PRN
  Administered 2023-05-12: 100 mg via ORAL
  Filled 2023-05-12: qty 2

## 2023-05-12 MED ORDER — SUGAMMADEX SODIUM 200 MG/2ML IV SOLN
INTRAVENOUS | Status: DC | PRN
  Administered 2023-05-12: 200 mg via INTRAVENOUS

## 2023-05-12 MED ORDER — PHENYLEPHRINE HCL-NACL 20-0.9 MG/250ML-% IV SOLN
INTRAVENOUS | Status: DC | PRN
  Administered 2023-05-12: 50 ug/min via INTRAVENOUS

## 2023-05-12 MED ORDER — ENSURE PRE-SURGERY PO LIQD: 592.0000 mL | Freq: Once | ORAL | Status: DC

## 2023-05-12 MED ORDER — 0.9 % SODIUM CHLORIDE (POUR BTL) OPTIME
TOPICAL | Status: DC | PRN
  Administered 2023-05-12 (×2): 1000 mL

## 2023-05-12 MED ORDER — SODIUM CHLORIDE 0.9 % IV SOLN
2.0000 g | Freq: Two times a day (BID) | INTRAVENOUS | Status: AC
  Administered 2023-05-13: 2 g via INTRAVENOUS
  Filled 2023-05-12: qty 2

## 2023-05-12 MED ORDER — BUPIVACAINE-EPINEPHRINE (PF) 0.25% -1:200000 IJ SOLN
INTRAMUSCULAR | Status: AC
  Filled 2023-05-12: qty 60

## 2023-05-12 MED ORDER — GABAPENTIN 100 MG PO CAPS
200.0000 mg | ORAL_CAPSULE | ORAL | Status: AC
  Administered 2023-05-12: 200 mg via ORAL
  Filled 2023-05-12: qty 2

## 2023-05-12 MED ORDER — DIPHENHYDRAMINE HCL 12.5 MG/5ML PO ELIX: 12.5000 mg | ORAL_SOLUTION | Freq: Four times a day (QID) | ORAL | Status: DC | PRN

## 2023-05-12 MED ORDER — HYDROMORPHONE HCL 1 MG/ML IJ SOLN
INTRAMUSCULAR | Status: AC
Start: 2023-05-12 — End: 2023-05-12
  Filled 2023-05-12: qty 1

## 2023-05-12 MED ORDER — TRAMADOL HCL 50 MG PO TABS
ORAL_TABLET | ORAL | Status: AC
  Filled 2023-05-12: qty 1

## 2023-05-12 MED ORDER — FENTANYL CITRATE PF 50 MCG/ML IJ SOSY
25.0000 ug | PREFILLED_SYRINGE | INTRAMUSCULAR | Status: DC | PRN
  Administered 2023-05-12 (×3): 50 ug via INTRAVENOUS

## 2023-05-12 MED ORDER — NAPHAZOLINE-GLYCERIN 0.012-0.25 % OP SOLN: 1.0000 [drp] | Freq: Four times a day (QID) | OPHTHALMIC | Status: DC | PRN

## 2023-05-12 MED ORDER — FENTANYL CITRATE PF 50 MCG/ML IJ SOSY
PREFILLED_SYRINGE | INTRAMUSCULAR | Status: AC
Start: 2023-05-12 — End: 2023-05-12
  Administered 2023-05-12: 50 ug
  Filled 2023-05-12: qty 1

## 2023-05-12 MED ORDER — MENTHOL 3 MG MT LOZG: 1.0000 | LOZENGE | OROMUCOSAL | Status: DC | PRN

## 2023-05-12 MED ORDER — MIDAZOLAM HCL 2 MG/2ML IJ SOLN
INTRAMUSCULAR | Status: AC
  Filled 2023-05-12: qty 2

## 2023-05-12 MED ORDER — HYDROMORPHONE HCL 1 MG/ML IJ SOLN
0.5000 mg | INTRAMUSCULAR | Status: DC | PRN
  Administered 2023-05-12: 2 mg via INTRAVENOUS
  Administered 2023-05-12: 1 mg via INTRAVENOUS
  Administered 2023-05-13 – 2023-05-15 (×10): 2 mg via INTRAVENOUS
  Filled 2023-05-12 (×11): qty 2

## 2023-05-12 MED ORDER — PROCHLORPERAZINE EDISYLATE 10 MG/2ML IJ SOLN: 5.0000 mg | Freq: Four times a day (QID) | INTRAMUSCULAR | Status: DC | PRN

## 2023-05-12 MED ORDER — METHOCARBAMOL 500 MG PO TABS
1000.0000 mg | ORAL_TABLET | Freq: Four times a day (QID) | ORAL | Status: DC | PRN
  Administered 2023-05-12 – 2023-05-14 (×5): 1000 mg via ORAL
  Filled 2023-05-12 (×4): qty 2

## 2023-05-12 MED ORDER — OXYCODONE HCL 5 MG/5ML PO SOLN: 5.0000 mg | Freq: Once | ORAL | Status: DC | PRN

## 2023-05-12 MED ORDER — PROPOFOL 10 MG/ML IV BOLUS
INTRAVENOUS | Status: DC | PRN
  Administered 2023-05-12: 200 mg via INTRAVENOUS

## 2023-05-12 MED ORDER — SALINE SPRAY 0.65 % NA SOLN
1.0000 | Freq: Four times a day (QID) | NASAL | Status: DC | PRN
Start: 2023-05-12 — End: 2023-05-15

## 2023-05-12 MED ORDER — ALVIMOPAN 12 MG PO CAPS
12.0000 mg | ORAL_CAPSULE | Freq: Two times a day (BID) | ORAL | Status: DC
  Administered 2023-05-13 – 2023-05-15 (×5): 12 mg via ORAL
  Filled 2023-05-12 (×5): qty 1

## 2023-05-12 MED ORDER — ENSURE SURGERY PO LIQD
237.0000 mL | Freq: Two times a day (BID) | ORAL | Status: DC
  Administered 2023-05-13 – 2023-05-15 (×4): 237 mL via ORAL

## 2023-05-12 MED ORDER — PROPOFOL 10 MG/ML IV BOLUS
INTRAVENOUS | Status: AC
  Filled 2023-05-12: qty 20

## 2023-05-12 MED ORDER — KCL IN DEXTROSE-NACL 20-5-0.45 MEQ/L-%-% IV SOLN
INTRAVENOUS | Status: AC
  Filled 2023-05-12: qty 1000

## 2023-05-12 MED ORDER — OXYCODONE HCL 5 MG PO TABS: 5.0000 mg | ORAL_TABLET | Freq: Once | ORAL | Status: DC | PRN

## 2023-05-12 MED ORDER — HYDRALAZINE HCL 20 MG/ML IJ SOLN: 10.0000 mg | INTRAMUSCULAR | Status: DC | PRN

## 2023-05-12 MED ORDER — ATORVASTATIN CALCIUM 10 MG PO TABS
10.0000 mg | ORAL_TABLET | Freq: Every day | ORAL | Status: DC
  Administered 2023-05-13 – 2023-05-15 (×3): 10 mg via ORAL
  Filled 2023-05-12 (×3): qty 1

## 2023-05-12 MED ORDER — ALUM & MAG HYDROXIDE-SIMETH 200-200-20 MG/5ML PO SUSP: 30.0000 mL | Freq: Four times a day (QID) | ORAL | Status: DC | PRN

## 2023-05-12 MED ORDER — METHOCARBAMOL 500 MG PO TABS
ORAL_TABLET | ORAL | Status: AC
  Filled 2023-05-12: qty 2

## 2023-05-12 MED ORDER — ALVIMOPAN 12 MG PO CAPS
12.0000 mg | ORAL_CAPSULE | ORAL | Status: AC
  Administered 2023-05-12: 12 mg via ORAL
  Filled 2023-05-12: qty 1

## 2023-05-12 MED ORDER — LIDOCAINE HCL 2 % IJ SOLN
INTRAMUSCULAR | Status: AC
  Filled 2023-05-12: qty 20

## 2023-05-12 SURGICAL SUPPLY — 104 items
ADAPTER GOLDBERG URETERAL (ADAPTER) IMPLANT
APPLIER CLIP 5 13 M/L LIGAMAX5 (MISCELLANEOUS) IMPLANT
APPLIER CLIP ROT 10 11.4 M/L (STAPLE) IMPLANT
BAG COUNTER SPONGE SURGICOUNT (BAG) ×1 IMPLANT
BAG URO CATCHER STRL LF (MISCELLANEOUS) ×1 IMPLANT
BLADE EXTENDED COATED 6.5IN (ELECTRODE) IMPLANT
CANNULA REDUCER 12-8 DVNC XI (CANNULA) IMPLANT
CATH URETL OPEN END 6FR 70 (CATHETERS) IMPLANT
CELLS DAT CNTRL 66122 CELL SVR (MISCELLANEOUS) IMPLANT
CHLORAPREP W/TINT 26 (MISCELLANEOUS) IMPLANT
CLIP APPLIE 5 13 M/L LIGAMAX5 (MISCELLANEOUS) IMPLANT
CLIP APPLIE ROT 10 11.4 M/L (STAPLE) IMPLANT
CLOTH BEACON ORANGE TIMEOUT ST (SAFETY) ×1 IMPLANT
COVER SURGICAL LIGHT HANDLE (MISCELLANEOUS) ×2 IMPLANT
COVER TIP SHEARS 8 DVNC (MISCELLANEOUS) ×1 IMPLANT
DEVICE TROCAR PUNCTURE CLOSURE (ENDOMECHANICALS) IMPLANT
DRAIN CHANNEL 19F RND (DRAIN) IMPLANT
DRAPE ARM DVNC X/XI (DISPOSABLE) ×4 IMPLANT
DRAPE COLUMN DVNC XI (DISPOSABLE) ×1 IMPLANT
DRAPE SURG IRRIG POUCH 19X23 (DRAPES) ×1 IMPLANT
DRIVER NDL LRG 8 DVNC XI (INSTRUMENTS) ×1 IMPLANT
DRIVER NDLE LRG 8 DVNC XI (INSTRUMENTS) ×1 IMPLANT
DRSG OPSITE POSTOP 4X10 (GAUZE/BANDAGES/DRESSINGS) IMPLANT
DRSG OPSITE POSTOP 4X6 (GAUZE/BANDAGES/DRESSINGS) IMPLANT
DRSG OPSITE POSTOP 4X8 (GAUZE/BANDAGES/DRESSINGS) IMPLANT
DRSG TEGADERM 2-3/8X2-3/4 SM (GAUZE/BANDAGES/DRESSINGS) ×5 IMPLANT
DRSG TEGADERM 4X4.75 (GAUZE/BANDAGES/DRESSINGS) IMPLANT
ELECT PENCIL ROCKER SW 15FT (MISCELLANEOUS) ×1 IMPLANT
ELECT REM PT RETURN 15FT ADLT (MISCELLANEOUS) ×1 IMPLANT
ENDOLOOP SUT PDS II 0 18 (SUTURE) IMPLANT
EVACUATOR SILICONE 100CC (DRAIN) IMPLANT
GAUZE SPONGE 2X2 8PLY STRL LF (GAUZE/BANDAGES/DRESSINGS) ×1 IMPLANT
GLOVE ECLIPSE 8.0 STRL XLNG CF (GLOVE) ×3 IMPLANT
GLOVE INDICATOR 8.0 STRL GRN (GLOVE) ×3 IMPLANT
GLOVE SURG LX STRL 7.5 STRW (GLOVE) ×1 IMPLANT
GOWN SRG XL LVL 4 BRTHBL STRL (GOWNS) ×1 IMPLANT
GOWN STRL REUS W/ TWL XL LVL3 (GOWN DISPOSABLE) ×4 IMPLANT
GRASPER SUT TROCAR 14GX15 (MISCELLANEOUS) IMPLANT
GRASPER TIP-UP FEN DVNC XI (INSTRUMENTS) ×1 IMPLANT
GUIDEWIRE ANG ZIPWIRE 038X150 (WIRE) IMPLANT
GUIDEWIRE STR DUAL SENSOR (WIRE) IMPLANT
HOLDER FOLEY CATH W/STRAP (MISCELLANEOUS) ×1 IMPLANT
IRRIG SUCT STRYKERFLOW 2 WTIP (MISCELLANEOUS) ×1 IMPLANT
IRRIGATION SUCT STRKRFLW 2 WTP (MISCELLANEOUS) ×1 IMPLANT
KIT PROCEDURE DVNC SI (MISCELLANEOUS) ×1 IMPLANT
KIT SIGMOIDOSCOPE (SET/KITS/TRAYS/PACK) IMPLANT
KIT TURNOVER KIT A (KITS) IMPLANT
MANIFOLD NEPTUNE II (INSTRUMENTS) ×1 IMPLANT
NDL INSUFFLATION 14GA 120MM (NEEDLE) ×1 IMPLANT
NEEDLE INSUFFLATION 14GA 120MM (NEEDLE) ×1 IMPLANT
PACK CARDIOVASCULAR III (CUSTOM PROCEDURE TRAY) ×1 IMPLANT
PACK COLON (CUSTOM PROCEDURE TRAY) ×1 IMPLANT
PACK CYSTO (CUSTOM PROCEDURE TRAY) ×1 IMPLANT
PAD POSITIONING PINK XL (MISCELLANEOUS) ×1 IMPLANT
PROTECTOR NERVE ULNAR (MISCELLANEOUS) ×2 IMPLANT
RELOAD STAPLE 45 3.5 BLU DVNC (STAPLE) IMPLANT
RELOAD STAPLE 45 4.3 GRN DVNC (STAPLE) IMPLANT
RELOAD STAPLE 60 3.5 BLU DVNC (STAPLE) IMPLANT
RELOAD STAPLE 60 4.3 GRN DVNC (STAPLE) IMPLANT
RELOAD STAPLER 3.5X45 BLU DVNC (STAPLE) IMPLANT
RELOAD STAPLER 3.5X60 BLU DVNC (STAPLE) IMPLANT
RELOAD STAPLER 4.3X45 GRN DVNC (STAPLE) IMPLANT
RELOAD STAPLER 4.3X60 GRN DVNC (STAPLE) ×1 IMPLANT
RETRACTOR WND ALEXIS 18 MED (MISCELLANEOUS) IMPLANT
RTRCTR WOUND ALEXIS 18CM MED (MISCELLANEOUS) IMPLANT
SCISSORS LAP 5X35 DISP (ENDOMECHANICALS) ×1 IMPLANT
SCISSORS MNPLR CVD DVNC XI (INSTRUMENTS) ×1 IMPLANT
SEAL UNIV 5-12 XI (MISCELLANEOUS) ×4 IMPLANT
SEALER VESSEL EXT DVNC XI (MISCELLANEOUS) ×1 IMPLANT
SOL ELECTROSURG ANTI STICK (MISCELLANEOUS) ×1 IMPLANT
SOLUTION ELECTROSURG ANTI STCK (MISCELLANEOUS) ×1 IMPLANT
SPIKE FLUID TRANSFER (MISCELLANEOUS) ×1 IMPLANT
STAPLER 45 SUREFORM DVNC (STAPLE) IMPLANT
STAPLER 60 SUREFORM DVNC (STAPLE) IMPLANT
STAPLER ECHELON POWER CIR 29 (STAPLE) IMPLANT
STAPLER ECHELON POWER CIR 31 (STAPLE) IMPLANT
STAPLER RELOAD 3.5X45 BLU DVNC (STAPLE) IMPLANT
STAPLER RELOAD 3.5X60 BLU DVNC (STAPLE) IMPLANT
STAPLER RELOAD 4.3X45 GRN DVNC (STAPLE) IMPLANT
STAPLER RELOAD 4.3X60 GRN DVNC (STAPLE) ×1 IMPLANT
STOPCOCK 4 WAY LG BORE MALE ST (IV SETS) ×2 IMPLANT
SURGILUBE 2OZ TUBE FLIPTOP (MISCELLANEOUS) IMPLANT
SUT MNCRL AB 4-0 PS2 18 (SUTURE) ×1 IMPLANT
SUT PDS AB 1 CT1 27 (SUTURE) ×2 IMPLANT
SUT PROLENE 0 CT 2 (SUTURE) IMPLANT
SUT PROLENE 2 0 KS (SUTURE) IMPLANT
SUT PROLENE 2 0 SH DA (SUTURE) IMPLANT
SUT SILK 2 0 SH CR/8 (SUTURE) IMPLANT
SUT SILK 3 0 SH CR/8 (SUTURE) ×1 IMPLANT
SUT V-LOC BARB 180 2/0GR6 GS22 (SUTURE) IMPLANT
SUT VIC AB 3-0 SH 18 (SUTURE) IMPLANT
SUT VIC AB 3-0 SH 27XBRD (SUTURE) IMPLANT
SUT VICRYL 0 UR6 27IN ABS (SUTURE) IMPLANT
SUTURE V-LC BRB 180 2/0GR6GS22 (SUTURE) IMPLANT
SYR 20ML ECCENTRIC (SYRINGE) ×1 IMPLANT
SYS LAPSCP GELPORT 120MM (MISCELLANEOUS) IMPLANT
SYS WOUND ALEXIS 18CM MED (MISCELLANEOUS) ×1 IMPLANT
SYSTEM LAPSCP GELPORT 120MM (MISCELLANEOUS) IMPLANT
SYSTEM WOUND ALEXIS 18CM MED (MISCELLANEOUS) ×1 IMPLANT
TRAY FOLEY MTR SLVR 16FR STAT (SET/KITS/TRAYS/PACK) ×1 IMPLANT
TROCAR ADV FIXATION 5X100MM (TROCAR) ×1 IMPLANT
TUBING CONNECTING 10 (TUBING) ×3 IMPLANT
TUBING INSUFFLATION 10FT LAP (TUBING) ×1 IMPLANT
TUBING UROLOGY SET (TUBING) IMPLANT

## 2023-05-12 NOTE — Transfer of Care (Signed)
 Immediate Anesthesia Transfer of Care Note  Patient: Marcus Andrews  Procedure(s) Performed: XI ROBOT ASSISTED SIGMOID COLECTOMY FLEXIBLE SIGMOIDOSCOPY CYSTOSCOPY with FIREFLY INJECTION  Patient Location: PACU  Anesthesia Type:General  Level of Consciousness: awake, alert , oriented, and patient cooperative  Airway & Oxygen Therapy: Patient Spontanous Breathing and Patient connected to face mask oxygen  Post-op Assessment: Report given to RN, Post -op Vital signs reviewed and stable, and Patient moving all extremities  Post vital signs: Reviewed and stable  Last Vitals:  Vitals Value Taken Time  BP 149/121 05/12/23 1605  Temp    Pulse 72 05/12/23 1609  Resp 16 05/12/23 1609  SpO2 100 % 05/12/23 1609  Vitals shown include unfiled device data.  Last Pain:  Vitals:   05/12/23 1035  TempSrc: Oral         Complications: No notable events documented.

## 2023-05-12 NOTE — Op Note (Signed)
 Operative Note  Preoperative diagnosis:  1.  Diverticulitis  Postoperative diagnosis: 1.  Diverticulitis  Procedure(s): 1.  Cystoscopy with bilateral ureteral FireFly injections  Surgeon: Modena Slater, MD  Assistants:  None   Anesthesia:  General  Complications:  None  EBL:  <5 mL  Specimens: 1. None  Drains/Catheters: 1.  16 French Foley  Intraoperative findings:   No intravesical pathology was seen on cystoscopy  Indication:  The patient is a 21 with a history of male requiring a partial colectomy with Dr. [name].  Urology has been consulted to performed cystoscopy with bilateral ureteral Fire Fly injection to aide in intraoperative ureteral identification. The patient has been consented for the above procedures, voices understanding and wishes to proceed.  The patient has been consented for the above procedures, voices understanding and wishes to procede  Description of procedure: After informed consent was obtained, the patient was brought to the operating room and general endotracheal anesthesia was administered. The patient was then placed in the dorsolithotomy position and prepped and draped in usual sterile fashion. A timeout was performed. A 21 French rigid cystoscope was then inserted into the urethral meatus and advanced into the bladder under direct vision. A complete bladder survey revealed no intravesical pathology.   A sensor wire was advanced up the left ureter followed by advancement of a 6 [place] open-ended catheter over the wire and up the ureter.  Wire was withdrawn and a total of 7.5 mL of firefly solution diluted with 10 mL of saline was injected into the left collecting system. A similar maneuver was then carried out on the right with the same volume and concentration of firefly. The rigid cystoscope was then removed under direct vision. A 16 French Foley catheter was then inserted and placed to gravity drainage. The patient tolerated the procedure well. Dr.  [name] then proceeded with their portion of the case  Plan:  Foley removal is at the discretion of the primary team.

## 2023-05-12 NOTE — Anesthesia Postprocedure Evaluation (Signed)
 Anesthesia Post Note  Patient: JOSIA CUEVA  Procedure(s) Performed: XI ROBOT ASSISTED SIGMOID COLECTOMY FLEXIBLE SIGMOIDOSCOPY CYSTOSCOPY with FIREFLY INJECTION     Patient location during evaluation: PACU Anesthesia Type: General Level of consciousness: awake and alert Pain management: pain level controlled Vital Signs Assessment: post-procedure vital signs reviewed and stable Respiratory status: spontaneous breathing, nonlabored ventilation and respiratory function stable Cardiovascular status: stable and blood pressure returned to baseline Anesthetic complications: no   No notable events documented.  Last Vitals:  Vitals:   05/12/23 1730 05/12/23 1738  BP: (!) 168/101   Pulse: 70 78  Resp: 17 13  Temp:    SpO2: 95% 90%    Last Pain:  Vitals:   05/12/23 1734  TempSrc:   PainSc: Asleep                 Beryle Lathe

## 2023-05-12 NOTE — Interval H&P Note (Signed)
 History and Physical Interval Note:  05/12/2023 12:06 PM  Marcus Andrews  has presented today for surgery, with the diagnosis of DIVERTICULITIS.  The various methods of treatment have been discussed with the patient and family. After consideration of risks, benefits and other options for treatment, the patient has consented to  Procedure(s): XI ROBOT ASSISTED SIGMOID COLECTOMY (N/A) FLEXIBLE SIGMOIDOSCOPY (N/A) CYSTOSCOPY with FIREFLY INJECTION (N/A) as a surgical intervention.  The patient's history has been reviewed, patient examined, no change in status, stable for surgery.  I have reviewed the patient's chart and labs.  Questions were answered to the patient's satisfaction.    I have re-reviewed the the patient's records, history, medications, and allergies.  I have re-examined the patient.  I again discussed intraoperative plans and goals of post-operative recovery.  The patient agrees to proceed.  Marcus Andrews  04-18-1960 [id number]  Patient Care Team: Mort Sawyers, FNP as PCP - General (Family Medicine) Meryl Dare, MD (Inactive) as Consulting Physician (Gastroenterology) Karie Soda, MD as Consulting Physician (General Surgery)  Patient Active Problem List   Diagnosis Date Noted   Weight loss 03/31/2023   Aortic atherosclerosis (HCC) 03/31/2023   Hypokalemia 03/19/2023   Heartburn 03/18/2023   Diverticulitis of large intestine with abscess 03/16/2023   Colonic diverticular abscess 03/16/2023   Transfusion of blood product refused for religious reason (Jehovah witness) 02/08/2023   Diverticulitis of colon with perforation 12/01/2022   Family history of heart attack 08/26/2022   Diverticulosis 07/15/2022   Snoring 07/15/2022   Prediabetes 05/23/2021   Osteoarthritis of knee 05/08/2019   Erectile dysfunction 08/19/2012   Dyslipidemia 06/07/2012   Lung nodule, solitary 06/06/2012   Allergic rhinitis 07/02/2008    Past Medical History:  Diagnosis Date    Arthritis    Colon polyp    Diverticulosis    Pre-diabetes    Refusal of blood transfusions as patient is Jehovah's Witness     Past Surgical History:  Procedure Laterality Date   COLONOSCOPY     IR RADIOLOGIST EVAL & MGMT  03/30/2023   KNEE ARTHROSCOPY WITH MEDIAL MENISECTOMY Right 02/05/2020   Procedure: Right knee arthroscopy with partial medial and lateral menisectomy, chondroplasty, partial synovectomy, removal loose body;  Surgeon: Lyndle Herrlich, MD;  Location: [facility] ORS;  Service: Orthopedics;  Laterality: Right;   MENISCUS REPAIR Left 01/2014   TOOTH EXTRACTION     UPPER GASTROINTESTINAL ENDOSCOPY  05/29/2021    Social History   Socioeconomic History   Marital status: Single    Spouse name: Not on file   Number of children: 1   Years of education: Not on file   Highest education level: Not on file  Occupational History   Occupation: owner/operator  Tobacco Use   Smoking status: Former    Current packs/day: 0.00    Types: Cigarettes    Quit date: 05/28/2018    Years since quitting: 4.9   Smokeless tobacco: Never  Vaping Use   Vaping status: Never Used  Substance and Sexual Activity   Alcohol use: Not Currently    Alcohol/week: 1.0 standard drink of alcohol    Types: 1 Cans of beer per week   Drug use: Never   Sexual activity: Not Currently    Birth control/protection: Condom  Other Topics Concern   Not on file  Social History Narrative   05/22/21   From: the area   Living: alone   Work: Teacher, adult education      Family: family is all  over the place, has cousins which are local in [place]      Enjoys: learning to play guitar, DJ, music, aviation      Exercise: knee/back pain limit this   Diet: could be better - avoids red meats/pork      Safety   Seat belts: Yes    Guns: No   Safe in relationships: Yes       Daughter: 50   Social Drivers of Corporate investment banker Strain: Not on file  Food Insecurity: No Food Insecurity  (03/22/2023)   Hunger Vital Sign    Worried About Running Out of Food in the Last Year: Never true    Ran Out of Food in the Last Year: Never true  Transportation Needs: No Transportation Needs (03/22/2023)   PRAPARE - Administrator, Civil Service (Medical): No    Lack of Transportation (Non-Medical): No  Physical Activity: Not on file  Stress: Not on file  Social Connections: Not on file  Intimate Partner Violence: Not At Risk (03/22/2023)   Humiliation, Afraid, Rape, and Kick questionnaire    Fear of Current or Ex-Partner: No    Emotionally Abused: No    Physically Abused: No    Sexually Abused: No    Family History  Problem Relation Age of Onset   Breast cancer Mother    Colon cancer Mother 26   Colon polyps Mother    Lymphoma Mother    Heart disease Father    Heart failure Father    Heart attack Father 36   Hypertension Sister    Hyperlipidemia Sister    Asthma Sister    Aneurysm Sister    Hypertension Brother    Hyperlipidemia Brother    Deep vein thrombosis Brother    Heart attack Brother        50-60   Hypertension Maternal Grandmother    Diabetes Maternal Grandmother    Hypertension Maternal Aunt    Cancer Maternal Aunt        type unknown   Hypertension Maternal Uncle    Liver cancer Maternal Uncle    Hypertension Paternal Aunt    Hypertension Paternal Uncle    Esophageal cancer Neg Hx    Rectal cancer Neg Hx    Stomach cancer Neg Hx     Medications Prior to Admission  Medication Sig Dispense Refill Last Dose/Taking   acetaminophen (TYLENOL) 325 MG tablet Take 2 tablets (650 mg total) by mouth every 6 (six) hours as needed for mild pain, fever or headache.   Past Week   atorvastatin (LIPITOR) 10 MG tablet Take 1 tablet (10 mg total) by mouth daily. (Patient not taking: Reported on 03/16/2023) 90 tablet 3 Taking   Ensure (ENSURE) Take 237 mLs by mouth daily as needed (for supplementation- or, Boost). Or boost   Taking As Needed   glycopyrrolate  (ROBINUL) 1 MG tablet Take 1 tablet (1 mg total) by mouth 2 (two) times daily. 60 tablet 11 Past Week   lactose free nutrition (BOOST) LIQD Take 237 mLs by mouth daily as needed (for supplementation- or, Ensure).   Past Week   Magnesium 250 MG TABS Take 250 mg by mouth in the morning.   Past Week   omeprazole (PRILOSEC) 40 MG capsule TAKE 1 CAPSULE (40 MG TOTAL) BY MOUTH DAILY. (Patient taking differently: Take 40 mg by mouth daily before breakfast.) 30 capsule 8 Past Week   traMADol (ULTRAM) 50 MG tablet Take 1-2 tablets (50-100 mg  total) by mouth every 8 (eight) hours as needed for severe pain (pain score 7-10) or moderate pain (pain score 4-6) (for pain). 20 tablet 0 Past Week   albuterol (VENTOLIN HFA) 108 (90 Base) MCG/ACT inhaler TAKE 2 PUFFS BY MOUTH EVERY 6 HOURS AS NEEDED FOR WHEEZE OR SHORTNESS OF BREATH (Patient taking differently: Inhale 2 puffs into the lungs every 6 (six) hours as needed for shortness of breath or wheezing.) 18 each 0 Unknown   sodium chloride flush 0.9 % SOLN injection Use to flush abdominal drain with 5 mL sterile saline once daily 300 mL 3     Current Facility-Administered Medications  Medication Dose Route Frequency Provider Last Rate Last Admin   bupivacaine liposome (EXPAREL) 1.3 % injection 266 mg  20 mL Infiltration Once Karie Soda, MD       cefoTEtan (CEFOTAN) 2 g in sodium chloride 0.9 % 100 mL IVPB  2 g Intravenous On Call to OR Karie Soda, MD       scopolamine (TRANSDERM-SCOP) 1 MG/3DAYS 1.5 mg  1 patch Transdermal Q72H Beryle Lathe, MD   1.5 mg at 05/12/23 1112     No Known Allergies  BP 128/86   Pulse 67   Temp 98.7 F (37.1 C) (Oral)   Resp 16   Ht 6' (1.829 m)   Wt 81.6 kg   SpO2 98%   BMI 24.41 kg/m   Labs: No results found for this or any previous visit (from the past 48 hours).  Imaging / Studies: CT CHEST LUNG CA SCREEN LOW DOSE W/O CM Result Date: 04/24/2023 CLINICAL DATA:  Former 60 pack-year smoker, quit 2020. EXAM: CT  CHEST WITHOUT CONTRAST LOW-DOSE FOR LUNG CANCER SCREENING TECHNIQUE: Multidetector CT imaging of the chest was performed following the standard protocol without IV contrast. RADIATION DOSE REDUCTION: This exam was performed according to the departmental dose-optimization program which includes automated exposure control, adjustment of the mA and/or kV according to patient size and/or use of iterative reconstruction technique. COMPARISON:  None Available. FINDINGS: Cardiovascular: Atherosclerotic calcification of the aorta. Heart is at the upper limits of normal in size. No pericardial effusion. Mediastinum/Nodes: No pathologically enlarged mediastinal or axillary lymph nodes. Hilar regions are difficult to definitively evaluate without IV contrast. Esophagus is grossly unremarkable. Lungs/Pleura: Centrilobular and paraseptal emphysema. Minimal scattered pulmonary parenchymal scarring. No suspicious pulmonary nodules. No pleural fluid. Airway is unremarkable. Upper Abdomen: Visualized portions of the liver, gallbladder, adrenal glands, kidneys, spleen, pancreas, stomach and bowel are grossly unremarkable. No upper abdominal adenopathy. Musculoskeletal: Degenerative changes in the spine. IMPRESSION: 1. Lung-RADS 1, negative. Continue annual screening with low-dose chest CT without contrast in 12 months. 2.  Aortic atherosclerosis (ICD10-I70.0). 3.  Emphysema (ICD10-J43.9). Electronically Signed   By: Leanna Battles M.D.   On: 04/24/2023 13:40     .Ardeth Sportsman, M.D., F.A.C.S. Gastrointestinal and Minimally Invasive Surgery [facility] [address], [address] [place], [place] [address] [phone] Main / Paging  05/12/2023 12:06 PM    Ardeth Sportsman

## 2023-05-12 NOTE — H&P (Signed)
 05/12/2023    REFERRING PHYSICIAN: Self  Patient Care Team: [name], NP as PCP - General [name], MD (Gastroenterology) [name], [name], MD as Consulting Provider (General Surgery)  PROVIDER: Jarrett Soho, MD  [facility] MRN: [id number] DOB: 08-25-1960  SUBJECTIVE   Chief Complaint: RE-CHECK   Marcus Andrews is a 63 y.o. male  who is seen today as an office consultation  at the request of Dr.. Self  for evaluation of recurrent diverticulitis  History of Present Illness:  Patient returns. History of diverticulitis with some sigmoid narrowing/stricture and near obstruction. I had seen him in November and patient was due for elective colectomy in January. He felt worse and was admitted with recurrent diverticulitis with drain placement around New Year's eve. Improved. Had an outpatient follow-up drain study interventional radiology on January 14. Abscess resolved with no fistula so drain was removed. Patient is rescheduled to get colectomy later this month, 05/12/2023. Here for postadmission checkup to make sure he is doing okay. His appetites been all right. He has been trying to avoid red meat and focus on leaner proteins. He has been taking fiber Gummies. He had an episode of cramping yesterday that worried him about another attack but feels better today. He is worried he could get another infection before his surgery later this month. He remains abstinent on tobacco.  PRIOR VISIT 02/08/2023 63 year old male. History of lower abdominal pain and known diverticulosis. Episodes of diverticulitis. He thinks he has had at least 5 attacks. Followed by Dr. Russella Dar with [facility] gastroenterology. Had a colonoscopy last month which noted some sigmoid narrowing but not a tight stricture. Concerning for chronic diverticulitis. He has had flares documented by CAT scan in 2021, 2023, September 2024. That last was the worst attack and he was in the hospital on IV antibiotics for  a week. Eventually stabilized but still's feels rather sore. Had recurrent symptoms last month. Placed back on antibiotics. With recurrent symptoms, surgical consultation recommended to consider segmental resection.  Patient has never had any abdominal surgery. Does not smoke for the past 4 years. Can walk half hour without difficulty. No cardiac or pulmonary issues. No diabetes. Usually moves his bowels twice a day. No history of Crohn's or inflammatory bowel disease. Otherwise healthy and active. Just rather frustrated with repeated flares. Seems to be happening a little more often and more intensely.  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  Medical History:  Past Medical History:  Diagnosis Date  Colon polyps  Hyperlipidemia   Patient Active Problem List  Diagnosis  S/P medial meniscectomy of left knee  Diverticulitis of colon with perforation  Diverticulosis  Prediabetes  Transfusion of blood product refused for religious reason  History of diverticular abscess of colon   Past Surgical History:  Procedure Laterality Date  KNEE ARTHROSCOPY Left  04/03/2014    No Known Allergies  Current Outpatient Medications on File Prior to Visit  Medication Sig Dispense Refill  glycopyrrolate (ROBINUL) 1 mg tablet Take 1 tablet by mouth 2 (two) times daily  omeprazole (PRILOSEC) 40 MG DR capsule Take 40 mg by mouth once daily  traMADol (ULTRAM) 50 mg tablet Take 1 tablet (50 mg total) by mouth every 8 (eight) hours as needed for severe pain. 30 tablet 0  valACYclovir (VALTREX) 1000 MG tablet valacyclovir 1 gram tablet TAKE 1 TABLET BY MOUTH TWICE DAILY FOR 10 DAYS AS NEEDED FOR OUTBREAK  meclizine (ANTIVERT) 25 mg tablet Take 25 mg by mouth  3 (three) times daily as needed  tadalafil (CIALIS) 5 MG tablet Take 1 tablet (5 mg total) by mouth prior to sexual activity. 30 tablet 0  varenicline (CHANTIX) 1 mg tablet Take 1/2 tablet daily for 3 days, the twice daily for 4 days.  Then take 1 tablet (1 mg total) by mouth 2 (two) times daily. (Patient not taking: Reported on 04/11/2021) 60 tablet 2   No current facility-administered medications on file prior to visit.   Family History  Problem Relation Age of Onset  Colon cancer Mother 29  Breast cancer Mother  Heart disease Father 73  High blood pressure (Hypertension) Father  Coronary Artery Disease (Blocked arteries around heart) Father 57  Hyperlipidemia (Elevated cholesterol) Sister  Hyperlipidemia (Elevated cholesterol) Brother  Diabetes type II Maternal Grandmother  Stroke Maternal Grandmother  Prostate cancer Neg Hx    Social History   Tobacco Use  Smoking Status Former  Current packs/day: 0.00  Average packs/day: 0.5 packs/day for 34.0 years (17.0 ttl pk-yrs)  Types: Cigarettes  Start date: 53  Quit date: 2020  Years since quitting: 5.1  Smokeless Tobacco Former    Social History   Socioeconomic History  Marital status: Single  Tobacco Use  Smoking status: Former  Current packs/day: 0.00  Average packs/day: 0.5 packs/day for 34.0 years (17.0 ttl pk-yrs)  Types: Cigarettes  Start date: 28  Quit date: 2020  Years since quitting: 5.1  Smokeless tobacco: Former  Advertising account planner status: Unknown  Substance and Sexual Activity  Alcohol use: Yes  Alcohol/week: 4.0 standard drinks of alcohol  Types: 4 Standard drinks or equivalent per week  Drug use: Defer  Sexual activity: Yes  Birth control/protection: None   Social Drivers of Health   Food Insecurity: No Food Insecurity (03/22/2023)  Received from [facility]  Hunger Vital Sign  Worried About Running Out of Food in the Last Year: Never true  Ran Out of Food in the Last Year: Never true  Transportation Needs: No Transportation Needs (03/22/2023)  Received from [facility] - Transportation  Lack of Transportation (Medical): No  Lack of Transportation (Non-Medical): No  Housing Stability: Unknown (04/21/2023)  Housing  Stability Vital Sign  Homeless in the Last Year: No   ############################################################  Review of Systems: A complete review of systems (ROS) was obtained from the patient.  We have reviewed this information and discussed as appropriate with the patient.  See HPI as well for other pertinent ROS.  Constitutional: No fevers, chills, sweats. Weight stable Eyes: No vision changes, No discharge HENT: No sore throats, nasal drainage Lymph: No neck swelling, No bruising easily Pulmonary: No cough, productive sputum CV: No orthopnea, PND . No exertional chest/neck/shoulder/arm pain. Patient can walk 2-3 miles without difficulty.   GI: No personal nor family history of GI/colon cancer, inflammatory bowel disease, irritable bowel syndrome, allergy such as Celiac Sprue, dietary/dairy problems, colitis, ulcers nor gastritis. No recent sick contacts/gastroenteritis. No travel outside the country. No changes in diet.  Renal: No UTIs, No hematuria Genital: No drainage, bleeding, masses Musculoskeletal: No severe joint pain. Good ROM major joints Skin: No sores or lesions Heme/Lymph: No easy bleeding. No swollen lymph nodes Neuro: No active seizures. No facial droop Psych: No hallucinations. No agitation  OBJECTIVE   Vitals:  04/21/23 1451  PainSc: 0-No pain  PainLoc: Abdomen   There is no height or weight on file to calculate BMI.  PHYSICAL EXAM:  Constitutional: Not cachectic. Hygeine adequate. Vitals signs as above.  Eyes: Wears glasses - vision corrected,Pupils reactive, normal extraocular movements. Sclera nonicteric Neuro: CN II-XII intact. No major focal sensory defects. No major motor deficits. Lymph: No head/neck/groin lymphadenopathy Psych: No severe agitation. No severe anxiety. Judgment & insight Adequate, Oriented x4, HENT: Normocephalic, Mucus membranes moist. No thrush. Hearing: adequate Neck: Supple, No tracheal deviation. No obvious  thyromegaly Chest: No pain to chest wall compression. Good respiratory excursion. No audible wheezing CV: Pulses intact. regular. No major extremity edema Ext: No obvious deformity or contracture. Edema: Not present. No cyanosis Skin: No major subcutaneous nodules. Warm and dry Musculoskeletal: Severe joint rigidity not present. No obvious clubbing. No digital petechiae. Mobility: no assist device moving easily without restrictions  Abdomen: Flat Soft. Nondistended. Nontender. Hernia: Not present. Diastasis recti: Not present. No hepatomegaly. No splenomegaly.  Genital/Pelvic: Inguinal hernia: Not present. Inguinal lymph nodes: without lymphadenopathy nor hidradenitis.   Rectal: (Deferred) given recent colonoscopy    ###################################################################  Labs, Imaging and Diagnostic Testing:  Located in 'Care Everywhere' section of Epic EMR chart  PRIOR [facility] CLINIC NOTES:  Located in 'Care Everywhere' section of Epic EMR chart  SURGERY NOTES:  Not applicable  PATHOLOGY:  Not applicable  Assessment and Plan:  DIAGNOSES:  Diagnoses and all orders for this visit:  History of diverticular abscess of colon  Diverticulitis of colon with perforation  Transfusion of blood product refused for religious reason  Other orders - neomycin 500 mg tablet; Take 2 tablets (1,000 mg total) by mouth 3 (three) times daily for 3 doses SEE BOWEL PREP INSTRUCTIONS: Take 2 tablets at 2pm, 3pm, and 10pm the day prior to your colon operation. - metroNIDAZOLE (FLAGYL) 500 MG tablet; Take 2 tablets (1,000 mg total) by mouth 3 (three) times daily for 3 doses SEE BOWEL PREP INSTRUCTIONS: Take 2 tablets at 2pm, 3pm, and 10pm the day prior to your colon operation. - polyethylene glycol (MIRALAX) powder; Take 233.75 g by mouth once for 1 dose Take according to your procedure prep instructions. - bisacodyL (DULCOLAX) 5 mg EC tablet; Take 4 tablets (20 mg total) by mouth  once daily as needed for Constipation for up to 1 dose - ondansetron (ZOFRAN) 4 MG tablet; Take 1 tablet (4 mg total) by mouth every 8 (eight) hours as needed for Nausea - amoxicillin-clavulanate (AUGMENTIN) 875-125 mg tablet; Take 1 tablet (875 mg total) by mouth every 12 (twelve) hours for 5 days    ASSESSMENT/PLAN  Pleasant active gentleman with recurrent episodes of diverticulitis with more recent admission for perforation of mid sigmoid colon into retroperitoneal pelvis gradually improved with antibiotics. Current symptoms with readmission and drain placement for abscess New Year's Eve. Follow-up drain study 2 weeks later with abscess resolved and no fistula and drain removed 03/31/2023.   End stage diverticulitis with recent episodes of perforation and abscess formation and stricturing. I again feel he would benefit from surgery. He is a good candidate for robotic minimally invasive resection. I think it would be wise to get urology to firefly given the prior abscess was sitting on the left ureter / pelvis retroperitoneal region.  The anatomy & physiology of the digestive tract was discussed. The pathophysiology of the colon was discussed. Natural history risks without surgery was discussed. I feel the risks of no intervention will lead to serious problems that outweigh the operative risks; therefore, I recommended a partial colectomy to remove the pathology. Minimally invasive (Robotic/Laparoscopic) & open techniques were discussed.   Risks such as bleeding, infection, abscess, leak, reoperation, injury to  other organs, need for repair of tissues / organs, possible ostomy, hernia, heart attack, stroke, death, and other risks were discussed. I noted a good likelihood this will help address the problem. Goals of post-operative recovery were discussed as well. Need for adequate nutrition, daily bowel regimen and healthy physical activity, to optimize recovery was noted as well. We will work to  minimize complications. Educational materials were available as well. Questions were answered. The patient expresses understanding & wishes to proceed with surgery.  Continue fiber Gummies to keep his bowels soft and avoid constipation. He seems mostly reassured.  He is a Scientist, product/process development. He declines all blood products. The risk of bleeding with this should be rather low. He is not severely anemic. I noted it would be wise to take a multivitamin with iron and vitamin C to help his bone marrow stores be good and minimize the chance of anemia.    Ardeth Sportsman, MD, FACS, MASCRS Esophageal, Gastrointestinal & Colorectal Surgery Robotic and Minimally Invasive Surgery  [facility] [address], [address] [place], [place] [address] [phone] Fax [phone] Main  CONTACT INFORMATION: Weekday (9AM-5PM): Call [facility] main office at [phone] Weeknight (5PM-9AM) or Weekend/Holiday: Check EPIC "Web Links" tab & use "AMION" (password " [email or web address]") for General Surgery [facility] coverage  Please, DO NOT use SecureChat  (it is not reliable communication to reach operating surgeons & will lead to a delay in care).   Epic staff messaging available for outptient concerns needing 1-2 business day response.      05/12/2023

## 2023-05-12 NOTE — H&P (Signed)
 H&P Physician requesting consult: Karie Soda  Chief Complaint: Diverticulitis  History of Present Illness: 63 year old male with recurrent diverticulitis presents for robotic assisted sigmoid colectomy.  Cystoscopy with ureteral instillation of firefly requested.  Past Medical History:  Diagnosis Date   Arthritis    Colon polyp    Diverticulosis    Pre-diabetes    Refusal of blood transfusions as patient is Jehovah's Witness    Past Surgical History:  Procedure Laterality Date   COLONOSCOPY     IR RADIOLOGIST EVAL & MGMT  03/30/2023   KNEE ARTHROSCOPY WITH MEDIAL MENISECTOMY Right 02/05/2020   Procedure: Right knee arthroscopy with partial medial and lateral menisectomy, chondroplasty, partial synovectomy, removal loose body;  Surgeon: Lyndle Herrlich, MD;  Location: [facility] ORS;  Service: Orthopedics;  Laterality: Right;   MENISCUS REPAIR Left 01/2014   TOOTH EXTRACTION     UPPER GASTROINTESTINAL ENDOSCOPY  05/29/2021    Home Medications:  Medications Prior to Admission  Medication Sig Dispense Refill Last Dose/Taking   acetaminophen (TYLENOL) 325 MG tablet Take 2 tablets (650 mg total) by mouth every 6 (six) hours as needed for mild pain, fever or headache.   Past Week   atorvastatin (LIPITOR) 10 MG tablet Take 1 tablet (10 mg total) by mouth daily. (Patient not taking: Reported on 03/16/2023) 90 tablet 3 Taking   Ensure (ENSURE) Take 237 mLs by mouth daily as needed (for supplementation- or, Boost). Or boost   Taking As Needed   glycopyrrolate (ROBINUL) 1 MG tablet Take 1 tablet (1 mg total) by mouth 2 (two) times daily. 60 tablet 11 Past Week   lactose free nutrition (BOOST) LIQD Take 237 mLs by mouth daily as needed (for supplementation- or, Ensure).   Past Week   Magnesium 250 MG TABS Take 250 mg by mouth in the morning.   Past Week   omeprazole (PRILOSEC) 40 MG capsule TAKE 1 CAPSULE (40 MG TOTAL) BY MOUTH DAILY. (Patient taking differently: Take 40 mg by mouth daily before  breakfast.) 30 capsule 8 Past Week   traMADol (ULTRAM) 50 MG tablet Take 1-2 tablets (50-100 mg total) by mouth every 8 (eight) hours as needed for severe pain (pain score 7-10) or moderate pain (pain score 4-6) (for pain). 20 tablet 0 Past Week   albuterol (VENTOLIN HFA) 108 (90 Base) MCG/ACT inhaler TAKE 2 PUFFS BY MOUTH EVERY 6 HOURS AS NEEDED FOR WHEEZE OR SHORTNESS OF BREATH (Patient taking differently: Inhale 2 puffs into the lungs every 6 (six) hours as needed for shortness of breath or wheezing.) 18 each 0 Unknown   sodium chloride flush 0.9 % SOLN injection Use to flush abdominal drain with 5 mL sterile saline once daily 300 mL 3    Allergies: No Known Allergies  Family History  Problem Relation Age of Onset   Breast cancer Mother    Colon cancer Mother 29   Colon polyps Mother    Lymphoma Mother    Heart disease Father    Heart failure Father    Heart attack Father 68   Hypertension Sister    Hyperlipidemia Sister    Asthma Sister    Aneurysm Sister    Hypertension Brother    Hyperlipidemia Brother    Deep vein thrombosis Brother    Heart attack Brother        50-60   Hypertension Maternal Grandmother    Diabetes Maternal Grandmother    Hypertension Maternal Aunt    Cancer Maternal Aunt  type unknown   Hypertension Maternal Uncle    Liver cancer Maternal Uncle    Hypertension Paternal Aunt    Hypertension Paternal Uncle    Esophageal cancer Neg Hx    Rectal cancer Neg Hx    Stomach cancer Neg Hx    Social History:  reports that he quit smoking about 4 years ago. His smoking use included cigarettes. He has never used smokeless tobacco. He reports that he does not currently use alcohol after a past usage of about 1.0 standard drink of alcohol per week. He reports that he does not use drugs.  ROS: A complete review of systems was performed.  All systems are negative except for pertinent findings as noted. ROS   Physical Exam:  Vital signs in last 24  hours: Temp:  [98.7 F (37.1 C)] 98.7 F (37.1 C) (02/26 1035) Pulse Rate:  [67] 67 (02/26 1035) Resp:  [16] 16 (02/26 1035) BP: (128)/(86) 128/86 (02/26 1035) SpO2:  [98 %] 98 % (02/26 1035) Weight:  [81.6 kg] 81.6 kg (02/26 1035) General:  Alert and oriented, No acute distress HEENT: Normocephalic, atraumatic Neck: No JVD or lymphadenopathy Cardiovascular: Regular rate and rhythm Lungs: Regular rate and effort Abdomen: Soft, nontender, nondistended, no abdominal masses Back: No CVA tenderness Extremities: No edema Neurologic: Grossly intact  Laboratory Data:  No results found for this or any previous visit (from the past 24 hours). No results found for this or any previous visit (from the past 240 hours). Creatinine: No results for input(s): "CREATININE" in the last 168 hours.  Impression/Assessment:  Diverticulitis  Plan:  Proceed with cystoscopy with bilateral ureteral instillation of firefly.  Consent obtained.  Risk and benefits discussed.  Ray Church, III 05/12/2023, 12:22 PM

## 2023-05-12 NOTE — Anesthesia Procedure Notes (Signed)
 Procedure Name: Intubation Date/Time: 05/12/2023 1:17 PM  Performed by: Elisabeth Cara, CRNAPre-anesthesia Checklist: Patient identified, Emergency Drugs available, Suction available and Patient being monitored Patient Re-evaluated:Patient Re-evaluated prior to induction Oxygen Delivery Method: Circle system utilized Preoxygenation: Pre-oxygenation with 100% oxygen Induction Type: IV induction Ventilation: Mask ventilation without difficulty Laryngoscope Size: Mac and 4 Grade View: Grade I Tube type: Oral Tube size: 7.5 mm Number of attempts: 1 Airway Equipment and Method: Stylet and Oral airway Placement Confirmation: ETT inserted through vocal cords under direct vision, positive ETCO2 and breath sounds checked- equal and bilateral Secured at: 22 cm Tube secured with: Tape Dental Injury: Teeth and Oropharynx as per pre-operative assessment

## 2023-05-12 NOTE — Anesthesia Preprocedure Evaluation (Addendum)
 Anesthesia Evaluation  Patient identified by MRN, date of birth, ID band Patient awake    Reviewed: Allergy & Precautions, NPO status , Patient's Chart, lab work & pertinent test results  History of Anesthesia Complications Negative for: history of anesthetic complications  Airway Mallampati: II  TM Distance: >3 FB Neck ROM: Full    Dental  (+) Dental Advisory Given, Teeth Intact   Pulmonary former smoker   Pulmonary exam normal        Cardiovascular negative cardio ROS Normal cardiovascular exam     Neuro/Psych negative neurological ROS  negative psych ROS   GI/Hepatic Neg liver ROS,GERD  Medicated and Controlled,,  Endo/Other  negative endocrine ROS    Renal/GU negative Renal ROS     Musculoskeletal  (+) Arthritis , Osteoarthritis,    Abdominal   Peds  Hematology  (+) REFUSES BLOOD PRODUCTS, JEHOVAH'S WITNESS  Anesthesia Other Findings   Reproductive/Obstetrics                              Anesthesia Physical Anesthesia Plan  ASA: 2  Anesthesia Plan: General   Post-op Pain Management: Tylenol PO (pre-op)* and Ketamine IV*   Induction: Intravenous  PONV Risk Score and Plan: 2 and Treatment may vary due to age or medical condition, Ondansetron, Dexamethasone, Midazolam and Scopolamine patch - Pre-op  Airway Management Planned: Oral ETT  Additional Equipment: None  Intra-op Plan:   Post-operative Plan: Extubation in OR  Informed Consent: I have reviewed the patients History and Physical, chart, labs and discussed the procedure including the risks, benefits and alternatives for the proposed anesthesia with the patient or authorized representative who has indicated his/her understanding and acceptance.     Dental advisory given  Plan Discussed with: CRNA and Anesthesiologist  Anesthesia Plan Comments:         Anesthesia Quick Evaluation

## 2023-05-12 NOTE — Discharge Instructions (Signed)
 SURGERY: POST OP INSTRUCTIONS (Surgery for small bowel obstruction, colon resection, etc)   ######################################################################  EAT Gradually transition to a high fiber diet with a fiber supplement over the next few days after discharge  WALK Walk an hour a day.  Control your pain to do that.    CONTROL PAIN Control pain so that you can walk, sleep, tolerate sneezing/coughing, go up/down stairs.  HAVE A BOWEL MOVEMENT DAILY Keep your bowels regular to avoid problems.  OK to try a laxative to override constipation.  OK to use an antidairrheal to slow down diarrhea.  Call if not better after 2 tries  CALL IF YOU HAVE PROBLEMS/CONCERNS Call if you are still struggling despite following these instructions. Call if you have concerns not answered by these instructions  ######################################################################   DIET Follow a light diet the first few days at home.  Start with a bland diet such as soups, liquids, starchy foods, low fat foods, etc.  If you feel full, bloated, or constipated, stay on a ful liquid or pureed/blenderized diet for a few days until you feel better and no longer constipated. Be sure to drink plenty of fluids every day to avoid getting dehydrated (feeling dizzy, not urinating, etc.). Gradually add a fiber supplement to your diet over the next week.  Gradually get back to a regular solid diet.  Avoid fast food or heavy meals the first week as you are more likely to get nauseated. It is expected for your digestive tract to need a few months to get back to normal.  It is common for your bowel movements and stools to be irregular.  You will have occasional bloating and cramping that should eventually fade away.  Until you are eating solid food normally, off all pain medications, and back to regular activities; your bowels will not be normal. Focus on eating a low-fat, high fiber diet the rest of your life  (See Getting to Good Bowel Health, below).  CARE of your INCISION or WOUND  It is good for closed incisions and even open wounds to be washed every day.  Shower every day.  Short baths are fine.  Wash the incisions and wounds clean with soap & water.    You may leave closed incisions open to air if it is dry.   You may cover the incision with clean gauze & replace it after your daily shower for comfort.   If you have an open wound with a wound vac, see wound vac care instructions.    ACTIVITIES as tolerated Start light daily activities --- self-care, walking, climbing stairs-- beginning the day after surgery.  Gradually increase activities as tolerated.  Control your pain to be active.  Stop when you are tired.  Ideally, walk several times a day, eventually an hour a day.   Most people are back to most day-to-day activities in a few weeks.  It takes 4-8 weeks to get back to unrestricted, intense activity. If you can walk 30 minutes without difficulty, it is safe to try more intense activity such as jogging, treadmill, bicycling, low-impact aerobics, swimming, etc. Save the most intensive and strenuous activity for last (Usually 4-8 weeks after surgery) such as sit-ups, heavy lifting, contact sports, etc.  Refrain from any intense heavy lifting or straining until you are off narcotics for pain control.  You will have off days, but things should improve week-by-week. DO NOT PUSH THROUGH PAIN.  Let pain be your guide: If it hurts to do something, don't  do it.  Pain is your body warning you to avoid that activity for another week until the pain goes down. You may drive when you are no longer taking narcotic prescription pain medication, you can comfortably wear a seatbelt, and you can safely make sudden turns/stops to protect yourself without hesitating due to pain. You may have sexual intercourse when it is comfortable. If it hurts to do something, stop.   MEDICATIONS Take your usually prescribed  home medications unless otherwise directed.    Blood thinners:  You can restart any strong blood thinners after the second postoperative day  for example: COUMADIN (warfarin), XERELTO (rivaroxaban), ELIQUIS (apixaban), PLAVIX (clopidigrel), BRILINTA (ticagrelor), EFFIENT (prasugrel), PRADAXA (dabigatran), etc  Continue aspirin before & after surgery..     Some oozing/bleeding the first 1-2 weeks is common but should taper down & be small volume.    If you are passing many large clots or having uncontrolling bleeding, call your surgeon    PAIN CONTROL Pain after surgery or related to activity is often due to strain/injury to muscle, tendon, nerves and/or incisions.  This pain is usually short-term and will improve in a few months.  To help speed the process of healing and to get back to regular activity more quickly, DO THE FOLLOWING THINGS TOGETHER: Increase activity gradually.  DO NOT PUSH THROUGH PAIN Use Ice and/or Heat Try Gentle Massage and/or Stretching Take over the counter pain medication Take Narcotic prescription pain medication for more severe pain  Good pain control = faster recovery.  It is better to take more medicine to be more active than to stay in bed all day to avoid medications.  Increase activity gradually Avoid heavy lifting at first, then increase to lifting as tolerated over the next 6 weeks. Do not "push through" the pain.  Listen to your body and avoid positions and maneuvers than reproduce the pain.  Wait a few days before trying something more intense Walking an hour a day is encouraged to help your body recover faster and more safely.  Start slowly and stop when getting sore.  If you can walk 30 minutes without stopping or pain, you can try more intense activity (running, jogging, aerobics, cycling, swimming, treadmill, sex, sports, weightlifting, etc.) Remember: If it hurts to do it, then don't do it! Use Ice and/or Heat You will have swelling and bruising  around the incisions.  This will take several weeks to resolve. Ice packs or heating pads (6-8 times a day, 30-60 minutes at a time) will help sooth soreness & bruising. Some people prefer to use ice alone, heat alone, or alternate between ice & heat.  Experiment and see what works best for you.  Consider trying ice for the first few days to help decrease swelling and bruising; then, switch to heat to help relax sore spots and speed recovery. Shower every day.  Short baths are fine.  It feels good!  Keep the incisions and wounds clean with soap & water.   Try Gentle Massage and/or Stretching Massage at the area of pain many times a day Stop if you feel pain - do not overdo it Take over the counter pain medication This helps the muscle and nerve tissues become less irritable and calm down faster Choose ONE of the following over-the-counter anti-inflammatory medications: Acetaminophen 500mg  tabs (Tylenol) 1-2 pills with every meal and just before bedtime (avoid if you have liver problems or if you have acetaminophen in you narcotic prescription) Naproxen 220mg  tabs (ex. Aleve,  Naprosyn) 1-2 pills twice a day (avoid if you have kidney, stomach, IBD, or bleeding problems) Ibuprofen 200mg  tabs (ex. Advil, Motrin) 3-4 pills with every meal and just before bedtime (avoid if you have kidney, stomach, IBD, or bleeding problems) Take with food/snack several times a day as directed for at least 2 weeks to help keep pain / soreness down & more manageable. Take Narcotic prescription pain medication for more severe pain A prescription for strong pain control is often given to you upon discharge (for example: oxycodone/Percocet, hydrocodone/Norco/Vicodin, or tramadol/Ultram) Take your pain medication as prescribed. Be mindful that most narcotic prescriptions contain Tylenol (acetaminophen) as well - avoid taking too much Tylenol. If you are having problems/concerns with the prescription medicine (does not control  pain, nausea, vomiting, rash, itching, etc.), please call us [phone] to see if we need to switch you to a different pain medicine that will work better for you and/or control your side effects better. If you need a refill on your pain medication, you must call the office before 4 pm and on weekdays only.  By federal law, prescriptions for narcotics cannot be called into a pharmacy.  They must be filled out on paper & picked up from our office by the patient or authorized caretaker.  Prescriptions cannot be filled after 4 pm nor on weekends.    WHEN TO CALL us [phone] Severe uncontrolled or worsening pain  Fever over 101 F (38.5 C) Concerns with the incision: Worsening pain, redness, rash/hives, swelling, bleeding, or drainage Reactions / problems with new medications (itching, rash, hives, nausea, etc.) Nausea and/or vomiting Difficulty urinating Difficulty breathing Worsening fatigue, dizziness, lightheadedness, blurred vision Other concerns If you are not getting better after two weeks or are noticing you are getting worse, contact our office [phone]) [phone] for further advice.  We may need to adjust your medications, re-evaluate you in the office, send you to the emergency room, or see what other things we can do to help. The clinic staff is available to answer your questions during regular business hours (8:30am-5pm).  Please don't hesitate to call and ask to speak to one of our nurses for clinical concerns.    A surgeon from [facility] is always on call at the hospitals 24 hours/day If you have a medical emergency, go to the nearest emergency room or call 911.  FOLLOW UP in our office One the day of your discharge from the hospital (or the next business weekday), please call [facility] to set up or confirm an appointment to see your surgeon in the office for a follow-up appointment.  Usually it is 2-3 weeks after your surgery.   If you have skin  staples at your incision(s), let the office know so we can set up a time in the office for the nurse to remove them (usually around 10 days after surgery). Make sure that you call for appointments the day of discharge (or the next business weekday) from the hospital to ensure a convenient appointment time. IF YOU HAVE DISABILITY OR FAMILY LEAVE FORMS, BRING THEM TO THE OFFICE FOR PROCESSING.  DO NOT GIVE THEM TO YOUR DOCTOR.  [facility] [address], [place], [place]  [address] ? [phone] - Main [phone] - Toll Free,  [phone] - Fax [email or web address]    GETTING TO GOOD BOWEL HEALTH. It is expected for your digestive tract to need a few months to get back to normal.  It is common for your bowel movements and stools to be irregular.  You will have occasional bloating and cramping that should eventually fade away.  Until you are eating solid food normally, off all pain medications, and back to regular activities; your bowels will not be normal.   Avoiding constipation The goal: ONE SOFT BOWEL MOVEMENT A DAY!    Drink plenty of fluids.  Choose water first. TAKE A FIBER SUPPLEMENT EVERY DAY THE REST OF YOUR LIFE During your first week back home, gradually add back a fiber supplement every day Experiment which form you can tolerate.   There are many forms such as powders, tablets, wafers, gummies, etc Psyllium bran (Metamucil), methylcellulose (Citrucel), Miralax or Glycolax, Benefiber, Flax Seed.  Adjust the dose week-by-week (1/2 dose/day to 6 doses a day) until you are moving your bowels 1-2 times a day.  Cut back the dose or try a different fiber product if it is giving you problems such as diarrhea or bloating. Sometimes a laxative is needed to help jump-start bowels if constipated until the fiber supplement can help regulate your bowels.  If you are tolerating eating & you are farting, it is okay to try a gentle laxative such as  double dose MiraLax, prune juice, or Milk of Magnesia.  Avoid using laxatives too often. Stool softeners can sometimes help counteract the constipating effects of narcotic pain medicines.  It can also cause diarrhea, so avoid using for too long. If you are still constipated despite taking fiber daily, eating solids, and a few doses of laxatives, call our office. Controlling diarrhea Try drinking liquids and eating bland foods for a few days to avoid stressing your intestines further. Avoid dairy products (especially milk & ice cream) for a short time.  The intestines often can lose the ability to digest lactose when stressed. Avoid foods that cause gassiness or bloating.  Typical foods include beans and other legumes, cabbage, broccoli, and dairy foods.  Avoid greasy, spicy, fast foods.  Every person has some sensitivity to other foods, so listen to your body and avoid those foods that trigger problems for you. Probiotics (such as active yogurt, Align, etc) may help repopulate the intestines and colon with normal bacteria and calm down a sensitive digestive tract Adding a fiber supplement gradually can help thicken stools by absorbing excess fluid and retrain the intestines to act more normally.  Slowly increase the dose over a few weeks.  Too much fiber too soon can backfire and cause cramping & bloating. It is okay to try and slow down diarrhea with a few doses of antidiarrheal medicines.   Bismuth subsalicylate (ex. Kayopectate, Pepto Bismol) for a few doses can help control diarrhea.  Avoid if pregnant.   Loperamide (Imodium) can slow down diarrhea.  Start with one tablet (2mg ) first.  Avoid if you are having fevers or severe pain.  ILEOSTOMY PATIENTS WILL HAVE CHRONIC DIARRHEA since their colon is not in use.    Drink plenty of liquids.  You will need to drink even more glasses of water/liquid a day to avoid getting dehydrated. Record output from your ileostomy.  Expect to empty the bag every 3-4  hours at first.  Most people with a permanent ileostomy empty their bag 4-6 times at the least.   Use antidiarrheal medicine (especially Imodium) several times a day to avoid getting dehydrated.  Start with a dose at bedtime & breakfast.  Adjust up or down as needed.  Increase antidiarrheal medications as directed to  avoid emptying the bag more than 8 times a day (every 3 hours). Work with your wound ostomy nurse to learn care for your ostomy.  See ostomy care instructions. TROUBLESHOOTING IRREGULAR BOWELS 1) Start with a soft & bland diet. No spicy, greasy, or fried foods.  2) Avoid gluten/wheat or dairy products from diet to see if symptoms improve. 3) Miralax 17gm or flax seed mixed in 8oz. water or juice-daily. May use 2-4 times a day as needed. 4) Gas-X, Phazyme, etc. as needed for gas & bloating.  5) Prilosec (omeprazole) over-the-counter as needed 6)  Consider probiotics (Align, Activa, etc) to help calm the bowels down  Call your doctor if you are getting worse or not getting better.  Sometimes further testing (cultures, endoscopy, X-ray studies, CT scans, bloodwork, etc.) may be needed to help diagnose and treat the cause of the diarrhea. [facility] [address], [address], [place], [place]  [address] [phone] - Main.    [phone]  - Toll Free.   [phone] - Fax [email or web address]

## 2023-05-12 NOTE — Op Note (Signed)
 05/12/2023  4:02 PM  PATIENT:  Marcus Andrews  63 y.o. 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  This assistant was needed for exposure, dissection, suction, tissue approximation, retraction, perception, etc  ANESTHESIA:  General endotracheal intubation anesthesia (GETA) and Regional TRANSVERSUS ABDOMINIS PLANE (TAP) nerve block -BILATERAL for perioperative & postoperative pain control at the level of the transverse abdominis & preperitoneal spaces along the flank at the anterior axillary line, from subcostal ridge to iliac crest under laparoscopic guidance provided with liposomal bupivacaine (Experel) 20mL mixed with 50 mL of bupivicaine 0.25% with epinephrine  Estimated Blood Loss (EBL):   Total I/O In: 1100 [I.V.:1000; IV Piggyback:100] Out: 400 [Urine:350; Blood:50].   (See anesthesia record)  Delay start of Pharmacological VTE agent (>24hrs) due to concerns of significant anemia, surgical blood loss, or risk of bleeding?:  no  DRAINS: (None)  SPECIMEN:  Rectosigmoid (open end proximal) and Distal anastomotic ring (FINAL DISTAL MARGIN)  DISPOSITION OF SPECIMEN:   Pathology  COUNTS:  Sponge, needle, & instrument counts CORRECT  PLAN OF CARE: Admit to inpatient   PATIENT DISPOSITION:  PACU - hemodynamically stable.  INDICATION:    Active patient with current episode of diverticulitis with evidence of stricture and development of abscess.  Stabilized antibiotics.  No evidence of malignancy.  I recommended segmental resection:  The anatomy & physiology of the digestive tract was discussed.  The pathophysiology was discussed.  Natural history risks without surgery was discussed.   I worked to give an overview of the disease and the frequent need to have multispecialty involvement.  I feel the risks of no intervention will lead to serious problems that outweigh the operative risks; therefore, I recommended a partial colectomy to remove the pathology.  Laparoscopic & open techniques were discussed.   Risks such as bleeding, infection, abscess, leak, reoperation, possible ostomy, hernia, heart attack, death, and other risks were discussed.  I noted a good likelihood this will help address the problem.   Goals of post-operative recovery were discussed as well.  We will work to minimize complications.  Educational materials on the pathology had been given in the office.  Questions were answered.    The patient expressed understanding & wished to proceed with surgery.  OR FINDINGS:   Patient had very inflamed sigmoid colon with concrete adhesions and abscess in left anterior lateral pelvis running over left ureter and iliacs  No obvious metastatic disease on visceral parietal peritoneum or liver.  It is a 31mm EEA anastomosis ( distal descending colon connected to proximal rectum.)  It rests 12 cm from the anal verge by flexible sigmoidoscopy  CASE DATA: Type of patient?: Elective [facility] Private Case Status of Case? Elective Scheduled Infection Present At Time Of Surgery (PATOS)?  ABSCESS   DESCRIPTION:   Informed consent was confirmed.  The patient  underwent general anaesthesia without difficulty.  The patient was positioned appropriately.  VTE prevention in place.  The patient was clipped, prepped, & draped in a sterile fashion.  Patient underwent cystoscopy with firefly insufflation of bladder and ureters by Dr. Alvester Morin with [facility].  Please see his separate report.  Surgical timeout confirmed our plan.  The patient was positioned in reverse Trendelenburg.  Abdominal entry was gained using Varess technique at the left subcostal ridge on the anterior abdominal wall.  No elevated EtCO2 noted.  Port placed.  Camera inspection revealed no injury.  Extra ports were carefully placed under direct laparoscopic visualization.  Upon entering the abdomen (organ space),we encountered a phlegmon involving the sigmoid colon .   I reflected the greater omentum and the upper abdomen the small bowel in the upper abdomen.  The patient was carefully positioned.  The Intuitive daVinci robot was docked with camera & instruments carefully placed.  The rectosigmoid colon was concrete and thickened and densely adhered with foreshortened mesentery.  However was able to elevate the left colon mesentery to put the main pedicle on tension.  I scored the base of peritoneum of the medial side of the mesentery of the elevated left colon from the ligament of Treitz to the mid rectum.   I elevated the sigmoid mesentery and entered into the retro-mesenteric plane. We were able to identify the left ureter and gonadal vessels. We kept those posterior within the retroperitoneum and elevated the left colon mesentery off that.  It was apparent that the left colon mesentery was densely adherent to the left lower retroperitoneum and pelvic reflection.  Toupet more proximally and was able to isolate the inferior mesenteric artery (IMA) and inferior mesenteric vein pedicles.  I skeletonized the lymph nodes off the inferior mesenteric artery pedicle.  I went down to its takeoff from the  aorta.  I isolated the inferior mesenteric vein off of the ligament of Treitz just cephalad to that as well.  After confirming the left ureter was out of the way, I went ahead and ligated the inferior mesenteric artery pedicle just near its takeoff from the aorta.  I did ligate the inferior mesenteric vein in a similar fashion.  We ensured hemostasis.  I continued medial to lateral dissection to free the left colon mesentery off the retroperitoneum going up towards the splenic flexure to allow good mobility and protect the colon mesentery.  I was able to get the distal transverse colon and splenic flexure off the retroperitoneum to get much better mobility since the sigmoid mesentery was rather shrunken and stiff.  That allowed much better mobility.  I mobilized the left colon in a lateral to medial fashion off the retroperitoneum and sidewall attachments along the line of Toldt up towards the splenic flexure to ensure good mobilization of the remaining left colon to reach into the pelvis.   We then focused on mesorectal dissection.  I switched to robotic scissors and used sharp and focused cautery dissection to peel off the rectosigmoid colon off the left ureter and pelvis carefully.  We encountered an abscess cavity and aspirated that.  That provided some decompression.  I was able to free the visceral peritoneal coverings on the mid rectum on the left lateral side and came up more proximally.  This allowed me to find a window to free the remaining rectosigmoid colon off the left pelvis and retroperitoneum safely.  With this we could connect to the mesorectal dissection we had done.  We freed  the mesorectum off the presacral plane until I was distal to the concerning region.  Freed off peritoneum on the lateral sidewalls as well and transected the mesentery of the lateral pedicles to get distal to the area of concern.  Came around anteriorly such that I had good circumferential mesorectal excision and a good  margin distal to the area of concern.  I chose a region at the descending/sigmoid junction that was soft and easily reached down to the rectal stump.  Transected the mesentery radially to this junction.  I skeletonized and transected through the the mesorectum at the  proximal rectum a few centimeters more distally beyond the abscess cavity and inflammation.  To assess vascular perfusion of tissues, we asked anesthesia use intravenous  indocyanine green (ICG) with IV flush.  I switched to the NIR fluorescence (Firefly mode) imaging window on the daVinci robot platform.  We were able to see good light green visualization of blood vessels with good vascular perfusion of tissues, confirming good tissue perfusion of tissues (distal descending colon and rectum) planned for anastomosis.  Then transected at the distal margin with a robotic stapler -60 mm green load x 1 firing to good result..  We created an extraction incision through a small Pfannenstiel incision in the suprapubic region.  Placed a wound protector.  I was able to eviscerate the rectosigmoid and descending colon out the wound.   I clamped the colon proximal to this area using a reusable pursestringer device.  Passed a 2-0 Keith needle. I transected at the descending/sigmoid junction with a scalpel. I got healthy bleeding mucosa.  We sent the rectosigmoid colon specimen off to go to pathology.  We sized the colon orifice.  I chose a 31mm EEA anvil stapler system.  I reinforced the prolene pursestring with interrupted silk "belt loop" sutures.  I placed the anvil to the open end of the proximal remaining colon and closed around it using the pursestring.    We did copious irrigation with crystalloid solution.  Hemostasis was good.  The distal end of the remaining colon easily reached down to the rectal stump, therefore, the partial splenic flexure mobilization was adequate and we do not need to complete full mobilization.      Dr Cliffton Asters scrubbed down and  did gentle anal dilation and advanced the EEA stapler up the rectal stump. The spike was brought out at the provimal end of the rectal stump under direct visualization.  I attached the anvil of the proximal colon the spike of the stapler. Anvil was tightened down and held clamped for 60 seconds.  Orientation was confirmed such that there is no twisting of the colon nor small bowel underneath the mesenteric defect. No concerning tension.  Did serial irrigations of several liters to help clear out the pelvis and aspirated the abscess cavity that had run along the left ureter and iliacs.  Confirmed no injury and removed any remaining necrotic and ischemic tissue to good result.  No remaining purulence in the area cleared up well.  The EEA stapler was fired and held clamped for 30 seconds. The stapler was released & removed. Blue stitch is in the proximal ring.  Care was taken to ensure no other structures were incorporated within this either.  We noted 2 excellent anastomotic rings.   The colon proximal to the anastomosis was then gently occluded. The pelvis was filled with sterile irrigation.  Dr Cliffton Asters did flexible sigmoidoscopy.  Noted the anastomosis was at 12 cm from the  anal verge consistent with the proximal rectum.  Intact anastomosis without active bleeding.  No mucosal ischemia.  There was a negative air leak test. There was no tension of mesentery or bowel at the anastomosis.   Tissues looked viable.  Ureters & bowel uninjured.  The anastomosis looked healthy.  Greater omentum positioned down into the pelvis to help protect the anastomosis.  Endoluminal gas was evacuated.  Ports & wound protector removed.  We changed gloves & redraped the patient per colon SSI prevention protocol.  We aspirated the sterile irrigation.  Hemostasis was good.  Sterile unused instruments were used from this point.  I closed the skin at the port sites using Monocryl stitch and sterile dressing.  We assured hemostasis and the  former ostomy wound.  Wound irrigated.  I closed the posterior rectus fascia with 0 Vicryl suture.  Anterior rectus fascia was closed using #1 PDS transversely.  Sterile dressing placed.   Patient is being extubated go to recovery room. I had discussed postop care with the patient in detail the office & in the holding area. Instructions are written. I discussed operative findings, updated the patient's status, discussed probable steps to recovery, and gave postoperative recommendations to the patient's brother, Finneas Mathe .  Recommendations were made.  Questions were answered.  He expressed understanding & appreciation.  Ardeth Sportsman, M.D., F.A.C.S. Gastrointestinal and Minimally Invasive Surgery [facility] [address] [place], [place] [address] [phone] Main / Paging

## 2023-05-13 ENCOUNTER — Other Ambulatory Visit: Payer: Self-pay

## 2023-05-13 ENCOUNTER — Encounter (HOSPITAL_COMMUNITY): Payer: Self-pay | Admitting: Surgery

## 2023-05-13 LAB — MAGNESIUM: Magnesium: 1.9 mg/dL (ref 1.7–2.4)

## 2023-05-13 LAB — CBC
HCT: 40.1 % (ref 39.0–52.0)
Hemoglobin: 12.9 g/dL — ABNORMAL LOW (ref 13.0–17.0)
MCH: 27.6 pg (ref 26.0–34.0)
MCHC: 32.2 g/dL (ref 30.0–36.0)
MCV: 85.9 fL (ref 80.0–100.0)
Platelets: 238 10*3/uL (ref 150–400)
RBC: 4.67 MIL/uL (ref 4.22–5.81)
RDW: 15.4 % (ref 11.5–15.5)
WBC: 9.7 10*3/uL (ref 4.0–10.5)
nRBC: 0 % (ref 0.0–0.2)

## 2023-05-13 LAB — BASIC METABOLIC PANEL
Anion gap: 8 (ref 5–15)
BUN: 11 mg/dL (ref 8–23)
CO2: 25 mmol/L (ref 22–32)
Calcium: 8.1 mg/dL — ABNORMAL LOW (ref 8.9–10.3)
Chloride: 99 mmol/L (ref 98–111)
Creatinine, Ser: 0.85 mg/dL (ref 0.61–1.24)
GFR, Estimated: >60 mL/min (ref >60)
Glucose, Bld: 121 mg/dL — ABNORMAL HIGH (ref 70–99)
Potassium: 3.9 mmol/L (ref 3.5–5.1)
Sodium: 132 mmol/L — ABNORMAL LOW (ref 135–145)

## 2023-05-13 NOTE — Plan of Care (Signed)

## 2023-05-13 NOTE — Progress Notes (Signed)
 05/13/2023  Marcus Andrews [id number] [birth year]  CARE TEAM: PCP: Mort Sawyers, FNP  Outpatient Care Team: Patient Care Team: Mort Sawyers, FNP as PCP - General (Family Medicine) Meryl Dare, MD (Inactive) as Consulting Physician (Gastroenterology) Karie Soda, MD as Consulting Physician (General Surgery)  Inpatient Treatment Team: Treatment Team:  Karie Soda, MD Misty Stanley, NT [place], Deirdre Evener, [facility] Tobin Chad, RN Delfina Redwood, RN Anselm Pancoast, [place]   Problem List:   Principal Problem:   Stricture of sigmoid colon [facility])   05/12/2023  POST-OPERATIVE DIAGNOSIS:  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   It is a 31mm EEA anastomosis ( distal descending colon connected to proximal rectum.)  It rests 12 cm from the anal verge by flexible sigmoidoscopy   CASE DATA: Type of patient?: Elective [facility] Private Case Status of Case? Elective Scheduled Infection Present At Time Of Surgery (PATOS)?  ABSCESS    Assessment [facility] Stay = 1 days) 1 Day Post-Op    Recovering relatively well so far    Plan:  ERAS protocol Follow-up in pathology [place] IV fluids Advance diet. Nausea and pain control. VTE prophylaxis- SCDs, enoxaparin mobilize as tolerated to help recovery -the fact he is already gone up is a good start. -Disposition:  Disposition:  The  patient is from: Home Anticipate discharge to:  Home Anticipated Date of Discharge is:  August 28,2025   Barriers to discharge:  Consultant clearance & sign off    Patient currently is NOT MEDICALLY STABLE for discharge from the hospital from a surgery standpoint.      I reviewed nursing notes, last 24 h vitals and pain scores, last 48 h intake and output, last 24 h labs and trends, and last 24 h imaging results.  I have reviewed this patient's available data, including medical history, events of note, test results, etc as part of my evaluation.   A significant portion of that time was spent in counseling. Care during the described time interval was provided by me.  This care required moderate level of medical decision making.  05/13/2023    Subjective: (Chief complaint)  No major events.   Walking in hallways.   A little nauseated yesterday but feeling better and thirsty. Nursing in room.  Objective:  Vital signs:  Vitals:   05/12/23 2140 05/12/23 2243 05/12/23 2344 05/13/23 0553  BP: (!) 155/97 (!) 145/95 (!) 142/86 127/88  Pulse: 68 66 80 70  Resp: 18 18 18 18   Temp: 99 F (37.2 C) 98.9 F (37.2 C) 98.7 F (37.1 C) 98 F (36.7 C)  TempSrc: Oral Oral Oral Oral  SpO2: 94% 95% 95% 91%  Weight:      Height:        Last BM Date : 05/11/23  Intake/Output   Yesterday:  02/26 0701 - 02/27 0700 In: 1427.1 [I.V.:1327.1; IV Piggyback:100] Out: 1300 [Urine:1250; Blood:50] This shift:  No intake/output data recorded.  Bowel function:  Flatus: YES  BM:  No  Drain: (No drain)   Physical Exam:  General: Pt awake/alert in no acute distress Eyes: PERRL, normal EOM.  Sclera clear.  No icterus Neuro: CN II-XII intact w/o focal sensory/motor deficits. Lymph: No head/neck/groin lymphadenopathy Psych:  No delerium/psychosis/paranoia.  Oriented x 4 HENT: Normocephalic, Mucus membranes moist.  No thrush Neck: Supple, No tracheal deviation.  No obvious  thyromegaly Chest: No pain to chest wall compression.  Good respiratory excursion.  No audible wheezing CV:  Pulses intact.  Regular rhythm.  No major extremity edema MS: Normal AROM mjr joints.  No obvious deformity  Abdomen: Soft.  Nondistended.  Mildly tender at incisions only.  No evidence of peritonitis.  No incarcerated hernias.  Ext:   No deformity.  No mjr edema.  No cyanosis Skin: No petechiae / purpurea.  No major sores.  Warm and dry    Results:   Cultures: No results found for this or any previous visit (from the past 720 hours).  Labs: Results for orders placed or performed during the hospital encounter of 05/12/23 (from the past 48 hours)  Basic metabolic panel     Status: Abnormal   Collection Time: 05/13/23  5:10 AM  Result Value Ref Range   Sodium 132 (L) 135 - 145 mmol/L   Potassium 3.9 3.5 - 5.1 mmol/L   Chloride 99 98 - 111 mmol/L   CO2 25 22 - 32 mmol/L   Glucose, Bld 121 (H) 70 - 99 mg/dL    Comment: Glucose reference range applies only to samples taken after fasting for at least 8 hours.   BUN 11 8 - 23 mg/dL   Creatinine, Ser 1.47 0.61 - 1.24 mg/dL   Calcium 8.1 (L) 8.9 - 10.3 mg/dL   GFR, Estimated >82 >95 mL/min    Comment: (NOTE) Calculated using the CKD-EPI Creatinine Equation (2021)    Anion gap 8 5 - 15    Comment: Performed at [facility], [address], [place], [place] [address]  CBC     Status: Abnormal   Collection Time: 05/13/23  5:10 AM  Result Value Ref Range   WBC 9.7 4.0 - 10.5 K/uL   RBC 4.67 4.22 - 5.81 MIL/uL   Hemoglobin 12.9 (L) 13.0 - 17.0 g/dL   HCT 86.5 78.4 - 69.6 %   MCV 85.9 80.0 - 100.0 fL   MCH 27.6 26.0 - 34.0 pg   MCHC 32.2 30.0 - 36.0 g/dL   RDW 29.5 28.4 - 13.2 %   Platelets 238 150 - 400 K/uL   nRBC 0.0 0.0 - 0.2 %    Comment: Performed at [facility], [address] [place], [place] [address]  Magnesium     Status: None   Collection Time: 05/13/23  5:10 AM  Result  Value Ref Range   Magnesium 1.9 1.7 - 2.4 mg/dL    Comment: Performed at [facility], [address] [place], [place] [address]    Imaging / Studies: No results found.  Medications / Allergies: per chart  Antibiotics: Anti-infectives (From admission, onward)    Start     Dose/Rate Route Frequency Ordered Stop   05/13/23 0100  cefoTEtan (CEFOTAN) 2 g in sodium chloride 0.9 % 100 mL IVPB        2 g 200 mL/hr over 30 Minutes Intravenous Every 12 hours 05/12/23 2226 05/13/23 0133   05/12/23 1400  neomycin (MYCIFRADIN) tablet 1,000 mg  Status:  Discontinued  Placed in "And" Linked Group   1,000 mg Oral 3 times per day 05/12/23 1022 05/12/23 1026   05/12/23 1400  metroNIDAZOLE (FLAGYL) tablet 1,000 mg  Status:  Discontinued       Placed in "And" Linked Group   1,000 mg Oral 3 times per day 05/12/23 1022 05/12/23 1026   05/12/23 1030  cefoTEtan (CEFOTAN) 2 g in sodium chloride 0.9 % 100 mL IVPB        2 g 200 mL/hr over 30 Minutes Intravenous On call to O.R. 05/12/23 1022 05/12/23 1418         Note: Portions of this report may have been transcribed using voice recognition software. Every effort was made to ensure accuracy; however, inadvertent computerized transcription errors may be present.   Any transcriptional errors that result from this process are unintentional.    Ardeth Sportsman, MD, FACS, MASCRS Esophageal, Gastrointestinal & Colorectal Surgery Robotic and Minimally Invasive Surgery  [facility] [address] [place], [place] [address] [phone] Fax [phone] Main  CONTACT INFORMATION: Weekday (9AM-5PM): Call [facility] main office at [phone] Weeknight (5PM-9AM) or Weekend/Holiday: Check EPIC "Web Links" tab & use "AMION" (password " [email or web address]") for General Surgery [facility] coverage  Please, DO NOT use SecureChat  (it is not reliable communication to reach operating surgeons &  will lead to a delay in care).   Epic staff messaging available for outptient concerns needing 1-2 business day response.      05/13/2023  9:32 AM

## 2023-05-13 NOTE — Progress Notes (Signed)
   05/13/23 1514  TOC Brief Assessment  Insurance and Status Reviewed  Patient has primary care physician Yes  Home environment has been reviewed home alone  Prior level of function: independent  Prior/Current Home Services No current home services  Social Drivers of Health Review SDOH reviewed no interventions necessary  Readmission risk has been reviewed Yes  Transition of care needs no transition of care needs at this time

## 2023-05-14 ENCOUNTER — Encounter: Payer: Self-pay | Admitting: Family

## 2023-05-14 LAB — SURGICAL PATHOLOGY

## 2023-05-14 MED ORDER — GABAPENTIN 100 MG PO CAPS
300.0000 mg | ORAL_CAPSULE | Freq: Three times a day (TID) | ORAL | Status: DC
  Administered 2023-05-14 – 2023-05-15 (×3): 300 mg via ORAL
  Filled 2023-05-14 (×3): qty 3

## 2023-05-14 MED ORDER — ACETAMINOPHEN 500 MG PO TABS
500.0000 mg | ORAL_TABLET | Freq: Three times a day (TID) | ORAL | Status: DC
  Administered 2023-05-14 – 2023-05-15 (×3): 500 mg via ORAL
  Filled 2023-05-14 (×3): qty 1

## 2023-05-14 MED ORDER — TRAMADOL HCL 50 MG PO TABS
50.0000 mg | ORAL_TABLET | Freq: Four times a day (QID) | ORAL | Status: DC | PRN
  Administered 2023-05-14: 50 mg via ORAL
  Administered 2023-05-15: 100 mg via ORAL
  Filled 2023-05-14: qty 2
  Filled 2023-05-14: qty 1

## 2023-05-14 MED ORDER — HYDROCODONE-ACETAMINOPHEN 10-325 MG PO TABS
0.5000 | ORAL_TABLET | Freq: Four times a day (QID) | ORAL | 0 refills | Status: DC | PRN
Start: 2023-05-14 — End: 2023-06-15

## 2023-05-14 MED ORDER — HYDROCODONE-ACETAMINOPHEN 10-325 MG PO TABS
0.5000 | ORAL_TABLET | ORAL | Status: DC | PRN
  Administered 2023-05-14: 1 via ORAL
  Filled 2023-05-14: qty 1

## 2023-05-14 MED ORDER — METHOCARBAMOL 500 MG PO TABS
1000.0000 mg | ORAL_TABLET | Freq: Four times a day (QID) | ORAL | Status: DC
  Administered 2023-05-14 – 2023-05-15 (×3): 1000 mg via ORAL
  Filled 2023-05-14 (×3): qty 2

## 2023-05-14 NOTE — Progress Notes (Signed)
 S/p colectomy for diverticulitis Path benign. I told the pt the good news  Copy of pathology report in chart for patient

## 2023-05-14 NOTE — Progress Notes (Signed)
 05/14/2023  Marcus Andrews [id number] February 15, 1961  CARE TEAM: PCP: Marcus Sawyers, FNP  Outpatient Care Team: Patient Care Team: Marcus Sawyers, FNP as PCP - General (Family Medicine) Marcus Dare, MD (Inactive) as Consulting Physician (Gastroenterology) Marcus Soda, MD as Consulting Physician (General Surgery)  Inpatient Treatment Team: Treatment Team:  Marcus Soda, MD Marcus Rich, RN Marcus Andrews, NT Marcus Andrews, [facility] Marcus Redwood, RN Marcus Andrews, Marcus Andrews   Problem List:   Principal Problem:   Stricture of sigmoid colon (HCC)   05/12/2023  POST-OPERATIVE DIAGNOSIS:  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   It is a 31mm EEA anastomosis ( distal descending colon connected to proximal rectum.)  It rests 12 cm from the anal verge by flexible sigmoidoscopy   CASE DATA: Type of patient?: Elective [facility] Private Case Status of Case? Elective Scheduled Infection Present At Time Of Surgery (PATOS)?  ABSCESS    Assessment [facility] Stay = 2 days) 2 Days Post-Op    Recovering gradually    Plan:  ERAS protocol Medlock IV fluids Advance diet gradually.  No nausea so we will try solid diet.  Does have some history of heartburn reflux. Nausea and pain control.  He feels tramadol is not working and is leaning on hydromorphone.  Will switch to hydrocodone since  oxycodone gave him worsening symptoms.  See if we can find an oral pain medication that works for him.  Increase his gabapentin.  Adjust his acetaminophen  VTE prophylaxis- SCDs, enoxaparin mobilize as tolerated to help recovery -the fact he is already gone up is a good start. -Disposition:  Disposition:  The patient is from: Home Anticipate discharge to:  Home Anticipated Date of Discharge is:  05/15/2023   Barriers to discharge:  Consultant clearance & sign off    Patient currently is NOT MEDICALLY STABLE for discharge from the hospital from a surgery standpoint.      I reviewed nursing notes, last 24 h vitals and pain scores, last 48 h intake and output, last 24 h labs and trends, and last 24 h imaging results.  I have reviewed this patient's available data, including medical history, events of note, test results, etc as part of my evaluation.   A significant portion of that time was spent in counseling. Care during the described time interval was provided by me.  This care required moderate level of medical decision making.  05/14/2023    Subjective: (Chief complaint)  No major events.   Walking in hallways.   Tolerating liquids but some burping and heartburn. Soreness on left side.  Tramadol not helping so prefers Dilaudid  Objective:  Vital signs:  Vitals:   05/13/23 1422 05/13/23 1914 05/13/23 2014 05/14/23 0632  BP: (!) 145/96 (!) 141/80 118/77 129/79  Pulse: 79 90 98 95  Resp: 17 17 18 18   Temp: 98.1 F (36.7 C) 98.8 F (37.1 C) 98.5 F (36.9 C) 98.6 F (37 C)  TempSrc: Oral  Oral Oral Oral  SpO2: 93% 97% 91% (!) 89%  Weight:      Height:        Last BM Date : 05/11/23  Intake/Output   Yesterday:  02/27 0701 - 02/28 0700 In: 1440 [P.O.:1440] Out: 900 [Urine:900] This shift:  Total I/O In: 120 [P.O.:120] Out: -   Bowel function:  Flatus: YES  BM:  No  Drain: (No drain)   Physical Exam:  General: Pt awake/alert in no acute distress Eyes:  PERRL, normal EOM.  Sclera clear.  No icterus Neuro: CN II-XII intact w/o focal sensory/motor deficits. Lymph: No head/neck/groin lymphadenopathy Psych:  No delerium/psychosis/paranoia.  Oriented x 4 HENT: Normocephalic, Mucus membranes moist.  No thrush Neck: Supple, No tracheal deviation.  No obvious thyromegaly Chest: No pain to chest wall compression.  Good respiratory excursion.  No audible wheezing CV:  Pulses intact.  Regular rhythm.  No major extremity edema MS: Normal AROM mjr joints.  No obvious deformity  Abdomen: Soft.  Nondistended.   Mild discomfort in left lower abdomen without any guarding or rebound tenderness.  Incisions clean dry and intact .  No evidence of peritonitis.  No incarcerated hernias.  Ext:   No deformity.  No mjr edema.  No cyanosis Skin: No petechiae / purpurea.  No major sores.  Warm and dry    Results:   Cultures: No results found for this or any previous visit (from the past 720 hours).  Labs: Results for orders placed or performed during the hospital encounter of 05/12/23 (from the past 48 hours)  Surgical pathology     Status: None   Collection Time: 05/12/23  3:12 PM  Result Value Ref Range   SURGICAL PATHOLOGY      SURGICAL PATHOLOGY CASE: [id number] PATIENT: Marcus Andrews Surgical Pathology Report     Clinical History: diverticulitis     FINAL MICROSCOPIC DIAGNOSIS:  A. RECTOSIGMOID COLON, RESECTION:      Segment of colon with diverticulosis.      Giant cell reaction, chronic inflammation, hemosiderin and pulse granuloma, features suggestive of incomplete preformation.      Negative for dysplasia or malignancy.  B. FINAL DISTAL MARGIN, EXCISION:      Segment of colon without significant diagnostic alteration.      Negative for dysplasia or malignancy.  Marcus Andrews DESCRIPTION:  A: Received fresh is a 17 cm segment of colon with the open end clinically identified as proximal.  There are adhesions present on the serosal  surface and in the midportion the attached mesentery is hemorrhagic and indurated.  The mucosa is glistening, tan and there are multiple diverticula present.  There are no perforations or abscesses grossly identified.  Sections are submitted in 5 casset tes. 1 = proximal margin 2 = distal margin 3, 4 = sections to include diverticula 5 = mesentery at area of hemorrhage and induration  B: Received fresh is a portion of colon measuring 1 cm in length and 2 cm in diameter.  The mucosa is glistening and tan.  Sections are submitted in 1 cassette.  [facility] 05/13/2023)   Final Diagnosis performed by Lance Coon, MD.   Electronically signed 05/14/2023 Technical component performed at [facility], [address] [place], [place] [address].  Professional component performed at [employer]. [facility], [address], [place], [place] [address].  Immunohistochemistry Technical component (if applicable) was performed at [facility]. [address], [place], [place] [address].   IMMUNOHISTOCHEMISTRY DISCLAIMER (if  applicable): Some of these immunohistochemical stains may have been developed and the performance characteristics determine by [facility]. Some may not  have been cleared or approved by the U.S. Food and Drug Administration. The FDA has determined that such clearance or approval is not necessary. This test is used for clinical purposes. It should not be regarded as investigational or for research. This laboratory is certified under the Clinical Laboratory Improvement Amendments of 1988 (CLIA-88) as qualified to perform high complexity clinical laboratory testing.  The controls stained appropriately.   IHC stains are performed on formalin fixed, paraffin embedded tissue using a 3,3"diaminobenzidine (DAB) chromogen and Leica Bond Autostainer System. The staining intensity of the nucleus is score manually and is reported as  the percentage of tumor cell nuclei demonstrating specific nuclear staining. The specimens are fixed in 10% Neutral Formalin for at least 6 hours and up to 72hrs. These tests are validated on decalcified tissue. Results should be interpreted with caution given the possibility of false negative results on  decalcified specimens. Antibody Clones are as follows ER-clone 27F, PR-clone 16, Ki67- clone MM1. Some of these immunohistochemical stains may have been developed and the performance characteristics determined by [facility].   Basic metabolic panel     Status: Abnormal   Collection Time: 05/13/23  5:10 AM  Result Value Ref Range   Sodium 132 (L) 135 - 145 mmol/L   Potassium 3.9 3.5 - 5.1 mmol/L   Chloride 99 98 - 111 mmol/L   CO2 25 22 - 32 mmol/L   Glucose, Bld 121 (H) 70 - 99 mg/dL    Comment: Glucose reference range applies only to samples taken after fasting for at least 8 hours.   BUN 11 8 - 23 mg/dL   Creatinine, Ser 5.62 0.61 - 1.24 mg/dL   Calcium 8.1 (L) 8.9 - 10.3 mg/dL   GFR, Estimated >13 >08 mL/min    Comment: (NOTE) Calculated using the CKD-EPI Creatinine Equation (2021)    Anion gap 8 5 - 15    Comment: Performed at [facility], [address] [place], [place] [address]  CBC     Status: Abnormal   Collection Time: 05/13/23  5:10 AM  Result Value Ref Range   WBC 9.7 4.0 - 10.5 K/uL   RBC 4.67 4.22 - 5.81 MIL/uL   Hemoglobin 12.9 (L) 13.0 - 17.0 g/dL   HCT 69.6 29.5 - 28.4 %   MCV 85.9 80.0 - 100.0 fL   MCH 27.6 26.0 - 34.0 pg   MCHC 32.2 30.0 - 36.0 g/dL   RDW 13.2 44.0 - 10.2 %   Platelets 238 150 - 400 K/uL   nRBC 0.0 0.0 - 0.2 %    Comment: Performed at [facility], [address] [place], [place] [address]  Magnesium     Status: None   Collection Time: 05/13/23  5:10 AM  Result Value Ref Range   Magnesium 1.9 1.7 - 2.4 mg/dL    Comment: Performed at [facility], [address], [place], [place] [address]    Imaging / Studies: No results found.  Medications / Allergies: per chart  Antibiotics: Anti-infectives (From admission, onward)    Start     Dose/Rate Route Frequency Ordered Stop   05/13/23 0100  cefoTEtan (CEFOTAN) 2 g in sodium chloride 0.9 % 100 mL IVPB        2 g 200 mL/hr over 30 Minutes Intravenous Every 12 hours 05/12/23  2226 05/13/23 0133   05/12/23 1400  neomycin (MYCIFRADIN) tablet 1,000 mg  Status:  Discontinued       Placed in "And" Linked Group   1,000 mg Oral 3 times per day 05/12/23 1022 05/12/23 1026   05/12/23 1400  metroNIDAZOLE (FLAGYL) tablet 1,000 mg  Status:  Discontinued       Placed in "And" Linked Group   1,000 mg Oral 3 times per day 05/12/23 1022 05/12/23 1026   05/12/23 1030  cefoTEtan (CEFOTAN) 2 g in sodium chloride 0.9 % 100 mL IVPB        2 g 200 mL/hr over 30 Minutes Intravenous On call to O.R. 05/12/23 1022 05/12/23 1418         Note: Portions of this report may have been transcribed using voice recognition software. Every effort was made to ensure accuracy; however, inadvertent computerized transcription errors may be present.   Any transcriptional errors that result from this process are unintentional.    Marcus Sportsman, MD, FACS, MASCRS Esophageal, Gastrointestinal & Colorectal Surgery Robotic and Minimally Invasive Surgery  [facility] [address], [address] [place], [place] [address] [phone] Fax [phone] Main  CONTACT INFORMATION: Weekday (9AM-5PM): Call [facility] main office at [phone] Weeknight (5PM-9AM) or Weekend/Holiday: Check EPIC "Web Links" tab & use "AMION" (password " [email or web address]") for General Surgery [facility] coverage  Please, DO NOT use SecureChat  (it is not reliable communication to reach operating surgeons & will lead to a delay in care).   Epic staff messaging available for outptient concerns needing 1-2 business day response.       05/14/2023  11:28 AM

## 2023-05-14 NOTE — Progress Notes (Addendum)
 Reported incidence of bloody pad in garbage and when patient wiped after having bowel movement. Educated patient on allowing this nurse to see his bowel movements before he flushes.  Patient up ambulatory in hall way at this time. Steady gait noted. Another BM noted to toilet with moderate amount of blood noted to tissue in toilet. Patient denies dizziness or lightheadedness while up walking. Will continue to monitor rectal bleeding.  0002- Called to room by patient and padding noted in garbage is soiled with blood. Patient is concerned that its much more bleeding that it was before. This nurse paged Dr. Karie Soda on call to make aware of patient's continued rectal bleeding which has increased from beginning of shift. Continues to saturate perineal pads. Explained to patient that dark red bleeding is normal and to report large amounts of bright red oozing bleeding. Will continue to monitor bleeding at this time. Vitals taken. WNL. No distress at this time. Continues to give scheduled and prn pain meds.

## 2023-05-15 LAB — HEMOGLOBIN: Hemoglobin: 11.9 g/dL — ABNORMAL LOW (ref 13.0–17.0)

## 2023-05-15 MED ORDER — TAB-A-VITE/IRON PO TABS
1.0000 | ORAL_TABLET | Freq: Every day | ORAL | Status: DC
  Administered 2023-05-15: 1 via ORAL
  Filled 2023-05-15: qty 1

## 2023-05-15 NOTE — Plan of Care (Signed)
  Problem: Education: Goal: Understanding of discharge needs will improve Outcome: Progressing   Problem: Education: Goal: Verbalization of understanding of the causes of altered bowel function will improve Outcome: Progressing   Problem: Activity: Goal: Ability to tolerate increased activity will improve Outcome: Progressing   Problem: Bowel/Gastric: Goal: Gastrointestinal status for postoperative course will improve Outcome: Progressing

## 2023-05-15 NOTE — Discharge Summary (Signed)
 Physician Discharge Summary    Marcus Andrews MRN: [id number] DOB/AGE: Aug 27, 1960 = 63 y.o.  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   It is a 31mm EEA anastomosis ( distal descending colon connected to proximal rectum.)  It rests 12 cm from the anal verge by flexible sigmoidoscopy   CASE DATA: Type of patient?: Elective [facility] Private Case Status of Case? Elective Scheduled Infection Present At Time Of Surgery (PATOS)?  ABSCESS  PATHOLOGY:  FINAL MICROSCOPIC DIAGNOSIS:   A. RECTOSIGMOID COLON, RESECTION:       Segment of colon with diverticulosis.       Giant cell reaction, chronic inflammation, hemosiderin and pulse  granuloma, features suggestive of incomplete preformation.       Negative for dysplasia or malignancy.   B. FINAL DISTAL MARGIN, EXCISION:       Segment of colon without significant diagnostic alteration.       Negative for dysplasia or malignancy.     Consults: Case Management / Social Work and Nutrition  Hospital Course:   The patient underwent the surgery above.  Postoperatively, the patient gradually mobilized and advanced to a solid diet.  Pain and other symptoms were treated aggressively.  Did have an episode of hematochezia of old clots.  However no drop in blood pressure and hemoglobin stable.  By the time of discharge, the patient was walking well the hallways, eating food, having flatus.  Pain was well-controlled on an oral medications.  Based on meeting discharge criteria and continuing to recover, I felt it was safe for the patient to be discharged from the hospital to further recover with close followup. Postoperative recommendations were discussed in detail.  They are written as well.  Discharged Condition: good  Discharge Exam: Blood pressure (!) 133/90, pulse 87, temperature 98.3 F (36.8 C), temperature source Oral, resp. rate 18, height 6' (1.829 m), weight 81.6 kg, SpO2 93%.  General: Pt awake/alert/oriented x4 in No acute distress Eyes: PERRL, normal EOM.  Sclera clear.  No icterus Neuro: CN II-XII intact w/o focal sensory/motor deficits. Lymph: No head/neck/groin lymphadenopathy Psych:  No delerium/psychosis/paranoia.  Mildly anxious but consolable HENT: Normocephalic, Mucus membranes moist.  No thrush Neck: Supple, No tracheal deviation Chest:  No chest wall pain w good excursion CV:  Pulses intact.  Regular rhythm MS: Normal AROM mjr joints.  No obvious deformity Abdomen: Soft.  Nondistended.  Nontender.  Incisions clean dry and intact.  Minimal ecchymosis on Pfannenstiel incision.  No drainage.  No evidence of peritonitis.  No incarcerated hernias. Ext:  SCDs BLE.  No mjr  edema.  No cyanosis Skin: No petechiae / purpura   Disposition:    Follow-up Information     Karie Soda, MD Follow up on 05/31/2023.   Specialties: General Surgery, Colon and Rectal Surgery Why: To follow up after your  operation Contact information: [address] [place] [address] [phone]                 Discharge disposition: 01-Home or Self Care       Discharge Instructions     Call MD for:   Complete by: As directed    FEVER > 101.5 F  (temperatures < 101.5 F are not significant)   Call MD for:  extreme fatigue   Complete by: As directed    Call MD for:  persistant dizziness or light-headedness   Complete by: As directed    Call MD for:  persistant nausea and vomiting   Complete by: As directed    Call MD for:  redness, tenderness, or signs of infection (pain, swelling, redness, odor or green/yellow discharge around incision site)   Complete by: As directed    Call MD for:  severe uncontrolled pain   Complete by: As directed    Diet - low sodium heart healthy   Complete by: As directed    Start with a bland diet such as soups, liquids, starchy foods, low fat foods, etc. the first few days at home. Gradually advance to a solid, low-fat, high fiber diet by the end of the first week at home.   Add a fiber supplement to your diet (Metamucil, etc) If you feel full, bloated, or constipated, stay on a full liquid or pureed/blenderized diet for a few days until you feel better and are no longer constipated.   Discharge instructions   Complete by: As directed    See Discharge Instructions If you are not getting better after two weeks or are noticing you are getting worse, contact our office [phone] for further advice.  We may need to adjust your medications, re-evaluate you in the office, send you to the emergency room, or see what other things we can do to help. The clinic staff is available to answer your questions during regular business hours (8:30am-5pm).  Please don't hesitate to call and ask to speak to one of our nurses for clinical concerns.    A surgeon from [facility] is always on call at the hospitals 24 hours/day If you have a  medical emergency, go to the nearest emergency room or call 911.   Discharge wound care:   Complete by: As directed    It is good for closed incisions and even open wounds to be washed every day.  Shower every day.  Short baths are fine.  Wash the incisions and wounds clean with soap & water.    You may leave closed incisions open to air if it is dry.   You may cover the incision with clean gauze & replace it after your daily shower for comfort.  TEGADERM:  You have clear gauze band-aid dressings over your closed incision(s).  Remove the dressings 2/28, Saturday after surgery.   Driving Restrictions   Complete by: As directed    You may drive when: - you are no longer taking narcotic prescription pain medication - you can comfortably wear a seatbelt - you can safely make sudden turns/stops without pain.   Increase activity slowly   Complete by: As directed  Start light daily activities --- self-care, walking, climbing stairs- beginning the day after surgery.  Gradually increase activities as tolerated.  Control your pain to be active.  Stop when you are tired.  Ideally, walk several times a day, eventually an hour a day.   Most people are back to most day-to-day activities in a few weeks.  It takes 4-6 weeks to get back to unrestricted, intense activity. If you can walk 30 minutes without difficulty, it is safe to try more intense activity such as jogging, treadmill, bicycling, low-impact aerobics, swimming, etc. Save the most intensive and strenuous activity for last (Usually 4-8 weeks after surgery) such as sit-ups, heavy lifting, contact sports, etc.  Refrain from any intense heavy lifting or straining until you are off narcotics for pain control.  You will have off days, but things should improve week-by-week. DO NOT PUSH THROUGH PAIN.  Let pain be your guide: If it hurts to do something, don't do it.   Lifting restrictions   Complete by: As directed    If you can walk 30 minutes without  difficulty, it is safe to try more intense activity such as jogging, treadmill, bicycling, low-impact aerobics, swimming, etc. Save the most intensive and strenuous activity for last (Usually 4-8 weeks after surgery) such as sit-ups, heavy lifting, contact sports, etc.   Refrain from any intense heavy lifting or straining until you are off narcotics for pain control.  You will have off days, but things should improve week-by-week. DO NOT PUSH THROUGH PAIN.  Let pain be your guide: If it hurts to do something, don't do it.  Pain is your body warning you to avoid that activity for another week until the pain goes down.   May shower / Bathe   Complete by: As directed    May walk up steps   Complete by: As directed    Remove dressing in 48 hours   Complete by: As directed    Make sure all dressings have been removed on the second day after surgery = 2/28 Friday Leave incisions open to air.  OK to cover incisions with gauze or bandages as desired   Sexual Activity Restrictions   Complete by: As directed    You may have sexual intercourse when it is comfortable. If it hurts to do something, stop.       Allergies as of 05/15/2023   No Known Allergies      Medication List     STOP taking these medications    traMADol 50 MG tablet Commonly known as: ULTRAM       TAKE these medications    acetaminophen 325 MG tablet Commonly known as: TYLENOL Take 2 tablets (650 mg total) by mouth every 6 (six) hours as needed for mild pain, fever or headache.   albuterol 108 (90 Base) MCG/ACT inhaler Commonly known as: VENTOLIN HFA TAKE 2 PUFFS BY MOUTH EVERY 6 HOURS AS NEEDED FOR WHEEZE OR SHORTNESS OF BREATH   atorvastatin 10 MG tablet Commonly known as: LIPITOR Take 1 tablet (10 mg total) by mouth daily.   Ensure Take 237 mLs by mouth daily as needed (for supplementation- or, Boost). Or boost   lactose free nutrition Liqd Take 237 mLs by mouth daily as needed (for supplementation- or,  Ensure).   glycopyrrolate 1 MG tablet Commonly known as: Robinul Take 1 tablet (1 mg total) by mouth 2 (two) times daily.   HYDROcodone-acetaminophen 10-325 MG tablet Commonly known as: NORCO Take 0.5-1 tablets by  mouth every 6 (six) hours as needed for moderate pain (pain score 4-6) or severe pain (pain score 7-10).   Magnesium 250 MG Tabs Take 250 mg by mouth in the morning.   Normal Saline Flush 0.9 % Soln Use to flush abdominal drain with 5 mL sterile saline once daily   omeprazole 40 MG capsule Commonly known as: PRILOSEC TAKE 1 CAPSULE (40 MG TOTAL) BY MOUTH DAILY. What changed: when to take this               Discharge Care Instructions  (From admission, onward)           Start     Ordered   05/12/23 0000  Discharge wound care:       Comments: It is good for closed incisions and even open wounds to be washed every day.  Shower every day.  Short baths are fine.  Wash the incisions and wounds clean with soap & water.    You may leave closed incisions open to air if it is dry.   You may cover the incision with clean gauze & replace it after your daily shower for comfort.  TEGADERM:  You have clear gauze band-aid dressings over your closed incision(s).  Remove the dressings 2/28, Saturday after surgery.   05/12/23 1232            Significant Diagnostic Studies:  Results for orders placed or performed during the hospital encounter of 05/12/23 (from the past 72 hours)  Surgical pathology     Status: None   Collection Time: 05/12/23  3:12 PM  Result Value Ref Range   SURGICAL PATHOLOGY      SURGICAL PATHOLOGY CASE: [id number] PATIENT: [name] Surgical Pathology Report     Clinical History: diverticulitis     FINAL MICROSCOPIC DIAGNOSIS:  A. RECTOSIGMOID COLON, RESECTION:      Segment of colon with diverticulosis.      Giant cell reaction, chronic inflammation, hemosiderin and pulse granuloma, features suggestive of incomplete  preformation.      Negative for dysplasia or malignancy.  B. FINAL DISTAL MARGIN, EXCISION:      Segment of colon without significant diagnostic alteration.      Negative for dysplasia or malignancy.  Denyla Cortese DESCRIPTION:  A: Received fresh is a 17 cm segment of colon with the open end clinically identified as proximal.  There are adhesions present on the serosal surface and in the midportion the attached mesentery is hemorrhagic and indurated.  The mucosa is glistening, tan and there are multiple diverticula present.  There are no perforations or abscesses grossly identified.  Sections are submitted in 5 casset tes. 1 = proximal margin 2 = distal margin 3, 4 = sections to include diverticula 5 = mesentery at area of hemorrhage and induration  B: Received fresh is a portion of colon measuring 1 cm in length and 2 cm in diameter.  The mucosa is glistening and tan.  Sections are submitted in 1 cassette.  [facility] 05/13/2023)   Final Diagnosis performed by Lance Coon, MD.   Electronically signed 05/14/2023 Technical component performed at [facility] [address] [place], [place] [address].  Professional component performed at [employer]. [facility], [address] [place], [place] [address].  Immunohistochemistry Technical component (if applicable) was performed at [facility]. [address], [place], [place] [address].   IMMUNOHISTOCHEMISTRY DISCLAIMER (if applicable): Some of these immunohistochemical stains may have been developed and the performance  characteristics determine by [facility]. Some may not  have been cleared or approved by the U.S. Food and Drug Administration. The FDA has determined that such clearance or approval is not necessary. This test is used for clinical purposes. It should not be regarded as investigational or for research. This laboratory is certified under the Clinical Laboratory  Improvement Amendments of 1988 (CLIA-88) as qualified to perform high complexity clinical laboratory testing.  The controls stained appropriately.   IHC stains are performed on formalin fixed, paraffin embedded tissue using a 3,3"diaminobenzidine (DAB) chromogen and Leica Bond Autostainer System. The staining intensity of the nucleus is score manually and is reported as the percentage of tumor cell nuclei demonstrating specific nuclear staining. The specimens are fixed in 10% Neutral Formalin for at least 6 hours and up to 72hrs. These tests are validated on decalcified tissue. Results should be interpreted with caution given the possibility of false negative results on  decalcified specimens. Antibody Clones are as follows ER-clone 62F, PR-clone 16, Ki67- clone MM1. Some of these immunohistochemical stains may have been developed and the performance characteristics determined by [facility].   Basic metabolic panel     Status: Abnormal   Collection Time: 05/13/23  5:10 AM  Result Value Ref Range   Sodium 132 (L) 135 - 145 mmol/L   Potassium 3.9 3.5 - 5.1 mmol/L   Chloride 99 98 - 111 mmol/L   CO2 25 22 - 32 mmol/L   Glucose, Bld 121 (H) 70 - 99 mg/dL    Comment: Glucose reference range applies only to samples taken after fasting for at least 8 hours.   BUN 11 8 - 23 mg/dL   Creatinine, Ser 1.61 0.61 - 1.24 mg/dL   Calcium 8.1 (L) 8.9 - 10.3 mg/dL   GFR, Estimated >09 >60 mL/min    Comment: (NOTE) Calculated using the CKD-EPI Creatinine Equation (2021)    Anion gap 8 5 - 15    Comment: Performed at [facility], [address] [place], [place] [address]  CBC     Status: Abnormal   Collection Time: 05/13/23  5:10 AM  Result Value Ref Range   WBC 9.7 4.0 - 10.5 K/uL   RBC 4.67 4.22 - 5.81 MIL/uL   Hemoglobin 12.9 (L) 13.0 - 17.0 g/dL   HCT 81.1 91.4 - 78.2 %   MCV 85.9 80.0 - 100.0 fL   MCH 27.6 26.0 - 34.0 pg   MCHC 32.2 30.0 - 36.0 g/dL   RDW  95.6 21.3 - 08.6 %   Platelets 238 150 - 400 K/uL   nRBC 0.0 0.0 - 0.2 %    Comment: Performed at [facility], [address], [place], [place] [address]  Magnesium     Status: None   Collection Time: 05/13/23  5:10 AM  Result Value Ref Range   Magnesium 1.9 1.7 - 2.4 mg/dL    Comment: Performed at [facility], [address], [place], [place] [address]  Hemoglobin     Status: Abnormal   Collection Time: 05/15/23  9:18 AM  Result Value Ref Range   Hemoglobin 11.9 (L) 13.0 - 17.0 g/dL    Comment: Performed at [facility], [address], [place], [place] [address]    No results found.  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     Past Surgical History:  Procedure Laterality Date   COLONOSCOPY     FLEXIBLE SIGMOIDOSCOPY N/A 05/12/2023   Procedure: FLEXIBLE SIGMOIDOSCOPY;  Surgeon: Karie Soda, MD;  Location: [facility];  Service: General;  Laterality: N/A;   IR RADIOLOGIST EVAL & MGMT  03/30/2023   KNEE ARTHROSCOPY WITH MEDIAL MENISECTOMY Right 02/05/2020   Procedure: Right knee arthroscopy with partial medial and lateral menisectomy, chondroplasty, partial synovectomy, removal loose body;  Surgeon: Lyndle Herrlich, MD;  Location: [facility] ORS;  Service: Orthopedics;  Laterality: Right;   MENISCUS REPAIR Left 01/2014   TOOTH EXTRACTION     UPPER GASTROINTESTINAL ENDOSCOPY  05/29/2021    Social History   Socioeconomic History   Marital status: Single    Spouse name: Not on file   Number of children: 1   Years of education: Not on file   Highest education level: Not on file  Occupational History   Occupation: owner/operator  Tobacco Use   Smoking status: Former    Current packs/day: 0.00    Types: Cigarettes    Quit date: 05/28/2018    Years since quitting: 4.9   Smokeless tobacco: Never   Vaping Use   Vaping status: Never Used  Substance and Sexual Activity   Alcohol use: Not Currently    Alcohol/week: 1.0 standard drink of alcohol    Types: 1 Cans of beer per week   Drug use: Never   Sexual activity: Not Currently    Birth control/protection: Condom  Other Topics Concern   Not on file  Social History Narrative   05/22/21   From: the area   Living: alone   Work: Teacher, adult education      Family: family is all over the place, has cousins which are local in [place]      Enjoys: learning to play guitar, DJ, music, Counsellor      Exercise: knee/back pain limit this   Diet: could be better - avoids red meats/pork      Safety   Seat belts: Yes    Guns: No   Safe in relationships: Yes       Daughter: 40   Social Drivers of Corporate investment banker Strain: Not on file  Food Insecurity: No Food Insecurity (05/12/2023)   Hunger Vital Sign    Worried About Running Out of Food in the Last Year: Never true    Ran Out of Food in the Last Year: Never true  Transportation Needs: No Transportation Needs (05/12/2023)   PRAPARE - Administrator, Civil Service (Medical): No    Lack of Transportation (Non-Medical): No  Physical Activity: Not on file  Stress: Not on file  Social Connections: Not on file  Intimate Partner Violence: Not At Risk (05/12/2023)   Humiliation, Afraid, Rape, and Kick questionnaire    Fear of Current or Ex-Partner: No    Emotionally Abused: No    Physically Abused: No    Sexually Abused: No    Family History  Problem Relation Age of Onset   Breast cancer Mother    Colon cancer Mother 20   Colon polyps Mother    Lymphoma Mother    Heart disease Father    Heart failure Father    Heart attack Father 64   Hypertension Sister    Hyperlipidemia Sister    Asthma Sister    Aneurysm Sister    Hypertension Brother    Hyperlipidemia Brother  Deep vein thrombosis Brother    Heart attack Brother        50-60    Hypertension Maternal Grandmother    Diabetes Maternal Grandmother    Hypertension Maternal Aunt    Cancer Maternal Aunt        type unknown   Hypertension Maternal Uncle    Liver cancer Maternal Uncle    Hypertension Paternal Aunt    Hypertension Paternal Uncle    Esophageal cancer Neg Hx    Rectal cancer Neg Hx    Stomach cancer Neg Hx     Current Facility-Administered Medications  Medication Dose Route Frequency Provider Last Rate Last Admin   0.9 %  sodium chloride infusion  250 mL Intravenous PRN Karie Soda, MD       acetaminophen (TYLENOL) tablet 500 mg  500 mg Oral TID WC & HS Karie Soda, MD   500 mg at 05/15/23 0944   albuterol (PROVENTIL) (2.5 MG/3ML) 0.083% nebulizer solution 2.5 mg  2.5 mg Nebulization Q6H PRN Karie Soda, MD       alum & mag hydroxide-simeth (MAALOX/MYLANTA) 200-200-20 MG/5ML suspension 30 mL  30 mL Oral Q6H PRN Karie Soda, MD       alvimopan (ENTEREG) capsule 12 mg  12 mg Oral BID Karie Soda, MD   12 mg at 05/15/23 0943   atorvastatin (LIPITOR) tablet 10 mg  10 mg Oral Daily Karie Soda, MD   10 mg at 05/15/23 0942   diphenhydrAMINE (BENADRYL) 12.5 MG/5ML elixir 12.5 mg  12.5 mg Oral Q6H PRN Karie Soda, MD       Or   diphenhydrAMINE (BENADRYL) injection 12.5 mg  12.5 mg Intravenous Q6H PRN Karie Soda, MD       enoxaparin (LOVENOX) injection 40 mg  40 mg Subcutaneous Q24H Karie Soda, MD   40 mg at 05/14/23 0804   feeding supplement (ENSURE SURGERY) liquid 237 mL  237 mL Oral BID BM Karie Soda, MD   237 mL at 05/15/23 0944   gabapentin (NEURONTIN) capsule 300 mg  300 mg Oral TID Karie Soda, MD   300 mg at 05/15/23 0949   glycopyrrolate (ROBINUL) tablet 1 mg  1 mg Oral BID Karie Soda, MD   1 mg at 05/15/23 4098   hydrALAZINE (APRESOLINE) injection 10 mg  10 mg Intravenous Q2H PRN Karie Soda, MD       HYDROmorphone (DILAUDID) injection 0.5-2 mg  0.5-2 mg Intravenous Q4H PRN Karie Soda, MD   2 mg at 05/15/23 0606    lactated ringers infusion   Intravenous Q8H PRN Karie Soda, MD       magic mouthwash  15 mL Oral QID PRN Karie Soda, MD       melatonin tablet 3 mg  3 mg Oral QHS PRN Karie Soda, MD   3 mg at 05/14/23 2155   menthol-cetylpyridinium (CEPACOL) lozenge 3 mg  1 lozenge Oral PRN Karie Soda, MD       methocarbamol (ROBAXIN) tablet 1,000 mg  1,000 mg Oral Q6H Karie Soda, MD   1,000 mg at 05/15/23 0530   metoprolol tartrate (LOPRESSOR) injection 5 mg  5 mg Intravenous Q6H PRN Karie Soda, MD       multivitamins with iron tablet 1 tablet  1 tablet Oral Daily Karie Soda, MD   1 tablet at 05/15/23 1191   naphazoline-glycerin (CLEAR EYES REDNESS) ophth solution 1-2 drop  1-2 drop Both Eyes QID PRN Karie Soda, MD       ondansetron [facility])  tablet 4 mg  4 mg Oral Q6H PRN Karie Soda, MD       Or   ondansetron [facility]) injection 4 mg  4 mg Intravenous Q6H PRN Karie Soda, MD       pantoprazole (PROTONIX) EC tablet 40 mg  40 mg Oral Q1200 Karie Soda, MD   40 mg at 05/14/23 1113   phenol (CHLORASEPTIC) mouth spray 2 spray  2 spray Mouth/Throat PRN Karie Soda, MD       polycarbophil (FIBERCON) tablet 625 mg  625 mg Oral BID Karie Soda, MD   625 mg at 05/15/23 0944   prochlorperazine (COMPAZINE) tablet 10 mg  10 mg Oral Q6H PRN Karie Soda, MD       Or   prochlorperazine (COMPAZINE) injection 5-10 mg  5-10 mg Intravenous Q6H PRN Karie Soda, MD       simethicone (MYLICON) chewable tablet 40 mg  40 mg Oral Q6H PRN Karie Soda, MD   40 mg at 05/13/23 1232   sodium chloride (OCEAN) 0.65 % nasal spray 1-2 spray  1-2 spray Each Nare Q6H PRN Karie Soda, MD       sodium chloride flush (NS) 0.9 % injection 3 mL  3 mL Intravenous Catha Gosselin, MD   3 mL at 05/15/23 0945   sodium chloride flush (NS) 0.9 % injection 3 mL  3 mL Intravenous PRN Karie Soda, MD       traMADol Janean Sark) tablet 50-100 mg  50-100 mg Oral Q6H PRN Karie Soda, MD   100 mg at 05/15/23 1914      No Known Allergies  Signed:   Ardeth Sportsman, MD, FACS, MASCRS Esophageal, Gastrointestinal & Colorectal Surgery Robotic and Minimally Invasive Surgery  [facility] [address] [place], [place] [address] [phone] Fax [phone] Main  CONTACT INFORMATION: Weekday (9AM-5PM): Call [facility] main office at [phone] Weeknight (5PM-9AM) or Weekend/Holiday: Check EPIC "Web Links" tab & use "AMION" (password " [email or web address]") for General Surgery [facility] coverage  Please, DO NOT use SecureChat  (it is not reliable communication to reach operating surgeons & will lead to a delay in care).   Epic staff messaging available for outptient concerns needing 1-2 business day response.      05/15/2023, 10:31 AM

## 2023-05-17 ENCOUNTER — Telehealth: Payer: Self-pay

## 2023-05-17 NOTE — Transitions of Care (Post Inpatient/ED Visit) (Signed)
 05/17/2023  Name: Marcus Andrews MRN: [id number] DOB: 1961-01-03  Today's TOC FU Call Status: Today's TOC FU Call Status:: Successful TOC FU Call Completed TOC FU Call Complete Date: 05/17/23 Patient's Name and Date of Birth confirmed.  Transition Care Management Follow-up Telephone Call Date of Discharge: 05/15/23 Discharge Facility: [facility]) Type of Discharge: Inpatient Admission Primary Inpatient Discharge Diagnosis:: Bowel Resection How have you been since you were released from the hospital?: Better Any questions or concerns?: No  Items Reviewed: Did you receive and understand the discharge instructions provided?: Yes Medications obtained,verified, and reconciled?: Yes (Medications Reviewed) Any new allergies since your discharge?: No Dietary orders reviewed?: Yes Type of Diet Ordered:: Low sodium Heart Healthy Do you have support at home?: Yes People in Home: sibling(s) Name of Support/Comfort Primary Source: [place]  Medications Reviewed Today: Medications Reviewed Today     Reviewed by Redge Gainer, RN (Case Manager) on 05/17/23 at 1506  Med List Status: <None>   Medication Order Taking? Sig Documenting Provider Last Dose Status Informant  acetaminophen (TYLENOL) 325 MG tablet [id number] No Take 2 tablets (650 mg total) by mouth every 6 (six) hours as needed for mild pain, fever or headache. Lonia Blood, MD Past Week Active Self  albuterol (VENTOLIN HFA) 108 (90 Base) MCG/ACT inhaler [id number] No TAKE 2 PUFFS BY MOUTH EVERY 6 HOURS AS NEEDED FOR WHEEZE OR SHORTNESS OF BREATH  Patient taking differently: Inhale 2 puffs into the lungs every 6 (six) hours as needed for shortness of breath or wheezing.   Mort Sawyers, FNP Unknown Active Self  atorvastatin (LIPITOR) 10 MG tablet [id number] No Take 1 tablet (10 mg total) by mouth daily.  Patient not taking: Reported on 03/16/2023   Mort Sawyers, FNP Not Taking Active Self  Ensure [facility]) [id number] No  Take 237 mLs by mouth daily as needed (for supplementation- or, Boost). Or boost [provider] Past Week Active Self  glycopyrrolate (ROBINUL) 1 MG tablet [id number] No Take 1 tablet (1 mg total) by mouth 2 (two) times daily. Meryl Dare, MD Past Week Active Self  HYDROcodone-acetaminophen [facility]) 10-325 MG tablet [id number]  Take 0.5-1 tablets by mouth every 6 (six) hours as needed for moderate pain (pain score 4-6) or severe pain (pain score 7-10). Karie Soda, MD  Active   lactose free nutrition (BOOST) LIQD [id number] No Take 237 mLs by mouth daily as needed (for supplementation- or, Ensure). [provider] Past Week Active Self  Magnesium 250 MG TABS [id number] No Take 250 mg by mouth in the morning. [provider] Past Week Active Self  omeprazole (PRILOSEC) 40 MG capsule [id number] No TAKE 1 CAPSULE (40 MG TOTAL) BY MOUTH DAILY.  Patient taking differently: Take 40 mg by mouth daily before breakfast.   Meryl Dare, MD Past Week Active Self  sodium chloride flush 0.9 % SOLN injection [id number]  Use to flush abdominal drain with 5 mL sterile saline once daily   Active             Home Care and Equipment/Supplies: Were Home Health Services Ordered?: NA Any new equipment or medical supplies ordered?: NA  Functional Questionnaire: Do you need assistance with bathing/showering or dressing?: No Do you need assistance with meal preparation?: No Do you need assistance with eating?: No Do you have difficulty maintaining continence: No Do you need assistance with getting out of bed/getting out of a chair/moving?: No Do you have difficulty managing or taking your medications?: No  Follow  up appointments reviewed: PCP Follow-up appointment confirmed?: Yes Date of PCP follow-up appointment?: 05/19/23 Follow-up Provider: [facility] Follow-up appointment confirmed?: Yes Date of Specialist follow-up appointment?: 05/31/23 Follow-Up  Specialty Provider:: Karie Soda Do you need transportation to your follow-up appointment?: No Do you understand care options if your condition(s) worsen?: Yes-patient verbalized understanding  SDOH Interventions Today    Flowsheet Row Most Recent Value  SDOH Interventions   Food Insecurity Interventions Intervention Not Indicated  Housing Interventions Intervention Not Indicated  Transportation Interventions Intervention Not Indicated  Utilities Interventions Intervention Not Indicated      Interventions Today    Flowsheet Row Most Recent Value  Chronic Disease   Chronic disease during today's visit Other  [Diverticulitis]  General Interventions   General Interventions Discussed/Reviewed General Interventions Discussed, General Interventions Reviewed, Doctor Visits  Doctor Visits Discussed/Reviewed Doctor Visits Reviewed  Exercise Interventions   Exercise Discussed/Reviewed Physical Activity  Physical Activity Discussed/Reviewed Physical Activity Discussed  Education Interventions   Education Provided Provided Education  Provided Verbal Education On When to see the doctor, Medication, Insurance Plans  Pharmacy Interventions   Pharmacy Dicussed/Reviewed Medications and their functions       The patient has been provided with contact information for the care management team and has been advised to call with any health-related questions or concerns. The patient verbalized understanding with current POC. The patient is directed to their insurance card regarding availability of benefits coverage.  Deidre Ala, BSN, RN [place]  [facility] - [employer] [facility] RN Care Manager [phone]

## 2023-05-19 ENCOUNTER — Inpatient Hospital Stay: Admitting: Family

## 2023-05-24 ENCOUNTER — Encounter: Payer: 59 | Attending: Family | Admitting: Dietician

## 2023-05-24 VITALS — Wt 178.5 lb

## 2023-05-24 DIAGNOSIS — E785 Hyperlipidemia, unspecified: Secondary | ICD-10-CM | POA: Diagnosis not present

## 2023-05-24 DIAGNOSIS — R7303 Prediabetes: Secondary | ICD-10-CM | POA: Diagnosis present

## 2023-05-24 DIAGNOSIS — K5720 Diverticulitis of large intestine with perforation and abscess without bleeding: Secondary | ICD-10-CM | POA: Diagnosis not present

## 2023-05-24 DIAGNOSIS — Z713 Dietary counseling and surveillance: Secondary | ICD-10-CM | POA: Insufficient documentation

## 2023-05-24 DIAGNOSIS — R634 Abnormal weight loss: Secondary | ICD-10-CM | POA: Diagnosis not present

## 2023-05-24 NOTE — Progress Notes (Signed)
 Medical Nutrition Therapy  Appointment Start time:  [phone]  Appointment End time:  1025  Primary concerns today: eat properly and regain unintentional weight lost  Referral diagnosis: Dyslipidemia, Prediabetes, Diverticulitis of colon with perforation, Weight loss  Preferred learning style:   no preference indicated Learning readiness:   ready   NUTRITION ASSESSMENT    Clinical Medical Hx:  Past Medical History:  Diagnosis Date   Arthritis    Colon polyp    Diverticulosis    Pre-diabetes    Refusal of blood transfusions as patient is Jehovah's Witness     Medications:  Current Outpatient Medications:    acetaminophen (TYLENOL) 325 MG tablet, Take 2 tablets (650 mg total) by mouth every 6 (six) hours as needed for mild pain, fever or headache., Disp: , Rfl:    Ensure (ENSURE), Take 237 mLs by mouth daily as needed (for supplementation- or, Boost). Or boost, Disp: , Rfl:    glycopyrrolate (ROBINUL) 1 MG tablet, Take 1 tablet (1 mg total) by mouth 2 (two) times daily., Disp: 60 tablet, Rfl: 11   HYDROcodone-acetaminophen (NORCO) 10-325 MG tablet, Take 0.5-1 tablets by mouth every 6 (six) hours as needed for moderate pain (pain score 4-6) or severe pain (pain score 7-10)., Disp: 30 tablet, Rfl: 0   omeprazole (PRILOSEC) 40 MG capsule, TAKE 1 CAPSULE (40 MG TOTAL) BY MOUTH DAILY., Disp: 30 capsule, Rfl: 8   albuterol (VENTOLIN HFA) 108 (90 Base) MCG/ACT inhaler, TAKE 2 PUFFS BY MOUTH EVERY 6 HOURS AS NEEDED FOR WHEEZE OR SHORTNESS OF BREATH (Patient taking differently: Inhale 2 puffs into the lungs every 6 (six) hours as needed for shortness of breath or wheezing.), Disp: 18 each, Rfl: 0   atorvastatin (LIPITOR) 10 MG tablet, Take 1 tablet (10 mg total) by mouth daily. (Patient not taking: Reported on 03/16/2023), Disp: 90 tablet, Rfl: 3   lactose free nutrition (BOOST) LIQD, Take 237 mLs by mouth daily as needed (for supplementation- or, Ensure)., Disp: , Rfl:    Magnesium 250 MG TABS, Take  250 mg by mouth in the morning., Disp: , Rfl:    sodium chloride flush 0.9 % SOLN injection, Use to flush abdominal drain with 5 mL sterile saline once daily, Disp: 300 mL, Rfl: 3   Labs:  Lab Results  Component Value Date   HGBA1C 5.7 (H) 04/29/2023   Lab Results  Component Value Date   CHOL 234 (H) 07/15/2022   HDL 64.90 07/15/2022   LDLCALC 153 (H) 07/15/2022   TRIG 79.0 07/15/2022   CHOLHDL 4 07/15/2022   Lab Results  Component Value Date   NA 132 (L) 05/13/2023   CL 99 05/13/2023   K 3.9 05/13/2023   CO2 25 05/13/2023   BUN 11 05/13/2023   CREATININE 0.85 05/13/2023   GFRNONAA >60 05/13/2023   CALCIUM 8.1 (L) 05/13/2023   PHOS 3.7 12/02/2022   ALBUMIN 3.5 03/16/2023   GLUCOSE 121 (H) 05/13/2023   Lifestyle & Dietary Hx Pt present today alone. Pt reports he lives and alone and does the cooking and shopping. Pt reports s/p surgery 05/12/2023 for bowel perforation r/t diverticulitis. Pt reports he desires to eat properly and regain weight lost stating aiming for 180-185#. Pt reports he was instructed to follow a high fiber diet and would like support. Pt reports he will be out of work for the next 6 weeks. Pt reports she is allergic to guava and passion fruit and avoids these successfully.  Pt reports his intake varies based  on on his sleep schedule. Pt reports 2-3 meals with 1-3 snacks daily. Pt reports he sometimes does not feel hungry and will not eat. Pt reports avoid pork and beef. Pt reports he uses nutrition labels to determine sugar and salt intake. All Pt's questions were answered during this encounter    Estimated daily fluid intake: "1 gallon water" ~128 oz Supplements: none Sleep: sleep could improve; Pt reports upcoming sleep study  Stress / self-care: 4 out fo 10/ self care includes reading, prayer Current average weekly physical activity: walking 1 hour total daily  24-Hr Dietary Recall First Meal: eggs, grits, or eggs, pancakes, coffee with half and half and  sugar  Snack: none or boost/ensure Second Meal: 12-2 pm: chicken burger french fries or skips Snack: pretzels or cookies or cakes Third Meal: [employer] baked beans, french fries, chicken tenders, [place] toast, cherry coke Snack: cookies and ice cream  Beverages: water, coke, gingerale, sweet tea    NUTRITION DIAGNOSIS  NB-1.1 Food and nutrition-related knowledge deficit As related to no prior nutrition related education.  As evidenced by Pt reports and dietary recall.   NUTRITION INTERVENTION  Nutrition education (E-1) on the following topics:  Fruits & Vegetables: Aim to fill half your plate with a variety of fruits and vegetables. They are rich in vitamins, minerals, and fiber, and can help reduce the risk of chronic diseases. Choose a colorful assortment of fruits and vegetables to ensure you get a wide range of nutrients. Grains and Starches: Make at least half of your grain choices whole grains, such as brown rice, whole wheat bread, and oats. Whole grains provide fiber, which aids in digestion and healthy cholesterol levels. Aim for whole forms of starchy vegetables such as potatoes, sweet potatoes, beans, peas, and corn, which are fiber rich and provide many vitamins and minerals.  Protein: Incorporate lean sources of protein, such as poultry, fish, beans, nuts, and seeds, into your meals. Protein is essential for building and repairing tissues, staying full, balancing blood sugar, as well as supporting immune function. Dairy: Include low-fat or fat-free dairy products like milk, yogurt, and cheese in your diet. Dairy foods are excellent sources of calcium and vitamin D, which are crucial for bone health.  Physical Activity: Aim for 60 minutes of physical activity daily. Regular physical activity promotes overall health-including helping to reduce risk for heart disease and diabetes, promoting mental health, and helping [place] sleep better.    Handouts Provided Include  Heart Healthy Nutrition  label Plate planner with healthy food list Snack List   Learning Style & Readiness for Change Teaching method utilized: Visual & Auditory  Demonstrated degree of understanding via: Teach Back  Barriers to learning/adherence to lifestyle change: none  Goals Established by Pt Avoid carbohydrate containing beverages Use Nutrition labels to select heart healthy food items   MONITORING & EVALUATION Dietary intake, weekly physical activity  Next Steps  Patient is to 6 weeks.

## 2023-05-24 NOTE — Patient Instructions (Signed)
 Avoid carbohydrate containing beverages Use Nutrition labels to select heart healy food items

## 2023-05-27 ENCOUNTER — Ambulatory Visit: Admitting: Family

## 2023-06-01 ENCOUNTER — Inpatient Hospital Stay: Admitting: Family

## 2023-06-07 ENCOUNTER — Inpatient Hospital Stay: Admitting: Family

## 2023-06-08 DIAGNOSIS — Z79899 Other long term (current) drug therapy: Secondary | ICD-10-CM | POA: Diagnosis not present

## 2023-06-08 DIAGNOSIS — Z79891 Long term (current) use of opiate analgesic: Secondary | ICD-10-CM | POA: Diagnosis not present

## 2023-06-08 DIAGNOSIS — Z5181 Encounter for therapeutic drug level monitoring: Secondary | ICD-10-CM | POA: Diagnosis not present

## 2023-06-08 DIAGNOSIS — M170 Bilateral primary osteoarthritis of knee: Secondary | ICD-10-CM | POA: Diagnosis not present

## 2023-06-10 DIAGNOSIS — M25561 Pain in right knee: Secondary | ICD-10-CM | POA: Diagnosis not present

## 2023-06-10 DIAGNOSIS — M25562 Pain in left knee: Secondary | ICD-10-CM | POA: Diagnosis not present

## 2023-06-15 ENCOUNTER — Ambulatory Visit: Payer: 59 | Attending: Cardiology | Admitting: Cardiology

## 2023-06-15 ENCOUNTER — Encounter: Payer: Self-pay | Admitting: Cardiology

## 2023-06-15 VITALS — BP 144/90 | HR 64 | Ht 72.0 in | Wt 175.4 lb

## 2023-06-15 DIAGNOSIS — E7800 Pure hypercholesterolemia, unspecified: Secondary | ICD-10-CM | POA: Diagnosis not present

## 2023-06-15 DIAGNOSIS — R030 Elevated blood-pressure reading, without diagnosis of hypertension: Secondary | ICD-10-CM

## 2023-06-15 DIAGNOSIS — Z8249 Family history of ischemic heart disease and other diseases of the circulatory system: Secondary | ICD-10-CM | POA: Diagnosis not present

## 2023-06-15 DIAGNOSIS — I2510 Atherosclerotic heart disease of native coronary artery without angina pectoris: Secondary | ICD-10-CM | POA: Diagnosis not present

## 2023-06-15 MED ORDER — PRAVASTATIN SODIUM 20 MG PO TABS: 20.0000 mg | ORAL_TABLET | Freq: Every evening | ORAL | 3 refills | Status: DC

## 2023-06-15 MED ORDER — ASPIRIN 81 MG PO TBEC: 81.0000 mg | DELAYED_RELEASE_TABLET | Freq: Every day | ORAL | Status: AC

## 2023-06-15 MED ORDER — METOPROLOL TARTRATE 50 MG PO TABS: ORAL_TABLET | ORAL | 0 refills | Status: DC

## 2023-06-15 NOTE — Progress Notes (Signed)
 Cardiology Office Note:    Date:  06/15/2023   ID:  Marcus Andrews, DOB [birth year], MRN [id number]  PCP:  Mort Sawyers, FNP   [facility] Providers Cardiologist:  None     Referring MD: Mort Sawyers, FNP   No chief complaint on file.  Marcus Andrews is a 63 y.o. male who is being seen today for the evaluation of aortic atherosclerosis at the request of Mort Sawyers, FNP.   History of Present Illness:    Marcus Andrews is a 63 y.o. male with a hx of family history of early CAD, former smoker x 35+ years, diverticulitis who presents due to aortic atherosclerosis.  Patient had a chest CT 04/14/2023 for lung cancer screening, transverse aorta atherosclerosis noted, minimal RCA calcification also noted.  Would like to get cardiac testing due to strong family history of heart disease.  Father had a heart attack in his early 54s, 2 older brothers had heart attacks in the early 32s, sister had stent placement in her 59s.  He smoked for 38 years, all his family with CAD were also smokers.  He tried Lipitor in the past but this caused muscle cramps.  Denies chest pain or shortness of breath.  Past Medical History:  Diagnosis Date   Arthritis    Colon polyp    Diverticulosis    Pre-diabetes    Refusal of blood transfusions as patient is Jehovah's Witness     Past Surgical History:  Procedure Laterality Date   COLONOSCOPY     FLEXIBLE SIGMOIDOSCOPY N/A 05/12/2023   Procedure: FLEXIBLE SIGMOIDOSCOPY;  Surgeon: Karie Soda, MD;  Location: [facility];  Service: General;  Laterality: N/A;   IR RADIOLOGIST EVAL & MGMT  03/30/2023   KNEE ARTHROSCOPY WITH MEDIAL MENISECTOMY Right 02/05/2020   Procedure: Right knee arthroscopy with partial medial and lateral menisectomy, chondroplasty, partial synovectomy, removal loose body;  Surgeon: Lyndle Herrlich, MD;  Location: [facility] OR[facility];  Service: Orthopedics;  Laterality: Right;   MENISCUS REPAIR Left 01/2014   TOOTH EXTRACTION     UPPER  GASTROINTESTINAL ENDOSCOPY  05/29/2021    Current Medications: Current Meds  Medication Sig   acetaminophen (TYLENOL) 325 MG tablet Take 2 tablets (650 mg total) by mouth every 6 (six) hours as needed for mild pain, fever or headache.   aspirin EC 81 MG tablet Take 1 tablet (81 mg total) by mouth daily. Swallow whole.   metoprolol tartrate (LOPRESSOR) 50 MG tablet TAKE BOTH TABLETS 2 HR PRIOR TO CARDIAC PROCEDURE   omeprazole (PRILOSEC) 40 MG capsule TAKE 1 CAPSULE (40 MG TOTAL) BY MOUTH DAILY.   pravastatin (PRAVACHOL) 20 MG tablet Take 1 tablet (20 mg total) by mouth every evening.     Allergies:   Patient has no known allergies.   Social History   Socioeconomic History   Marital status: Single    Spouse name: Not on file   Number of children: 1   Years of education: Not on file   Highest education level: Not on file  Occupational History   Occupation: owner/operator  Tobacco Use   Smoking status: Former    Current packs/day: 0.00    Types: Cigarettes    Quit date: 05/28/2018    Years since quitting: 5.0   Smokeless tobacco: Never  Vaping Use   Vaping status: Never Used  Substance and Sexual Activity   Alcohol use: Not Currently    Alcohol/week: 1.0 standard drink of alcohol    Types: 1 Cans  of beer per week   Drug use: Never   Sexual activity: Not Currently    Birth control/protection: Condom  Other Topics Concern   Not on file  Social History Narrative   05/22/21   From: the area   Living: alone   Work: Teacher, adult education      Family: family is all over the place, has cousins which are local in [place]      Enjoys: learning to play guitar, DJ, music, Counsellor      Exercise: knee/back pain limit this   Diet: could be better - avoids red meats/pork      Safety   Seat belts: Yes    Guns: No   Safe in relationships: Yes       Daughter: 34   Social Drivers of Corporate investment banker Strain: Not on file  Food Insecurity: No Food  Insecurity (05/24/2023)   Hunger Vital Sign    Worried About Running Out of Food in the Last Year: Never true    Ran Out of Food in the Last Year: Never true  Transportation Needs: No Transportation Needs (05/17/2023)   PRAPARE - Administrator, Civil Service (Medical): No    Lack of Transportation (Non-Medical): No  Physical Activity: Not on file  Stress: Not on file  Social Connections: Not on file     Family History: The patient's family history includes Aneurysm in his sister; Asthma in his sister; Breast cancer in his mother; Cancer in his maternal aunt; Colon cancer (age of onset: 67) in his mother; Colon polyps in his mother; Deep vein thrombosis in his brother; Diabetes in his maternal grandmother; Heart attack in his brother; Heart attack (age of onset: 90) in his father; Heart disease in his father; Heart failure in his father; Hyperlipidemia in his brother and sister; Hypertension in his brother, maternal aunt, maternal grandmother, maternal uncle, paternal aunt, paternal uncle, and sister; Liver cancer in his maternal uncle; Lymphoma in his mother. There is no history of Esophageal cancer, Rectal cancer, or Stomach cancer.  ROS:   Please see the history of present illness.     All other systems reviewed and are negative.  EKGs/Labs/Other Studies Reviewed:    The following studies were reviewed today:       Recent Labs: 12/01/2022: TSH 1.690 03/16/2023: ALT 13 05/13/2023: BUN 11; Creatinine, Ser 0.85; Magnesium 1.9; Platelets 238; Potassium 3.9; Sodium 132 05/15/2023: Hemoglobin 11.9  Recent Lipid Panel    Component Value Date/Time   CHOL 234 (H) 07/15/2022 1004   TRIG 79.0 07/15/2022 1004   HDL 64.90 07/15/2022 1004   CHOLHDL 4 07/15/2022 1004   VLDL 15.8 07/15/2022 1004   LDLCALC 153 (H) 07/15/2022 1004     Risk Assessment/Calculations:            Physical Exam:    VS:  BP (!) 144/90   Pulse 64   Ht 6' (1.829 m)   Wt 175 lb 6.4 oz (79.6 kg)    SpO2 98%   BMI 23.79 kg/m     Wt Readings from Last 3 Encounters:  06/15/23 175 lb 6.4 oz (79.6 kg)  05/24/23 178 lb 8 oz (81 kg)  05/12/23 180 lb (81.6 kg)     GEN:  Well nourished, well developed in no acute distress HEENT: Normal NECK: No JVD; No carotid bruits CARDIAC: RRR, no murmurs, rubs, gallops RESPIRATORY:  Clear to auscultation without rales, wheezing or rhonchi  ABDOMEN: Soft, non-tender,  non-distended MUSCULOSKELETAL:  No edema; No deformity  SKIN: Warm and dry NEUROLOGIC:  Alert and oriented x 3 PSYCHIATRIC:  Normal affect   ASSESSMENT:    1. Coronary artery disease involving native heart without angina pectoris, unspecified vessel or lesion type   2. Pure hypercholesterolemia   3. Elevated BP without diagnosis of hypertension   4. Family history of early CAD    PLAN:    In order of problems listed above:  CAD, RCA calcifications on chest CT.  Strong family history of early CAD.  Patient is a former smoker.  Get echocardiogram, get coronary CTA.  Start aspirin 81 mg daily, pravastatin 20 mg daily.  Lipitor caused muscle cramps.  Consider Zetia versus PCSK9 if patient is not tolerant to pravastatin. Hyperlipidemia, start pravastatin 20 mg daily. Elevated BP, BP usually controlled.  Monitor off medications.  Follow-up after cardiac testing      Medication Adjustments/Labs and Tests Ordered: Current medicines are reviewed at length with the patient today.  Concerns regarding medicines are outlined above.  Orders Placed This Encounter  Procedures   CT CORONARY MORPH W/CTA COR W/SCORE W/CA W/CM &/OR WO/CM   Basic metabolic panel with GFR   ECHOCARDIOGRAM COMPLETE   Meds ordered this encounter  Medications   metoprolol tartrate (LOPRESSOR) 50 MG tablet    Sig: TAKE BOTH TABLETS 2 HR PRIOR TO CARDIAC PROCEDURE    Dispense:  2 tablet    Refill:  0   aspirin EC 81 MG tablet    Sig: Take 1 tablet (81 mg total) by mouth daily. Swallow whole.   pravastatin  (PRAVACHOL) 20 MG tablet    Sig: Take 1 tablet (20 mg total) by mouth every evening.    Dispense:  90 tablet    Refill:  3    Patient Instructions  Medication Instructions:  Your physician recommends the following medication changes.  TAKE ONCE: Metoprolol 100 mg approximately 2 hours before your CTA  START TAKING: Aspirin 81 mg daily Pravastatin 20 mg daily  *If you need a refill on your cardiac medications before your next appointment, please call your pharmacy*  Lab Work: Your provider would like for you to have following labs drawn today BMeT.   If you have labs (blood work) drawn today and your tests are completely normal, you will receive your results only by: MyChart Message (if you have MyChart) OR A paper copy in the mail If you have any lab test that is abnormal or we need to change your treatment, we will call you to review the results.  Testing/Procedures: Your cardiac CT will be scheduled at one of the below locations:   [facility] [address] [place], [place] [address] [phone]  Please follow these instructions carefully (unless otherwise directed):  An IV will be required for this test and Nitroglycerin will be given.  Hold all erectile dysfunction medications at least 3 days (72 hrs) prior to test. (Ie viagra, cialis, sildenafil, tadalafil, etc)   On the Night Before the Test: Be sure to Drink plenty of water. Do not consume any caffeinated/decaffeinated beverages or chocolate 12 hours prior to your test. Do not take any antihistamines 12 hours prior to your test.  On the Day of the Test: Drink plenty of water until 1 hour prior to the test. Do not eat any food 1 hour prior to test. You may take your regular medications prior to the test.  Take metoprolol (Lopressor) two hours  prior to test. Patients who wear a continuous glucose monitor MUST remove the device prior to scanning.      After the  Test: Drink plenty of water. After receiving IV contrast, you may experience a mild flushed feeling. This is normal. On occasion, you may experience a mild rash up to 24 hours after the test. This is not dangerous. If this occurs, you can take Benadryl 25 mg, Zyrtec, Claritin, or Allegra and increase your fluid intake. (Patients taking Tikosyn should avoid Benadryl, and may take Zyrtec, Claritin, or Allegra) If you experience trouble breathing, this can be serious. If it is severe call 911 IMMEDIATELY. If it is mild, please call our office.  We will call to schedule your test 2-4 weeks out understanding that some insurance companies will need an authorization prior to the service being performed.   For more information and frequently asked questions, please visit our website : [email or web address]  For non-scheduling related questions, please contact the cardiac imaging nurse navigator should you have any questions/concerns: Cardiac Imaging Nurse Navigators Direct Office Dial: [phone]   For scheduling needs, including cancellations and rescheduling, please call [place], [phone].   Your physician has requested that you have an echocardiogram. Echocardiography is a painless test that uses sound waves to create images of your heart. It provides your doctor with information about the size and shape of your heart and how well your heart's chambers and valves are working.   You may receive an ultrasound enhancing agent through an IV if needed to better visualize your heart during the echo. This procedure takes approximately one hour.  There are no restrictions for this procedure.  This will take place at [address] [facility] [address]) [address], [place] [address]  Please note: We ask at that you not bring children with you during ultrasound (echo/ vascular) testing. Due to room size and safety concerns, children are not allowed in the ultrasound rooms during exams.  Our front office staff cannot provide observation of children in our lobby area while testing is being conducted. An adult accompanying a patient to their appointment will only be allowed in the ultrasound room at the discretion of the ultrasound technician under special circumstances. We apologize for any inconvenience.   Follow-Up: At [facility], you and your health needs are our priority.  As part of our continuing mission to provide you with exceptional heart care, our providers are all part of one team.  This team includes your primary Cardiologist (physician) and Advanced Practice Providers or APPs (Physician Assistants and Nurse Practitioners) who all work together to provide you with the care you need, when you need it.  Your next appointment:   2-3 month(s)  Provider:   Debbe Odea, MD    Signed, Debbe Odea, MD  06/15/2023 11:07 AM    [facility]

## 2023-06-15 NOTE — Patient Instructions (Signed)
 Medication Instructions:  Your physician recommends the following medication changes.  TAKE ONCE: Metoprolol 100 mg approximately 2 hours before your CTA  START TAKING: Aspirin 81 mg daily Pravastatin 20 mg daily  *If you need a refill on your cardiac medications before your next appointment, please call your pharmacy*  Lab Work: Your provider would like for you to have following labs drawn today BMeT.   If you have labs (blood work) drawn today and your tests are completely normal, you will receive your results only by: MyChart Message (if you have MyChart) OR A paper copy in the mail If you have any lab test that is abnormal or we need to change your treatment, we will call you to review the results.  Testing/Procedures: Your cardiac CT will be scheduled at one of the below locations:   [facility] [address] [place], [place] [address] [phone]  Please follow these instructions carefully (unless otherwise directed):  An IV will be required for this test and Nitroglycerin will be given.  Hold all erectile dysfunction medications at least 3 days (72 hrs) prior to test. (Ie viagra, cialis, sildenafil, tadalafil, etc)   On the Night Before the Test: Be sure to Drink plenty of water. Do not consume any caffeinated/decaffeinated beverages or chocolate 12 hours prior to your test. Do not take any antihistamines 12 hours prior to your test.  On the Day of the Test: Drink plenty of water until 1 hour prior to the test. Do not eat any food 1 hour prior to test. You may take your regular medications prior to the test.  Take metoprolol (Lopressor) two hours prior to test. Patients who wear a continuous glucose monitor MUST remove the device prior to scanning.      After the Test: Drink plenty of water. After receiving IV contrast, you may experience a mild flushed feeling. This is normal. On occasion, you may experience a mild  rash up to 24 hours after the test. This is not dangerous. If this occurs, you can take Benadryl 25 mg, Zyrtec, Claritin, or Allegra and increase your fluid intake. (Patients taking Tikosyn should avoid Benadryl, and may take Zyrtec, Claritin, or Allegra) If you experience trouble breathing, this can be serious. If it is severe call 911 IMMEDIATELY. If it is mild, please call our office.  We will call to schedule your test 2-4 weeks out understanding that some insurance companies will need an authorization prior to the service being performed.   For more information and frequently asked questions, please visit our website : [email or web address]  For non-scheduling related questions, please contact the cardiac imaging nurse navigator should you have any questions/concerns: Cardiac Imaging Nurse Navigators Direct Office Dial: [phone]   For scheduling needs, including cancellations and rescheduling, please call [place], [phone].   Your physician has requested that you have an echocardiogram. Echocardiography is a painless test that uses sound waves to create images of your heart. It provides your doctor with information about the size and shape of your heart and how well your heart's chambers and valves are working.   You may receive an ultrasound enhancing agent through an IV if needed to better visualize your heart during the echo. This procedure takes approximately one hour.  There are no restrictions for this procedure.  This will take place at [address] [facility] [address]) [address], [place] [address]  Please note: We ask at that you not bring children with you during ultrasound (  echo/ vascular) testing. Due to room size and safety concerns, children are not allowed in the ultrasound rooms during exams. Our front office staff cannot provide observation of children in our lobby area while testing is being conducted. An adult accompanying a patient to their  appointment will only be allowed in the ultrasound room at the discretion of the ultrasound technician under special circumstances. We apologize for any inconvenience.   Follow-Up: At [facility], you and your health needs are our priority.  As part of our continuing mission to provide you with exceptional heart care, our providers are all part of one team.  This team includes your primary Cardiologist (physician) and Advanced Practice Providers or APPs (Physician Assistants and Nurse Practitioners) who all work together to provide you with the care you need, when you need it.  Your next appointment:   2-3 month(s)  Provider:   Debbe Odea, MD

## 2023-06-16 LAB — BASIC METABOLIC PANEL WITH GFR
BUN/Creatinine Ratio: 18 (ref 10–24)
BUN: 16 mg/dL (ref 8–27)
CO2: 21 mmol/L (ref 20–29)
Calcium: 9.5 mg/dL (ref 8.6–10.2)
Chloride: 103 mmol/L (ref 96–106)
Creatinine, Ser: 0.89 mg/dL (ref 0.76–1.27)
Glucose: 86 mg/dL (ref 70–99)
Potassium: 4.6 mmol/L (ref 3.5–5.2)
Sodium: 143 mmol/L (ref 134–144)
eGFR: 97 mL/min/{1.73_m2} (ref >59)

## 2023-06-19 ENCOUNTER — Other Ambulatory Visit: Payer: Self-pay

## 2023-06-19 ENCOUNTER — Emergency Department

## 2023-06-19 ENCOUNTER — Emergency Department
Admission: EM | Admit: 2023-06-19 | Discharge: 2023-06-19 | Disposition: A | Discharge status: Home / Self Care | Attending: Emergency Medicine | Admitting: Emergency Medicine

## 2023-06-19 DIAGNOSIS — R109 Unspecified abdominal pain: Secondary | ICD-10-CM | POA: Diagnosis not present

## 2023-06-19 DIAGNOSIS — K7689 Other specified diseases of liver: Secondary | ICD-10-CM | POA: Diagnosis not present

## 2023-06-19 DIAGNOSIS — R1031 Right lower quadrant pain: Secondary | ICD-10-CM | POA: Diagnosis not present

## 2023-06-19 DIAGNOSIS — K5792 Diverticulitis of intestine, part unspecified, without perforation or abscess without bleeding: Secondary | ICD-10-CM | POA: Diagnosis not present

## 2023-06-19 LAB — COMPREHENSIVE METABOLIC PANEL WITH GFR
ALT: 23 U/L (ref 0–44)
AST: 18 U/L (ref 15–41)
Albumin: 3.5 g/dL (ref 3.5–5.0)
Alkaline Phosphatase: 74 U/L (ref 38–126)
Anion gap: 6 (ref 5–15)
BUN: 19 mg/dL (ref 8–23)
CO2: 27 mmol/L (ref 22–32)
Calcium: 8.7 mg/dL — ABNORMAL LOW (ref 8.9–10.3)
Chloride: 105 mmol/L (ref 98–111)
Creatinine, Ser: 0.84 mg/dL (ref 0.61–1.24)
GFR, Estimated: >60 mL/min (ref >60)
Glucose, Bld: 103 mg/dL — ABNORMAL HIGH (ref 70–99)
Potassium: 4.2 mmol/L (ref 3.5–5.1)
Sodium: 138 mmol/L (ref 135–145)
Total Bilirubin: 0.7 mg/dL (ref 0.0–1.2)
Total Protein: 6.6 g/dL (ref 6.5–8.1)

## 2023-06-19 LAB — CBC
HCT: 38.6 % — ABNORMAL LOW (ref 39.0–52.0)
Hemoglobin: 12.1 g/dL — ABNORMAL LOW (ref 13.0–17.0)
MCH: 27.1 pg (ref 26.0–34.0)
MCHC: 31.3 g/dL (ref 30.0–36.0)
MCV: 86.4 fL (ref 80.0–100.0)
Platelets: 261 10*3/uL (ref 150–400)
RBC: 4.47 MIL/uL (ref 4.22–5.81)
RDW: 16.4 % — ABNORMAL HIGH (ref 11.5–15.5)
WBC: 8 10*3/uL (ref 4.0–10.5)
nRBC: 0 % (ref 0.0–0.2)

## 2023-06-19 LAB — URINALYSIS, ROUTINE W REFLEX MICROSCOPIC
Bilirubin Urine: NEGATIVE
Glucose, UA: NEGATIVE mg/dL
Hgb urine dipstick: NEGATIVE
Ketones, ur: NEGATIVE mg/dL
Leukocytes,Ua: NEGATIVE
Nitrite: NEGATIVE
Protein, ur: NEGATIVE mg/dL
Specific Gravity, Urine: 1.018 (ref 1.005–1.030)
pH: 5 (ref 5.0–8.0)

## 2023-06-19 LAB — LIPASE, BLOOD: Lipase: 29 U/L (ref 11–51)

## 2023-06-19 MED ORDER — IOHEXOL 300 MG/ML  SOLN
75.0000 mL | Freq: Once | INTRAMUSCULAR | Status: AC | PRN
  Administered 2023-06-19: 75 mL via INTRAVENOUS

## 2023-06-19 MED ORDER — TRAMADOL HCL 50 MG PO TABS
50.0000 mg | ORAL_TABLET | Freq: Once | ORAL | Status: AC
  Administered 2023-06-19: 50 mg via ORAL
  Filled 2023-06-19: qty 1

## 2023-06-19 NOTE — ED Notes (Signed)
 Patient transported to CT

## 2023-06-19 NOTE — ED Notes (Signed)
Pt asking for pain meds. MD made aware.

## 2023-06-19 NOTE — ED Notes (Signed)
 This Rn attempted IV twice without success. Another RN sent in to try.

## 2023-06-19 NOTE — ED Triage Notes (Signed)
 Patient reports RLQ pain.  Reports had surgery 5 weeks ago due to perf diverticulitis.  Reports RLQ pain started Thursday.  Denies n/v/d or constipation.

## 2023-06-19 NOTE — Discharge Instructions (Addendum)
 Your blood work was normal today and the CT scan did not have findings to explain the pain. Continue to take pain medication as needed.  Schedule follow-up with your surgeon.  Return to the emergency department with worsening pain, development of a fever or multiple episodes of vomiting.  Results from your CT scan can be seen below: IMPRESSION:  1. Small fluid collection abutting the tail of the pancreas likely  representing an inflammatory collection and possibly developing  pseudocyst secondary to pancreatitis or an area of fat necrosis.  Correlation with pancreatic enzymes and attention on follow-up  imaging recommended. Alternatively this could be a developing  abscess secondary to spread of infected content related to prior  diverticular abscess.  2. Postsurgical changes of sigmoid resection. No bowel obstruction.  Normal appendix.

## 2023-06-19 NOTE — ED Provider Notes (Signed)
 [facility] Provider Note    Event Date/Time   First MD Initiated Contact with Patient 06/19/23 1503     (approximate)   History   Abdominal Pain   HPI  Marcus Andrews is a 62 y.o. 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 Patient denies fevers, N/V/D, urinary symptoms. Pain is intermittent. First noticed it after riding a stationary bike.       Physical Exam   Triage Vital Signs: ED Triage Vitals  Encounter Vitals Group     BP 06/19/23 1321 136/80     Systolic BP Percentile --      Diastolic BP Percentile --      Pulse Rate 06/19/23 1321 67     Resp 06/19/23 1321 16     Temp 06/19/23 1321 98.8 F (37.1 C)     Temp Source 06/19/23 1321 Oral     SpO2 06/19/23 1321 99 %     Weight 06/19/23 1321 175 lb (79.4 kg)     Height 06/19/23 1321 6' (1.829 m)     Head Circumference --      Peak Flow --      Pain Score 06/19/23 1320 7     Pain Loc --      Pain Education --      Exclude from Growth Chart --     Most recent vital signs: Vitals:   06/19/23 1321 06/19/23 1719  BP: 136/80 134/83  Pulse: 67 70  Resp: 16 16  Temp: 98.8 F (37.1 C) 98.2 F (36.8 C)  SpO2: 99% 98%   General: Awake, no distress.  CV:  Good peripheral perfusion.  RRR. Resp:  Normal effort.  CTAB. Abd:  No distention.  Soft, tender to palpation in the lower abdomen but specifically in the right lower quadrant, well-healing surgical scars with no surrounding erythema.    ED Results / Procedures / Treatments   Labs (all labs ordered are listed, but only abnormal results are displayed) Labs Reviewed  COMPREHENSIVE METABOLIC PANEL WITH GFR - Abnormal; Notable for the following components:      Result Value   Glucose, Bld 103 (*)    Calcium 8.7 (*)    All other components within normal limits  CBC - Abnormal; Notable for the following components:   Hemoglobin 12.1 (*)     HCT 38.6 (*)    RDW 16.4 (*)    All other components within normal limits  URINALYSIS, ROUTINE W REFLEX MICROSCOPIC - Abnormal; Notable for the following components:   Color, Urine YELLOW (*)    APPearance HAZY (*)    All other components within normal limits  LIPASE, BLOOD    RADIOLOGY  CT abdomen pelvis obtained, interpreted the images as well as reviewed the radiologist report.  Did show small fluid collection at the tail of the pancreas likely representing an inflammatory collection and possibly developing pseudocyst secondary to pancreatitis or an area of fat necrosis.  Alternatively radiologist thought it could be a developing abscess secondary to spread of infected content related to prior diverticular abscess.  There is postsurgical changes of sigmoid resection with no bowel obstruction.  Appendix is normal.  PROCEDURES:  Critical Care performed: No  Procedures   MEDICATIONS ORDERED IN ED: Medications  iohexol (OMNIPAQUE) 300 MG/ML solution 75 mL (75 mLs Intravenous Contrast Given 06/19/23 1701)  traMADol (ULTRAM) tablet 50 mg (50 mg Oral Given  06/19/23 1718)     IMPRESSION / MDM / ASSESSMENT AND PLAN / ED COURSE  I reviewed the triage vital signs and the nursing notes.                             63 year old male presents for evaluation of right lower quadrant pain with history of recent abdominal surgery.  Vital signs are stable patient NAD on exam.  Differential diagnosis includes, but is not limited to, appendicitis, hernia, UTI, nephrolithiasis, bowel obstruction, hernia.  Patient's presentation is most consistent with acute complicated illness / injury requiring diagnostic workup.  CMP unremarkable.  Lipase WNL.  CBC shows mild anemia.  Urinalysis is normal.  Patient does have tenderness in the lower abdomen with the worst being in the right lower quadrant.  Given his history of recent abdominal surgery we will proceed with CT scan although blood work is  reassuring.  No overlying skin changes concerned for a strangulated or incarcerated hernia.   CT scan showed a fluid collection at the tail of the pancreas.  I spoke with the on-call surgeon regarding this.  Radiologist was concerned about developing secondary abscess which I feel is unlikely given patient is afebrile and white count is not elevated.  Surgery was in agreement.  They did not feel that antibiotics are indicated at this time and recommended that patient follow-up with the surgeon who did his colectomy.  I reviewed results with the patient and gave him reassurance.  Patient states that it is time for him to schedule a follow-up appointment with his surgeon anyway.  We reviewed return precautions including development of fever, inability to tolerate p.o. or worsening pain.  Patient voiced understanding, all questions were answered and he was stable at discharge.      FINAL CLINICAL IMPRESSION(S) / ED DIAGNOSES   Final diagnoses:  RLQ abdominal pain     Rx / DC Orders   ED Discharge Orders     None        Note:  This document was prepared using Dragon voice recognition software and may include unintentional dictation errors.   Marcus Ali, [name] 06/19/23 1756    Marcus Lima, MD 06/20/23 1505

## 2023-06-19 NOTE — ED Notes (Signed)
 Rn to bedside to introduce self to pt. Pt ambulatory to room from lobby.

## 2023-06-22 ENCOUNTER — Encounter (HOSPITAL_COMMUNITY): Payer: Self-pay

## 2023-06-22 ENCOUNTER — Ambulatory Visit: Admitting: Family

## 2023-06-22 ENCOUNTER — Encounter: Payer: Self-pay | Admitting: Family

## 2023-06-22 VITALS — BP 122/78 | HR 90 | Temp 98.5°F | Ht 72.0 in | Wt 176.4 lb

## 2023-06-22 DIAGNOSIS — K5720 Diverticulitis of large intestine with perforation and abscess without bleeding: Secondary | ICD-10-CM

## 2023-06-22 DIAGNOSIS — R1031 Right lower quadrant pain: Secondary | ICD-10-CM | POA: Diagnosis not present

## 2023-06-22 MED ORDER — TRAMADOL HCL 50 MG PO TABS: 50.0000 mg | ORAL_TABLET | Freq: Two times a day (BID) | ORAL | Status: AC | PRN

## 2023-06-22 NOTE — Progress Notes (Signed)
 Established Patient Office Visit  Subjective:      CC:  Chief Complaint  Patient presents with   ER Follow Up    HPI: Marcus Andrews is a 63 y.o. male presenting on 06/22/2023 for ER Follow Up  Went to ER 4/5 for RLQ abd pain.  CMP lipase cbc urinalysis not concerning, cbc mild anemia. CT fluid collection around pancreas otherwise unremarkable, they consulted radiology who recommended for to f/u with surgeon who completed the colectomy and monitor for fever. Cbc without leukocytosis. He was able to discuss with the surgeon who advised pt this was a hematoma, and it will take time to rectify.   He does state that he still has concerns with right lower abdominal swelling. He was concerned for a hernia however not seen on Ct. He states prior to this his normal routine is walking, biking, and exercising often. He states while he was riding a bike six days ago his back was hurting him and then he felt some pain in his right lower abdomen and has since had that pain.  Three days after went for a walk and the symptoms worsened. Went for a walk yesterday, no issues. He feels it very slightly today, thinks it is getting slightly better. When he stands up he can feel the pain he describes the pain as a dull aching pain and it will subside in a few minutes. No urinary symptoms. No change in bowel movements.   Normal appendix on the Ct.        Social history:  Relevant past medical, surgical, family and social history reviewed and updated as indicated. Interim medical history since our last visit reviewed.  Allergies and medications reviewed and updated.  DATA REVIEWED: CHART IN EPIC     ROS: Negative unless specifically indicated above in HPI.    Current Outpatient Medications:    acetaminophen (TYLENOL) 325 MG tablet, Take 2 tablets (650 mg total) by mouth every 6 (six) hours as needed for mild pain, fever or headache., Disp: , Rfl:    aspirin EC 81 MG tablet, Take 1 tablet (81  mg total) by mouth daily. Swallow whole., Disp: , Rfl:    metoprolol tartrate (LOPRESSOR) 50 MG tablet, TAKE BOTH TABLETS 2 HR PRIOR TO CARDIAC PROCEDURE, Disp: 2 tablet, Rfl: 0   omeprazole (PRILOSEC) 40 MG capsule, TAKE 1 CAPSULE (40 MG TOTAL) BY MOUTH DAILY., Disp: 30 capsule, Rfl: 8   pravastatin (PRAVACHOL) 20 MG tablet, Take 1 tablet (20 mg total) by mouth every evening., Disp: 90 tablet, Rfl: 3   traMADol (ULTRAM) 50 MG tablet, Take 1 tablet (50 mg total) by mouth every 12 (twelve) hours as needed., Disp: , Rfl:       Objective:    BP 122/78 (BP Location: Left Arm, Patient Position: Sitting, Cuff Size: Normal)   Pulse 90   Temp 98.5 F (36.9 C) (Temporal)   Ht 6' (1.829 m)   Wt 176 lb 6.4 oz (80 kg)   SpO2 96%   BMI 23.92 kg/m   Wt Readings from Last 3 Encounters:  06/22/23 176 lb 6.4 oz (80 kg)  06/19/23 175 lb (79.4 kg)  06/15/23 175 lb 6.4 oz (79.6 kg)    Physical Exam Constitutional:      General: He is not in acute distress.    Appearance: Normal appearance. He is normal weight. He is not ill-appearing, toxic-appearing or diaphoretic.  Cardiovascular:     Rate and Rhythm: Normal rate.  Pulmonary:  Effort: Pulmonary effort is normal.  Abdominal:     Hernia: A hernia is present. Hernia is present in the right inguinal area (reducible but painful upon reduction).  Genitourinary:    Testes:        Right: Mass present.  Musculoskeletal:        General: Normal range of motion.  Neurological:     General: No focal deficit present.     Mental Status: He is alert and oriented to person, place, and time. Mental status is at baseline.  Psychiatric:        Mood and Affect: Mood normal.        Behavior: Behavior normal.        Thought Content: Thought content normal.        Judgment: Judgment normal.           Assessment & Plan:  Right inguinal pain Assessment & Plan: Palpated and reduced on exam in office today however painful upon reduction. Advised pt  red flag ER precautions. Advised pt to see general surgeon , he already has one, Dr. Karie Soda with [facility], who he will reach out to for assessment. Avoid straining, stool softener if needed.     Colonic diverticular abscess -     traMADol HCl; Take 1 tablet (50 mg total) by mouth every 12 (twelve) hours as needed.     Return if symptoms worsen or fail to improve.  Marcus Sawyers, MSN, APRN, FNP-C [facility]

## 2023-06-22 NOTE — Assessment & Plan Note (Signed)
 Palpated and reduced on exam in office today however painful upon reduction. Advised pt red flag ER precautions. Advised pt to see general surgeon , he already has one, Dr. Karie Soda with [facility], who he will reach out to for assessment. Avoid straining, stool softener if needed.

## 2023-06-22 NOTE — Patient Instructions (Signed)
  Suspect inguinal hernia, reducible in office however still with intermittent pain.  Ask general surgeon if he could consult with the patient in office.

## 2023-06-23 ENCOUNTER — Telehealth (HOSPITAL_COMMUNITY): Payer: Self-pay | Admitting: *Deleted

## 2023-06-23 NOTE — Telephone Encounter (Signed)
 Reaching out to patient to offer assistance regarding upcoming cardiac imaging study; pt verbalizes understanding of appt date/time, parking situation and where to check in, pre-test NPO status and medications ordered, and verified current allergies; name and call back number provided for further questions should they arise Marcus Frame RN Navigator Cardiac Imaging [facility] [phone] office [phone] cell

## 2023-06-24 ENCOUNTER — Ambulatory Visit
Admission: RE | Admit: 2023-06-24 | Discharge: 2023-06-24 | Disposition: A | Discharge status: Home / Self Care | Source: Ambulatory Visit | Attending: Cardiology | Admitting: Cardiology

## 2023-06-24 DIAGNOSIS — R9430 Abnormal result of cardiovascular function study, unspecified: Secondary | ICD-10-CM

## 2023-06-24 DIAGNOSIS — I2510 Atherosclerotic heart disease of native coronary artery without angina pectoris: Secondary | ICD-10-CM | POA: Diagnosis not present

## 2023-06-24 MED ORDER — SODIUM CHLORIDE 0.9 % IV SOLN: INTRAVENOUS | Status: DC

## 2023-06-24 MED ORDER — NITROGLYCERIN 0.4 MG SL SUBL
0.8000 mg | SUBLINGUAL_TABLET | Freq: Once | SUBLINGUAL | Status: AC
  Administered 2023-06-24: 0.8 mg via SUBLINGUAL

## 2023-06-24 MED ORDER — IOHEXOL 350 MG/ML SOLN
75.0000 mL | Freq: Once | INTRAVENOUS | Status: AC | PRN
  Administered 2023-06-24: 75 mL via INTRAVENOUS

## 2023-06-25 ENCOUNTER — Encounter: Payer: Self-pay | Admitting: Family

## 2023-06-25 DIAGNOSIS — R1031 Right lower quadrant pain: Secondary | ICD-10-CM

## 2023-06-25 DIAGNOSIS — K4030 Unilateral inguinal hernia, with obstruction, without gangrene, not specified as recurrent: Secondary | ICD-10-CM

## 2023-06-28 NOTE — Telephone Encounter (Signed)
 Called and spoke with gen surgeon's office for Dr. Hershell Lose with [facility].  They are unable to see pt until 4/28 pt is in a lot of pain, worse with palpation and movement in his right inguinal area. With significant bulge. No fever slight nausea no vomiting. He states pain is constant. They are unable to get him in any sooner he is on cancellation list.   Pt hesitant to go to ER, he went prior and they did not evaluate for the hernia so was a bit frustrating.   Will attempt to place referral to another surgeon with [facility] as I would like to r/o obstruction/ and consider eval for surgery

## 2023-06-28 NOTE — Addendum Note (Signed)
 Addended by: Felicita Horns on: 06/28/2023 01:44 PM   Modules accepted: Orders

## 2023-06-29 ENCOUNTER — Encounter: Payer: Self-pay | Admitting: General Surgery

## 2023-06-29 ENCOUNTER — Telehealth: Payer: Self-pay | Admitting: General Surgery

## 2023-06-29 ENCOUNTER — Ambulatory Visit: Payer: Self-pay | Admitting: General Surgery

## 2023-06-29 ENCOUNTER — Ambulatory Visit (INDEPENDENT_AMBULATORY_CARE_PROVIDER_SITE_OTHER): Payer: Self-pay | Admitting: General Surgery

## 2023-06-29 VITALS — BP 118/80 | HR 74 | Temp 98.6°F | Ht 72.0 in | Wt 177.0 lb

## 2023-06-29 DIAGNOSIS — K4090 Unilateral inguinal hernia, without obstruction or gangrene, not specified as recurrent: Secondary | ICD-10-CM

## 2023-06-29 DIAGNOSIS — R1031 Right lower quadrant pain: Secondary | ICD-10-CM

## 2023-06-29 NOTE — Patient Instructions (Signed)
 You have chose to have your hernia repaired. This will be done by Dr. Cornel Diesel at [facility].  If you are on any injectable weight loss medication, you will need to stop taking your GLP-1 injectable (weight loss) medications 8 days before your surgery to avoid any complications with anesthesia.   Please see your (blue) Pre-care information that you have been given today. Our surgery scheduler will call you to verify surgery date and to go over information.   You will need to arrange to be out of work for approximately 1-2 weeks and then you may return with a lifting restriction for 4 more weeks. If you have FMLA or Disability paperwork that needs to be filled out, please have your company fax your paperwork to [phone] or you may drop this by either office. This paperwork will be filled out within 3 days after your surgery has been completed.  You may have a bruise in your groin and also swelling and brusing in your testicle area. You may use ice 4-5 times daily for 15-20 minutes each time. Make sure that you place a barrier between you and the ice pack. To decrease the swelling, you may roll up a bath towel and place it vertically in between your thighs with your testicles resting on the towel. You will want to keep this area elevated as much as possible for several days following surgery.    Inguinal Hernia, Adult Muscles help keep everything in the body in its proper place. But if a weak spot in the muscles develops, something can poke through. That is called a hernia. When this happens in the lower part of the belly (abdomen), it is called an inguinal hernia. (It takes its name from a part of the body in this region called the inguinal canal.) A weak spot in the wall of muscles lets some fat or part of the small intestine bulge through. An inguinal hernia can develop at any age. Men get them more often than women. CAUSES  In adults, an inguinal hernia develops over time. It can be  triggered by: Suddenly straining the muscles of the lower abdomen. Lifting heavy objects. Straining to have a bowel movement. Difficult bowel movements (constipation) can lead to this. Constant coughing. This may be caused by smoking or lung disease. Being overweight. Being pregnant. Working at a job that requires long periods of standing or heavy lifting. Having had an inguinal hernia before. One type can be an emergency situation. It is called a strangulated inguinal hernia. It develops if part of the small intestine slips through the weak spot and cannot get back into the abdomen. The blood supply can be cut off. If that happens, part of the intestine may die. This situation requires emergency surgery. SYMPTOMS  Often, a small inguinal hernia has no symptoms. It is found when a healthcare provider does a physical exam. Larger hernias usually have symptoms.  In adults, symptoms may include: A lump in the groin. This is easier to see when the person is standing. It might disappear when lying down. In men, a lump in the scrotum. Pain or burning in the groin. This occurs especially when lifting, straining or coughing. A dull ache or feeling of pressure in the groin. Signs of a strangulated hernia can include: A bulge in the groin that becomes very painful and tender to the touch. A bulge that turns red or purple. Fever, nausea and vomiting. Inability to have a bowel movement or to pass gas.  DIAGNOSIS  To decide if you have an inguinal hernia, a healthcare provider will probably do a physical examination. This will include asking questions about any symptoms you have noticed. The healthcare provider might feel the groin area and ask you to cough. If an inguinal hernia is felt, the healthcare provider may try to slide it back into the abdomen. Usually no other tests are needed. TREATMENT  Treatments can vary. The size of the hernia makes a difference. Options include: Watchful waiting. This  is often suggested if the hernia is small and you have had no symptoms. No medical procedure will be done unless symptoms develop. You will need to watch closely for symptoms. If any occur, contact your healthcare provider right away. Surgery. This is used if the hernia is larger or you have symptoms. Open surgery. This is usually an outpatient procedure (you will not stay overnight in a hospital). An cut (incision) is made through the skin in the groin. The hernia is put back inside the abdomen. The weak area in the muscles is then repaired by herniorrhaphy or hernioplasty. Herniorrhaphy: in this type of surgery, the weak muscles are sewn back together. Hernioplasty: a patch or mesh is used to close the weak area in the abdominal wall. Laparoscopy. In this procedure, a surgeon makes small incisions. A thin tube with a tiny video camera (called a laparoscope) is put into the abdomen. The surgeon repairs the hernia with mesh by looking with the video camera and using two long instruments. HOME CARE INSTRUCTIONS  After surgery to repair an inguinal hernia: You will need to take pain medicine prescribed by your healthcare provider. Follow all directions carefully. You will need to take care of the wound from the incision. Your activity will be restricted for awhile. This will probably include no heavy lifting for several weeks. You also should not do anything too active for a few weeks. When you can return to work will depend on the type of job that you have. During "watchful waiting" periods, you should: Maintain a healthy weight. Eat a diet high in fiber (fruits, vegetables and whole grains). Drink plenty of fluids to avoid constipation. This means drinking enough water and other liquids to keep your urine clear or pale yellow. Do not lift heavy objects. Do not stand for long periods of time. Quit smoking. This should keep you from developing a frequent cough. SEEK MEDICAL CARE IF:  A bulge  develops in your groin area. You feel pain, a burning sensation or pressure in the groin. This might be worse if you are lifting or straining. You develop a fever of more than 100.5 F (38.1 C). SEEK IMMEDIATE MEDICAL CARE IF:  Pain in the groin increases suddenly. A bulge in the groin gets bigger suddenly and does not go down. For men, there is sudden pain in the scrotum. Or, the size of the scrotum increases. A bulge in the groin area becomes red or purple and is painful to touch. You have nausea or vomiting that does not go away. You feel your heart beating much faster than normal. You cannot have a bowel movement or pass gas. You develop a fever of more than 102.0 F (38.9 C).   This information is not intended to replace advice given to you by your health care provider. Make sure you discuss any questions you have with your health care provider.   Document Released: 07/19/2008 Document Revised: 05/25/2011 Document Reviewed: 09/03/2014 Elsevier Interactive Patient Education [employer]

## 2023-06-29 NOTE — Telephone Encounter (Signed)
 Patient has been advised of Pre-Admission date/time, and Surgery date at [facility].  Surgery Date: 07/19/23 Preadmission Testing Date: 07/13/23 (phone 1p-4p)  Patient informed of the scheduling process and surgery information given at time of visit.     Patient has been made aware to call [phone], between 1-3:00pm the day before surgery, to find out what time to arrive for surgery.

## 2023-06-29 NOTE — Telephone Encounter (Signed)
 Patient called, has surgery with Dr. Cornel Diesel on 07/19/23 for inguinal hernia.  Is wondering if can have a prescription for Ibuprofen called in as he says the OTC is not as effective.  Patient uses [facility] on [address] in [place], [place].  Please call to let patient know.  Thank you.

## 2023-07-01 ENCOUNTER — Telehealth: Payer: Self-pay

## 2023-07-01 NOTE — Telephone Encounter (Addendum)
 [facility] R "Charlann Confer" (Patient)   Subject  Krishav, Mamone "Charlann Confer" (Patient)   Topic  General - Other    Communication  Reason for CRM: Patient called in stating he would like to speak to NP Tabitha regarding his hernia, patient stated he would like to speak with her before she leaves today    Called patient wanted to know how to tell when his hernia is arrested. He also wanted to know what he could take for pain at this time he is taking 600mg  of ibuprofen and 650mg  of tylenol together first thing in the morning. Patient ok if my chart message is sent back and not phone call.

## 2023-07-01 NOTE — Telephone Encounter (Signed)
 Copied from CRM [id number]. Topic: General - Other >> Jul 01, 2023 11:39 AM Albertha Alosa wrote: Reason for CRM: Patient called in stating he would like to speak to NP Tabitha regarding his hernia, patient stated he would like to speak with her before she leaves today   [phone]

## 2023-07-05 ENCOUNTER — Ambulatory Visit: Admitting: Dietician

## 2023-07-06 ENCOUNTER — Ambulatory Visit: Payer: Self-pay | Admitting: Surgery

## 2023-07-06 ENCOUNTER — Telehealth: Payer: Self-pay | Admitting: General Surgery

## 2023-07-06 DIAGNOSIS — K4090 Unilateral inguinal hernia, without obstruction or gangrene, not specified as recurrent: Secondary | ICD-10-CM | POA: Diagnosis not present

## 2023-07-06 DIAGNOSIS — Z8719 Personal history of other diseases of the digestive system: Secondary | ICD-10-CM | POA: Diagnosis not present

## 2023-07-06 DIAGNOSIS — M545 Low back pain, unspecified: Secondary | ICD-10-CM | POA: Diagnosis not present

## 2023-07-06 DIAGNOSIS — G8929 Other chronic pain: Secondary | ICD-10-CM | POA: Diagnosis not present

## 2023-07-06 NOTE — Telephone Encounter (Signed)
 Patient called to cancel surgery for 07/19/23 for inguinal hernia repair.  Does not wish to reschedule. Said his original Careers adviser is now available and will be going back there.

## 2023-07-13 ENCOUNTER — Other Ambulatory Visit

## 2023-07-13 ENCOUNTER — Ambulatory Visit: Attending: Cardiology

## 2023-07-13 DIAGNOSIS — I2510 Atherosclerotic heart disease of native coronary artery without angina pectoris: Secondary | ICD-10-CM

## 2023-07-13 DIAGNOSIS — I361 Nonrheumatic tricuspid (valve) insufficiency: Secondary | ICD-10-CM | POA: Diagnosis not present

## 2023-07-13 DIAGNOSIS — I5030 Unspecified diastolic (congestive) heart failure: Secondary | ICD-10-CM | POA: Diagnosis not present

## 2023-07-13 LAB — ECHOCARDIOGRAM COMPLETE
AR max vel: 2.67 cm2
AV Area VTI: 2.73 cm2
AV Area mean vel: 2.67 cm2
AV Mean grad: 3 mmHg
AV Peak grad: 5 mmHg
Ao pk vel: 1.12 m/s
Area-P 1/2: 4.15 cm2
S' Lateral: 2.97 cm

## 2023-07-14 ENCOUNTER — Other Ambulatory Visit (HOSPITAL_COMMUNITY): Payer: Self-pay

## 2023-07-15 NOTE — Progress Notes (Signed)
 Patient ID: Marcus Andrews, male   DOB: [birth year], 62 y.o.   MRN: [id number] CC: Right Inguinal Hernia History of Present Illness THESEUS Andrews is a 63 y.o. male with past medical history significant for diverticulitis status post sigmoid colectomy who presents to consultation for right inguinal hernia.  The patient reports that he recently had his sigmoid out for diverticulitis and since then he has noticed pain in his right groin.  He says the pain radiates down to his testicle.  He said he sometimes feels a bulge.  He said the pain is quite severe at points and is worse when he exerts himself.  He denies any obstructive symptoms.  He denies any previous hernia repair..  Past Medical History Past Medical History:  Diagnosis Date   Arthritis    Colon polyp    Diverticulosis    Pre-diabetes    Refusal of blood transfusions as patient is Jehovah's Witness        Past Surgical History:  Procedure Laterality Date   COLONOSCOPY     FLEXIBLE SIGMOIDOSCOPY N/A 05/12/2023   Procedure: FLEXIBLE SIGMOIDOSCOPY;  Surgeon: Marcus Champagne, MD;  Location: [facility];  Service: General;  Laterality: N/A;   IR RADIOLOGIST EVAL & MGMT  03/30/2023   KNEE ARTHROSCOPY WITH MEDIAL MENISECTOMY Right 02/05/2020   Procedure: Right knee arthroscopy with partial medial and lateral menisectomy, chondroplasty, partial synovectomy, removal loose body;  Surgeon: Marcus Moons, MD;  Location: [facility] ORS;  Service: Orthopedics;  Laterality: Right;   MENISCUS REPAIR Left 01/2014   TOOTH EXTRACTION     UPPER GASTROINTESTINAL ENDOSCOPY  05/29/2021    No Known Allergies  Current Outpatient Medications  Medication Sig Dispense Refill   acetaminophen  (TYLENOL ) 325 MG tablet Take 2 tablets (650 mg total) by mouth every 6 (six) hours as needed for mild pain, fever or headache.     aspirin  EC 81 MG tablet Take 1 tablet (81 mg total) by mouth daily. Swallow whole.     metoprolol  tartrate (LOPRESSOR ) 50 MG tablet TAKE BOTH  TABLETS 2 HR PRIOR TO CARDIAC PROCEDURE 2 tablet 0   omeprazole  (PRILOSEC) 40 MG capsule TAKE 1 CAPSULE (40 MG TOTAL) BY MOUTH DAILY. 30 capsule 8   pravastatin  (PRAVACHOL ) 20 MG tablet Take 1 tablet (20 mg total) by mouth every evening. 90 tablet 3   traMADol  (ULTRAM ) 50 MG tablet Take 1 tablet (50 mg total) by mouth every 12 (twelve) hours as needed.     No current facility-administered medications for this visit.    Family History Family History  Problem Relation Age of Onset   Breast cancer Mother    Colon cancer Mother 93   Colon polyps Mother    Lymphoma Mother    Heart disease Father    Heart failure Father    Heart attack Father 87   Hypertension Sister    Hyperlipidemia Sister    Asthma Sister    Aneurysm Sister    Hypertension Brother    Hyperlipidemia Brother    Deep vein thrombosis Brother    Heart attack Brother        50-60   Hypertension Maternal Grandmother    Diabetes Maternal Grandmother    Hypertension Maternal Aunt    Cancer Maternal Aunt        type unknown   Hypertension Maternal Uncle    Liver cancer Maternal Uncle    Hypertension Paternal Aunt    Hypertension Paternal Uncle    Esophageal cancer Neg Hx  Rectal cancer Neg Hx    Stomach cancer Neg Hx        Social History Social History   Tobacco Use   Smoking status: Former    Current packs/day: 0.00    Types: Cigarettes    Quit date: 05/28/2018    Years since quitting: 5.1   Smokeless tobacco: Never  Vaping Use   Vaping status: Never Used  Substance Use Topics   Alcohol use: Not Currently    Alcohol/week: 1.0 standard drink of alcohol    Types: 1 Cans of beer per week   Drug use: Never        ROS Full ROS of systems performed and is otherwise negative there than what is stated in the HPI  Physical Exam Blood pressure 118/80, pulse 74, temperature 98.6 F (37 C), temperature source Oral, height 6' (1.829 m), weight 177 lb (80.3 kg), SpO2 98%.  Alert and oriented x 3, normal  breathing on room air, regular rate and rhythm, abdomen soft, nontender nondistended, robotic scars healing well.  Right groin with pain upon palpation and reducible hernia..  Data Reviewed I reviewed his operative notes from his other surgeon about his sigmoid colectomy.  I also reviewed his CT scan that showed a right inguinal hernia.  I have personally reviewed the patient's imaging and medical records.    Assessment  Patient with right inguinal hernia.   Plan    I discussed with the patient that we could perform a repair of this.  I discussed risk, benefits alternatives of inguinal hernia repair including risk of infection, bleeding, damage to the vas deferens, testicular ischemia and recurrence.  I also discussed with him that if he would like to see his surgeon that did his sigmoid then he can certainly see him and if he feels more comfortable following up with him and that is fine 2.  We will tentatively schedule him for surgery today but again he may need to cancel if he prefers care with his other surgeon.  He has an appoint with him in several weeks.    Marcus Andrews

## 2023-07-19 ENCOUNTER — Ambulatory Visit: Admit: 2023-07-19 | Admitting: General Surgery

## 2023-07-19 SURGERY — HERNIORRHAPHY, INGUINAL, ROBOT-ASSISTED, LAPAROSCOPIC
Anesthesia: General | Site: Inguinal | Laterality: Right

## 2023-07-26 NOTE — Progress Notes (Deleted)
  05/24/2023  Primary concerns today: eat properly and regain unintentional weight lost  Referral diagnosis: Dyslipidemia, Prediabetes, Diverticulitis of colon with perforation, Weight loss    Lab Results  Component Value Date   HGBA1C 5.7 (H) 04/29/2023   Wt Readings from Last 3 Encounters:  06/29/23 177 lb (80.3 kg)  06/22/23 176 lb 6.4 oz (80 kg)  06/19/23 175 lb (79.4 kg)    Lifestyle & Dietary Hx Pt present today alone. Pt reports he lives and alone and does the cooking and shopping. Pt reports s/p surgery 05/12/2023 for bowel perforation r/t diverticulitis. Pt reports he desires to eat properly and regain weight lost stating aiming for 180-185#. Pt reports he was instructed to follow a high fiber diet and would like support. Pt reports he will be out of work for the next 6 weeks. Pt reports she is allergic to guava and passion fruit and avoids these successfully.  Pt reports his intake varies based on on his sleep schedule. Pt reports 2-3 meals with 1-3 snacks daily. Pt reports he sometimes does not feel hungry and will not eat. Pt reports avoid pork and beef. Pt reports he uses nutrition labels to determine sugar and salt intake. All Pt's questions were answered during this encounter    Estimated daily fluid intake: "1 gallon water " ~128 oz Supplements: none Sleep: sleep could improve; Pt reports upcoming sleep study  Stress / self-care: 4 out fo 10/ self care includes reading, prayer Current average weekly physical activity: walking 1 hour total daily  24-Hr Dietary Recall First Meal: eggs, grits, or eggs, pancakes, coffee with half and half and sugar  Snack: none or boost/ensure Second Meal: 12-2 pm: chicken burger french fries or skips Snack: pretzels or cookies or cakes Third Meal: [employer] baked beans, french fries, chicken tenders, [place]  toast, cherry coke Snack: cookies and ice cream  Beverages: water , coke, gingerale, sweet tea   Handouts Provided Include  Heart  Healthy Nutrition label Plate planner with healthy food list Snack List  Goals Established by Pt Avoid carbohydrate containing beverages Use Nutrition labels to select heart healthy food items

## 2023-08-02 ENCOUNTER — Ambulatory Visit: Admitting: Dietician

## 2023-08-02 DIAGNOSIS — R634 Abnormal weight loss: Secondary | ICD-10-CM

## 2023-08-02 DIAGNOSIS — R7303 Prediabetes: Secondary | ICD-10-CM

## 2023-08-02 DIAGNOSIS — K5720 Diverticulitis of large intestine with perforation and abscess without bleeding: Secondary | ICD-10-CM

## 2023-08-02 DIAGNOSIS — E785 Hyperlipidemia, unspecified: Secondary | ICD-10-CM

## 2023-08-16 ENCOUNTER — Ambulatory Visit: Attending: Cardiology | Admitting: Cardiology

## 2023-08-16 ENCOUNTER — Encounter: Payer: Self-pay | Admitting: Cardiology

## 2023-08-16 VITALS — BP 140/80 | HR 65 | Ht 72.0 in | Wt 184.5 lb

## 2023-08-16 DIAGNOSIS — I2510 Atherosclerotic heart disease of native coronary artery without angina pectoris: Secondary | ICD-10-CM | POA: Diagnosis not present

## 2023-08-16 DIAGNOSIS — Z01810 Encounter for preprocedural cardiovascular examination: Secondary | ICD-10-CM

## 2023-08-16 DIAGNOSIS — R030 Elevated blood-pressure reading, without diagnosis of hypertension: Secondary | ICD-10-CM

## 2023-08-16 DIAGNOSIS — E7800 Pure hypercholesterolemia, unspecified: Secondary | ICD-10-CM

## 2023-08-16 NOTE — Patient Instructions (Signed)
 Medication Instructions:   Your physician recommends that you continue on your current medications as directed. Please refer to the Current Medication list given to you today.   *If you need a refill on your cardiac medications before your next appointment, please call your pharmacy*  Lab Work:  No labs ordered today   If you have labs (blood work) drawn today and your tests are completely normal, you will receive your results only by: MyChart Message (if you have MyChart) OR A paper copy in the mail If you have any lab test that is abnormal or we need to change your treatment, we will call you to review the results.  Testing/Procedures:  No test ordered today   Follow-Up:  At [facility], you and your health needs are our priority.  As part of our continuing mission to provide you with exceptional heart care, our providers are all part of one team.  This team includes your primary Cardiologist (physician) and Advanced Practice Providers or APPs (Physician Assistants and Nurse Practitioners) who all work together to provide you with the care you need, when you need it.  Your next appointment:    12 month(s)  Provider:   You may see [place] or one of the following Advanced Practice Providers on your designated Care Team:   Laneta Pintos, NP Gildardo Labrador, [name] Varney Gentleman, [name] Cadence Yellow [place], [name] Ronald Cockayne, NP Morey Ar, NP   We recommend signing up for the patient portal called "MyChart".  Sign up information is provided on this After Visit Summary.  MyChart is used to connect with patients for Virtual Visits (Telemedicine).  Patients are able to view lab/test results, encounter notes, upcoming appointments, etc.  Non-urgent messages can be sent to your provider as well.   To learn more about what you can do with MyChart, go to [email or web address].

## 2023-08-16 NOTE — Progress Notes (Signed)
 Cardiology Office Note:    Date:  08/16/2023   ID:  Marcus Andrews, DOB 07-17-1960, MRN [id number]  PCP:  Felicita Horns, FNP   [facility] Providers Cardiologist:  None     Referring MD: Felicita Horns, FNP   Chief Complaint  Patient presents with   2 month follow up     "Doing well."  Patient is going for hernia surgery on this Friday, August 20, 2023.     History of Present Illness:    Marcus Andrews is a 63 y.o. male with a hx of RCA calcifications, former smoker x 35+ years, diverticulitis who presents for preop eval and follow-up.  Previously seen due to aortic and RCA calcifications.  Has a family history of early CAD in father and 2 older brothers.  Echocardiogram and coronary CTA obtained to evaluate significant cardiac abnormalities.  History of muscle cramps with Lipitor, switch to Pravachol  with good effect.  Denies muscle aches with taking Pravachol .  Echo 06/2023 EF 50 to 55%.  Coronary CTA 06/2023 minimal RCA stenosis/calcification less than 25%.  Planning on obtaining hernia surgery in 4 days.  Otherwise doing okay, no new concerns at this time.    Past Medical History:  Diagnosis Date   Arthritis    Colon polyp    Diverticulosis    Pre-diabetes    Refusal of blood transfusions as patient is Jehovah's Witness     Past Surgical History:  Procedure Laterality Date   COLONOSCOPY     FLEXIBLE SIGMOIDOSCOPY N/A 05/12/2023   Procedure: FLEXIBLE SIGMOIDOSCOPY;  Surgeon: Candyce Champagne, MD;  Location: [facility];  Service: General;  Laterality: N/A;   IR RADIOLOGIST EVAL & MGMT  03/30/2023   KNEE ARTHROSCOPY WITH MEDIAL MENISECTOMY Right 02/05/2020   Procedure: Right knee arthroscopy with partial medial and lateral menisectomy, chondroplasty, partial synovectomy, removal loose body;  Surgeon: Jerlyn Moons, MD;  Location: [facility];  Service: Orthopedics;  Laterality: Right;   MENISCUS REPAIR Left 01/2014   TOOTH EXTRACTION     UPPER GASTROINTESTINAL  ENDOSCOPY  05/29/2021    Current Medications: Current Meds  Medication Sig   acetaminophen  (TYLENOL ) 325 MG tablet Take 2 tablets (650 mg total) by mouth every 6 (six) hours as needed for mild pain, fever or headache.   aspirin  EC 81 MG tablet Take 1 tablet (81 mg total) by mouth daily. Swallow whole.   pravastatin  (PRAVACHOL ) 20 MG tablet Take 1 tablet (20 mg total) by mouth every evening.   traMADol  (ULTRAM ) 50 MG tablet Take 1 tablet (50 mg total) by mouth every 12 (twelve) hours as needed.     Allergies:   Patient has no known allergies.   Social History   Socioeconomic History   Marital status: Single    Spouse name: Not on file   Number of children: 1   Years of education: Not on file   Highest education level: Not on file  Occupational History   Occupation: owner/operator  Tobacco Use   Smoking status: Former    Current packs/day: 0.00    Types: Cigarettes    Quit date: 05/28/2018    Years since quitting: 5.2   Smokeless tobacco: Never  Vaping Use   Vaping status: Never Used  Substance and Sexual Activity   Alcohol use: Not Currently    Alcohol/week: 1.0 standard drink of alcohol    Types: 1 Cans of beer per week   Drug use: Never   Sexual activity: Not Currently  Birth control/protection: Condom  Other Topics Concern   Not on file  Social History Narrative   05/22/21   From: the area   Living: alone   Work: Teacher, adult education      Family: family is all over the place, has cousins which are local in [place]      Enjoys: learning to play guitar, DJ, music, Counsellor      Exercise: knee/back pain limit this   Diet: could be better - avoids red meats/pork      Safety   Seat belts: Yes    Guns: No   Safe in relationships: Yes       Daughter: 75   Social Drivers of Corporate investment banker Strain: Not on file  Food Insecurity: No Food Insecurity (05/24/2023)   Hunger Vital Sign    Worried About Running Out of Food in the Last  Year: Never true    Ran Out of Food in the Last Year: Never true  Transportation Needs: No Transportation Needs (05/17/2023)   PRAPARE - Administrator, Civil Service (Medical): No    Lack of Transportation (Non-Medical): No  Physical Activity: Not on file  Stress: Not on file  Social Connections: Not on file     Family History: The patient's family history includes Aneurysm in his sister; Asthma in his sister; Breast cancer in his mother; Cancer in his maternal aunt; Colon cancer (age of onset: 43) in his mother; Colon polyps in his mother; Deep vein thrombosis in his brother; Diabetes in his maternal grandmother; Heart attack in his brother; Heart attack (age of onset: 9) in his father; Heart disease in his father; Heart failure in his father; Hyperlipidemia in his brother and sister; Hypertension in his brother, maternal aunt, maternal grandmother, maternal uncle, paternal aunt, paternal uncle, and sister; Liver cancer in his maternal uncle; Lymphoma in his mother. There is no history of Esophageal cancer, Rectal cancer, or Stomach cancer.  ROS:   Please see the history of present illness.     All other systems reviewed and are negative.  EKGs/Labs/Other Studies Reviewed:    The following studies were reviewed today:  EKG Interpretation Date/Time:  Monday August 16 2023 09:16:56 EDT Ventricular Rate:  65 PR Interval:  194 QRS Duration:  80 QT Interval:  408 QTC Calculation: 424 R Axis:   -7  Text Interpretation: Normal sinus rhythm Normal ECG Confirmed by Constancia Delton ([id number]) on 08/16/2023 9:23:46 AM    Recent Labs: 12/01/2022: TSH 1.690 05/13/2023: Magnesium  1.9 06/19/2023: ALT 23; BUN 19; Creatinine, Ser 0.84; Hemoglobin 12.1; Platelets 261; Potassium 4.2; Sodium 138  Recent Lipid Panel    Component Value Date/Time   CHOL 234 (H) 07/15/2022 1004   TRIG 79.0 07/15/2022 1004   HDL 64.90 07/15/2022 1004   CHOLHDL 4 07/15/2022 1004   VLDL 15.8 07/15/2022 1004    LDLCALC 153 (H) 07/15/2022 1004     Risk Assessment/Calculations:            Physical Exam:    VS:  BP (!) 140/80 (BP Location: Left Arm, Patient Position: Sitting, Cuff Size: Normal)   Pulse 65   Ht 6' (1.829 m)   Wt 184 lb 8 oz (83.7 kg)   SpO2 98%   BMI 25.02 kg/m     Wt Readings from Last 3 Encounters:  08/16/23 184 lb 8 oz (83.7 kg)  06/29/23 177 lb (80.3 kg)  06/22/23 176 lb 6.4 oz (80 kg)  GEN:  Well nourished, well developed in no acute distress HEENT: Normal NECK: No JVD; No carotid bruits CARDIAC: RRR, no murmurs, rubs, gallops RESPIRATORY:  Clear to auscultation without rales, wheezing or rhonchi  ABDOMEN: Soft, non-tender, non-distended MUSCULOSKELETAL:  No edema; No deformity  SKIN: Warm and dry NEUROLOGIC:  Alert and oriented x 3 PSYCHIATRIC:  Normal affect   ASSESSMENT:    1. Coronary artery disease involving native heart without angina pectoris, unspecified vessel or lesion type   2. Pre-procedural cardiovascular examination   3. Pure hypercholesterolemia   4. Elevated BP without diagnosis of hypertension    PLAN:    In order of problems listed above:  RCA calcification causing minimal stenosis less than 25%.  Echo with normal EF 50 to 55%.  Continue aspirin  81 mg daily, pravastatin  20 mg daily.  Lipitor caused muscle cramps.   Preprocedural exam, right groin hernia repair being planned.  Okay for surgery from a cardiac perspective. Hyperlipidemia, continue pravastatin  20 mg daily. Elevated BP, BP usually controlled.  Monitor off medications.  Follow-up 6-12 months     Medication Adjustments/Labs and Tests Ordered: Current medicines are reviewed at length with the patient today.  Concerns regarding medicines are outlined above.  Orders Placed This Encounter  Procedures   EKG 12-Lead   No orders of the defined types were placed in this encounter.   Patient Instructions  Medication Instructions:   Your physician recommends that you  continue on your current medications as directed. Please refer to the Current Medication list given to you today.   *If you need a refill on your cardiac medications before your next appointment, please call your pharmacy*  Lab Work:  No labs ordered today   If you have labs (blood work) drawn today and your tests are completely normal, you will receive your results only by: MyChart Message (if you have MyChart) OR A paper copy in the mail If you have any lab test that is abnormal or we need to change your treatment, we will call you to review the results.  Testing/Procedures:  No test ordered today   Follow-Up:  At [facility], you and your health needs are our priority.  As part of our continuing mission to provide you with exceptional heart care, our providers are all part of one team.  This team includes your primary Cardiologist (physician) and Advanced Practice Providers or APPs (Physician Assistants and Nurse Practitioners) who all work together to provide you with the care you need, when you need it.  Your next appointment:    12 month(s)  Provider:   You may see [place] or one of the following Advanced Practice Providers on your designated Care Team:   Laneta Pintos, NP Gildardo Labrador, [name] Varney Gentleman, [name] Cadence [place], [name] Ronald Cockayne, NP Morey Ar, NP   We recommend signing up for the patient portal called "MyChart".  Sign up information is provided on this After Visit Summary.  MyChart is used to connect with patients for Virtual Visits (Telemedicine).  Patients are able to view lab/test results, encounter notes, upcoming appointments, etc.  Non-urgent messages can be sent to your provider as well.   To learn more about what you can do with MyChart, go to [email or web address].          Signed, Constancia Delton, MD  08/16/2023 10:08 AM     [facility]

## 2023-08-20 DIAGNOSIS — K4020 Bilateral inguinal hernia, without obstruction or gangrene, not specified as recurrent: Secondary | ICD-10-CM | POA: Diagnosis not present

## 2023-09-07 NOTE — Progress Notes (Unsigned)
 [facility] Return Visit   Referring Provider Corwin Antu, FNP [address] [place],  [place] [address]  Primary Care Provider Corwin Antu, FNP  Patient Profile: Marcus Andrews is a 63 y.o. male who returns to the [facility] for follow-up of the problem(s) noted below.  Problem List: History of recurrent diverticulitis complicated by perforation 11/2022 GERD with LA grade A esophagitis and erosive gastropathy Family history of colon cancer in a first-degree relative Intermittent left-sided abdominal pain and bloating Fluid collection abutting pancreas on CTAP 06/2023   History of Present Illness   Marcus Andrews was last seen in the GI office 04/21/2022 by Dr. Aneita  Current GI Meds    Interval History   History of recurrent diverticulitis complicated by perforation 11/2022 -- Marcus Andrews was hospitalized 11/2022 for diverticulitis complicated by microperforation -- Colonoscopy 12/2022 showed multiple diverticula in the left colon and narrowing of the colon in association with diverticular opening, IH, EH, otherwise normal -- Plan for elective sigmoid resection January 2025  -- Admitted to the hospital 02/2023 for with abdominal pain and CT scan concerning for diverticulitis with abscess status post percutaneous drainage and antibiotics  -- 05/12/2023 status post robotic low anterior rectosigmoid resection with anastomosis -pathology showed diverticulosis without other concerning findings  -- 06/19/2023 presented to ED with RLQ abdominal pain -- CTAP showed a fluid collection at the tail of the pancreas; he was afebrile and did not have leukocytosis, lipase 29  GERD, LA Grade A esophagitis   Last colonoscopy: 12/2022 - multiple diverticula in the left colon and narrowing of the colon in association with diverticular opening, IH, EH, otherwise normal Last endoscopy: 05/2021 - LA grade A esophagitis, multiple dispersed erosions in the gastric  antrum, o/w normal  Last Abd CT/CTE/MRE: CTAP 06/19/2023 -postsurgical changes of sigmoid resection; small fluid collection abutting the tail of the pancreas likely representing an inflammatory collection, possibly pseudocyst secondary to pancreatitis or an area of fat necrosis  GI Review of Symptoms Significant for {[id number]}. Otherwise negative.  General Review of Systems  Review of systems is significant for the pertinent positives and negatives as listed per the HPI.  Full ROS is otherwise negative.  Past Medical History   Past Medical History:  Diagnosis Date   Arthritis    Colon polyp    Diverticulosis    Pre-diabetes    Refusal of blood transfusions as patient is Jehovah's Witness      Past Surgical History   Past Surgical History:  Procedure Laterality Date   COLONOSCOPY     FLEXIBLE SIGMOIDOSCOPY N/A 05/12/2023   Procedure: FLEXIBLE SIGMOIDOSCOPY;  Surgeon: Sheldon Standing, MD;  Location: [facility];  Service: General;  Laterality: N/A;   IR RADIOLOGIST EVAL & MGMT  03/30/2023   KNEE ARTHROSCOPY WITH MEDIAL MENISECTOMY Right 02/05/2020   Procedure: Right knee arthroscopy with partial medial and lateral menisectomy, chondroplasty, partial synovectomy, removal loose body;  Surgeon: Leora Lynwood JONELLE, MD;  Location: [facility];  Service: Orthopedics;  Laterality: Right;   MENISCUS REPAIR Left 01/2014   TOOTH EXTRACTION     UPPER GASTROINTESTINAL ENDOSCOPY  05/29/2021     Allergies and Medications   No Known Allergies @MEDSTODAY @  Family His   Family History  Problem Relation Age of Onset   Breast cancer Mother    Colon cancer Mother 44   Colon polyps Mother    Lymphoma Mother    Heart disease Father    Heart failure Father  Heart attack Father 86   Hypertension Sister    Hyperlipidemia Sister    Asthma Sister    Aneurysm Sister    Hypertension Brother    Hyperlipidemia Brother    Deep vein thrombosis Brother    Heart attack Brother        50-60    Hypertension Maternal Aunt    Cancer Maternal Aunt        type unknown   Hypertension Maternal Uncle    Liver cancer Maternal Uncle    Hypertension Paternal Aunt    Hypertension Paternal Uncle    Hypertension Maternal Grandmother    Diabetes Maternal Grandmother    Esophageal cancer Neg Hx    Rectal cancer Neg Hx    Stomach cancer Neg Hx    GI Specific Family History: {gifamhx:[id number]}   Social History   Social History   Tobacco Use   Smoking status: Former    Current packs/day: 0.00    Types: Cigarettes    Quit date: 05/28/2018    Years since quitting: 5.2   Smokeless tobacco: Never  Vaping Use   Vaping status: Never Used  Substance Use Topics   Alcohol use: Not Currently    Alcohol/week: 1.0 standard drink of alcohol    Types: 1 Cans of beer per week   Drug use: Never   [name] reports that he quit smoking about 5 years ago. His smoking use included cigarettes. He has never used smokeless tobacco. He reports that he does not currently use alcohol after a past usage of about 1.0 standard drink of alcohol per week. He reports that he does not use drugs.  Vital Signs and Physical Examination   There were no vitals filed for this visit. There is no height or weight on file to calculate BMI.    General: Well developed, well nourished, no acute distress Head: Normocephalic and atraumatic Eyes: Sclerae anicteric, EOMI Ears: Normal auditory acuity Mouth: No deformities or lesions noted Lungs: Clear throughout to auscultation Heart: Regular rate and rhythm; No murmurs, rubs or bruits Abdomen: Soft, non tender and non distended. No masses, hepatosplenomegaly or hernias noted. Normal Bowel sounds Rectal: Musculoskeletal: Symmetrical with no gross deformities  Pulses:  Normal pulses noted Extremities: No edema or deformities noted Neurological: Alert oriented x 4, grossly nonfocal Psychological:  Alert and cooperative. Normal mood and affect   Review of Data   The  following data was reviewed at the time of this encounter:   Laboratory Studies      Latest Ref Rng & Units 06/19/2023    1:24 PM 05/15/2023    9:18 AM 05/13/2023    5:10 AM  CBC  WBC 4.0 - 10.5 K/uL 8.0   9.7   Hemoglobin 13.0 - 17.0 g/dL 87.8  88.0  87.0   Hematocrit 39.0 - 52.0 % 38.6   40.1   Platelets 150 - 400 K/uL 261   238     Lab Results  Component Value Date   LIPASE 29 06/19/2023      Latest Ref Rng & Units 06/19/2023    1:24 PM 06/15/2023   10:39 AM 05/13/2023    5:10 AM  CMP  Glucose 70 - 99 mg/dL 896  86  878   BUN 8 - 23 mg/dL 19  16  11    Creatinine 0.61 - 1.24 mg/dL 9.15  9.10  9.14   Sodium 135 - 145 mmol/L 138  143  132   Potassium 3.5 - 5.1 mmol/L 4.2  4.6  3.9   Chloride 98 - 111 mmol/L 105  103  99   CO2 22 - 32 mmol/L 27  21  25    Calcium  8.9 - 10.3 mg/dL 8.7  9.5  8.1   Total Protein 6.5 - 8.1 g/dL 6.6     Total Bilirubin 0.0 - 1.2 mg/dL 0.7     Alkaline Phos 38 - 126 U/L 74     AST 15 - 41 U/L 18     ALT 0 - 44 U/L 23        Imaging Studies  CTAP 06/19/2023 1. Small fluid collection abutting the tail of the pancreas likely representing an inflammatory collection and possibly developing pseudocyst secondary to pancreatitis or an area of fat necrosis. Correlation with pancreatic enzymes and attention on follow-up imaging recommended. Alternatively this could be a developing abscess secondary to spread of infected content related to prior diverticular abscess. 2. Postsurgical changes of sigmoid resection. No bowel obstruction. Normal appendix.  CTAP 03/30/2023 1. Resolution of pericolonic abscess catheter with well-positioned drain in place. No new or undrained abscess is identified. 2. Sigmoid diverticulosis as above without evidence of active inflammation. 3. Aortic Atherosclerosis (ICD10-I70.0) and Emphysema (ICD10-J43.9).   CTAP 03/16/2023 Wall thickening and inflammatory changes along the sigmoid colon consistent with diverticulitis. There  is what appears to be an extraluminal abscess extending caudal to the sigmoid colon measuring 4.6 x 3.2 cm. This is immediately adjacent to the dome of the bladder with some bladder wall thickening and adjacent stranding. Additional smaller intramural abscess suggested along the course of the sigmoid colon just proximal to this. Please correlate clinical presentation and recommend follow up after treatment to confirm resolution and exclude secondary pathology.   Stable mild ectasia of the pancreatic duct without focal pancreatic atrophy or obvious pancreatic mass.  CTAP 12/07/2022 1. No substantial interval change in the appearance of the abdomen or pelvis. 2. Persistent findings of acute sigmoid diverticulitis with scattered small foci of extraluminal gas trapped in the sigmoid mesocolon. Free intraperitoneal gas seen in the upper abdomen on the previous exam has resolved. No rim enhancing or organized fluid collection to suggest drainable abscess at this time.      GI Procedures and Studies  Colonoscopy 12/2022 Multiple diverticula in the left colon and narrowing of the colon in association with diverticular opening, IH, EH, otherwise normal  EGD 05/29/2021 LA grade A esophagitis, multiple dispersed erosions in the gastric antrum, o/w normal   Clinical Impression  It is my clinical impression that Mr. Kissoon is a 63 y.o. male with;  ***  Plan  *** *** *** *** ***   Planned Follow Up No follow-ups on file.  The patient or caregiver verbalized understanding of the material covered, with no barriers to understanding. All questions were answered. Patient or caregiver is agreeable with the plan outlined above.    It was a pleasure to see Quashawn.  If you have any questions or concerns regarding this evaluation, do not hesitate to contact me.  Inocente Hausen, MD [facility]

## 2023-09-08 ENCOUNTER — Encounter: Payer: Self-pay | Admitting: Pediatrics

## 2023-09-08 ENCOUNTER — Ambulatory Visit (INDEPENDENT_AMBULATORY_CARE_PROVIDER_SITE_OTHER): Admitting: Pediatrics

## 2023-09-08 VITALS — BP 130/100 | HR 76 | Ht 70.25 in | Wt 186.1 lb

## 2023-09-08 DIAGNOSIS — R109 Unspecified abdominal pain: Secondary | ICD-10-CM

## 2023-09-08 DIAGNOSIS — K2100 Gastro-esophageal reflux disease with esophagitis, without bleeding: Secondary | ICD-10-CM | POA: Diagnosis not present

## 2023-09-08 DIAGNOSIS — R933 Abnormal findings on diagnostic imaging of other parts of digestive tract: Secondary | ICD-10-CM | POA: Diagnosis not present

## 2023-09-08 DIAGNOSIS — K3189 Other diseases of stomach and duodenum: Secondary | ICD-10-CM

## 2023-09-08 DIAGNOSIS — Z800 Family history of malignant neoplasm of digestive organs: Secondary | ICD-10-CM | POA: Diagnosis not present

## 2023-09-08 DIAGNOSIS — K5732 Diverticulitis of large intestine without perforation or abscess without bleeding: Secondary | ICD-10-CM

## 2023-09-08 DIAGNOSIS — Z8719 Personal history of other diseases of the digestive system: Secondary | ICD-10-CM

## 2023-09-08 DIAGNOSIS — R140 Abdominal distension (gaseous): Secondary | ICD-10-CM | POA: Diagnosis not present

## 2023-09-08 DIAGNOSIS — K219 Gastro-esophageal reflux disease without esophagitis: Secondary | ICD-10-CM

## 2023-09-08 NOTE — Patient Instructions (Addendum)
 Try a probiotic for bloating and gas, such as Culturelle, Florastor, or Align.   You can also try Beano or GasX.   If not better consider SIBO treatment.   Thank you for entrusting me with your care and for choosing [facility], Dr. Inocente Hausen   _______________________________________________________  If your blood pressure at your visit was 140/90 or greater, please contact your primary care physician to follow up on this.  _______________________________________________________  If you are age 19 or older, your body mass index should be between 23-30. Your Body mass index is 26.52 kg/m. If this is out of the aforementioned range listed, please consider follow up with your Primary Care Provider.  If you are age 37 or younger, your body mass index should be between 19-25. Your Body mass index is 26.52 kg/m. If this is out of the aformentioned range listed, please consider follow up with your Primary Care Provider.   ________________________________________________________  The [facility] providers would like to encourage you to use MYCHART to communicate with providers for non-urgent requests or questions.  Due to long hold times on the telephone, sending your provider a message by [facility] may be a faster and more efficient way to get a response.  Please allow 48 business hours for a response.  Please remember that this is for non-urgent requests.  _______________________________________________________

## 2023-09-22 DIAGNOSIS — M170 Bilateral primary osteoarthritis of knee: Secondary | ICD-10-CM | POA: Diagnosis not present

## 2023-09-23 ENCOUNTER — Ambulatory Visit: Payer: Self-pay

## 2023-09-23 ENCOUNTER — Ambulatory Visit: Admitting: Internal Medicine

## 2023-09-23 ENCOUNTER — Encounter: Payer: Self-pay | Admitting: Internal Medicine

## 2023-09-23 VITALS — BP 136/94 | HR 79 | Temp 98.8°F | Ht 70.25 in | Wt 191.0 lb

## 2023-09-23 DIAGNOSIS — I10 Essential (primary) hypertension: Secondary | ICD-10-CM | POA: Diagnosis not present

## 2023-09-23 NOTE — Telephone Encounter (Signed)
 Noted---will assess at today's visit

## 2023-09-23 NOTE — Assessment & Plan Note (Signed)
 BP Readings from Last 3 Encounters:  09/23/23 (!) 136/94  09/08/23 (!) 130/100  08/16/23 (!) 140/80   Repeat on right is 140/82 Discussed rationale for BP treatment He has been more sedentary lately--discussed that this can influence it Recent coronary calcium  score was not bad He would like to hold off on Rx for now

## 2023-09-23 NOTE — Progress Notes (Signed)
 Subjective:    Patient ID: Marcus Andrews, male    DOB: May 26, 1960, 63 y.o.   MRN: [id number]  HPI Here due to elevated blood pressure  Has been checking BP regularly 124/86 - 140/80 But then 153/97, 168/107 since yesterday Has been borderline at cardiology---144/90, 140/80  Slight headache last night/this morning Brief blurry vision 2 days ago for 10 minutes  No chest pain No SOB No exercise lately due to colon resection/hernia surgery recently No dizziness or syncope  Checked his BP cuff here---20-30 higher in his  Current Outpatient Medications on File Prior to Visit  Medication Sig Dispense Refill   acetaminophen  (TYLENOL ) 325 MG tablet Take 2 tablets (650 mg total) by mouth every 6 (six) hours as needed for mild pain, fever or headache.     aspirin  EC 81 MG tablet Take 1 tablet (81 mg total) by mouth daily. Swallow whole.     ibuprofen  (ADVIL ) 200 MG tablet Take 200-600 mg by mouth as needed.     methocarbamol  (ROBAXIN ) 500 MG tablet Take 500 mg by mouth as needed.     pravastatin  (PRAVACHOL ) 20 MG tablet Take 1 tablet (20 mg total) by mouth every evening. 90 tablet 3   traMADol  (ULTRAM ) 50 MG tablet Take 1 tablet (50 mg total) by mouth every 12 (twelve) hours as needed.     No current facility-administered medications on file prior to visit.    No Known Allergies  Past Medical History:  Diagnosis Date   Arthritis    Colon polyp    Diverticulosis    Pre-diabetes    Refusal of blood transfusions as patient is Jehovah's Witness     Past Surgical History:  Procedure Laterality Date   COLONOSCOPY     FLEXIBLE SIGMOIDOSCOPY N/A 05/12/2023   Procedure: FLEXIBLE SIGMOIDOSCOPY;  Surgeon: Sheldon Standing, MD;  Location: [facility];  Service: General;  Laterality: N/A;   IR RADIOLOGIST EVAL & MGMT  03/30/2023   KNEE ARTHROSCOPY WITH MEDIAL MENISECTOMY Right 02/05/2020   Procedure: Right knee arthroscopy with partial medial and lateral menisectomy, chondroplasty, partial  synovectomy, removal loose body;  Surgeon: Leora Lynwood JONELLE, MD;  Location: [facility];  Service: Orthopedics;  Laterality: Right;   MENISCUS REPAIR Left 01/2014   TOOTH EXTRACTION     UPPER GASTROINTESTINAL ENDOSCOPY  05/29/2021    Family History  Problem Relation Age of Onset   Breast cancer Mother    Colon cancer Mother 9   Colon polyps Mother    Lymphoma Mother    Heart disease Father    Heart failure Father    Heart attack Father 32   Hypertension Sister    Hyperlipidemia Sister    Asthma Sister    Aneurysm Sister    Hypertension Brother    Hyperlipidemia Brother    Deep vein thrombosis Brother    Heart attack Brother        50-60   Hypertension Maternal Aunt    Cancer Maternal Aunt        type unknown   Hypertension Maternal Uncle    Liver cancer Maternal Uncle    Hypertension Paternal Aunt    Hypertension Paternal Uncle    Hypertension Maternal Grandmother    Diabetes Maternal Grandmother    Esophageal cancer Neg Hx    Rectal cancer Neg Hx    Stomach cancer Neg Hx     Social History   Socioeconomic History   Marital status: Single    Spouse name: Not on file  Number of children: 1   Years of education: Not on file   Highest education level: Not on file  Occupational History   Occupation: owner/operator  Tobacco Use   Smoking status: Former    Current packs/day: 0.00    Types: Cigarettes    Quit date: 05/28/2018    Years since quitting: 5.3   Smokeless tobacco: Never  Vaping Use   Vaping status: Never Used  Substance and Sexual Activity   Alcohol use: Not Currently    Alcohol/week: 1.0 standard drink of alcohol    Types: 1 Cans of beer per week   Drug use: Never   Sexual activity: Not Currently    Birth control/protection: Condom  Other Topics Concern   Not on file  Social History Narrative   05/22/21   From: the area   Living: alone   Work: Teacher, adult education      Family: family is all over the place, has cousins which are  local in [place]      Enjoys: learning to play guitar, DJ, music, Counsellor      Exercise: knee/back pain limit this   Diet: could be better - avoids red meats/pork      Safety   Seat belts: Yes    Guns: No   Safe in relationships: Yes       Daughter: 53   Social Drivers of Corporate investment banker Strain: Not on file  Food Insecurity: No Food Insecurity (05/24/2023)   Hunger Vital Sign    Worried About Running Out of Food in the Last Year: Never true    Ran Out of Food in the Last Year: Never true  Transportation Needs: No Transportation Needs (05/17/2023)   PRAPARE - Administrator, Civil Service (Medical): No    Lack of Transportation (Non-Medical): No  Physical Activity: Not on file  Stress: Not on file  Social Connections: Not on file  Intimate Partner Violence: Not At Risk (05/17/2023)   Humiliation, Afraid, Rape, and Kick questionnaire    Fear of Current or Ex-Partner: No    Emotionally Abused: No    Physically Abused: No    Sexually Abused: No   Review of Systems Sleeps fair-- 6-7 hours per night Does have sleep study scheduled     Objective:   Physical Exam Constitutional:      Appearance: Normal appearance.  Cardiovascular:     Rate and Rhythm: Normal rate and regular rhythm.     Heart sounds: No murmur heard.    No gallop.  Pulmonary:     Effort: Pulmonary effort is normal.     Breath sounds: Normal breath sounds. No wheezing or rales.  Musculoskeletal:     Cervical back: Neck supple.     Right lower leg: No edema.     Left lower leg: No edema.  Lymphadenopathy:     Cervical: No cervical adenopathy.  Neurological:     Mental Status: He is alert.  Psychiatric:        Mood and Affect: Mood normal.        Behavior: Behavior normal.            Assessment & Plan:

## 2023-09-23 NOTE — Telephone Encounter (Signed)
 NOTED

## 2023-09-23 NOTE — Telephone Encounter (Signed)
 FYI Only or Action Required?: FYI only for provider.  Patient was last seen in primary care on 06/22/2023 by Corwin Antu, FNP.  Called Nurse Triage reporting Hypertension.  Symptoms began several days ago.  Interventions attempted: Nothing.  Symptoms are: gradually worsening. Pt is having elevated bp's. Pt also reports blurry vision from time to time ongoing for years.  Triage Disposition: See Physician Within 24 Hours  Patient/caregiver understands and will follow disposition?: Yes                      Copied from CRM [id number]. Topic: Clinical - Red Word Triage >> Sep 23, 2023  9:28 AM Deleta RAMAN wrote: Red Word that prompted transfer to Nurse Triage: high blood pressure 173/109 and 168/107 Reason for Disposition  Systolic BP >= 180 OR Diastolic >= 110  Answer Assessment - Initial Assessment Questions 1. BLOOD PRESSURE: What is your blood pressure? Did you take at least two measurements 5 minutes apart?     173/109 last night and today 168/107, yesterday at OV 157 over 9- 2. ONSET: When did you take your blood pressure?     Yesterday and today 3. HOW: How did you take your blood pressure? (e.g., automatic home BP monitor, visiting nurse)     Automatic home. 4. HISTORY: Do you have a history of high blood pressure?     no 5. MEDICINES: Are you taking any medicines for blood pressure? Have you missed any doses recently?     none 6. OTHER SYMPTOMS: Do you have any symptoms? (e.g., blurred vision, chest pain, difficulty breathing, headache, weakness)     Blurred vision Tuesday afternoon - has gone on for years.  Protocols used: Blood Pressure - High-A-AH

## 2023-09-29 ENCOUNTER — Ambulatory Visit: Admitting: Family

## 2023-10-07 DIAGNOSIS — Z79891 Long term (current) use of opiate analgesic: Secondary | ICD-10-CM | POA: Diagnosis not present

## 2023-10-07 DIAGNOSIS — M170 Bilateral primary osteoarthritis of knee: Secondary | ICD-10-CM | POA: Diagnosis not present

## 2023-10-25 DIAGNOSIS — M170 Bilateral primary osteoarthritis of knee: Secondary | ICD-10-CM | POA: Diagnosis not present

## 2023-10-29 ENCOUNTER — Other Ambulatory Visit (HOSPITAL_COMMUNITY): Payer: Self-pay

## 2023-11-09 ENCOUNTER — Ambulatory Visit: Payer: Self-pay

## 2023-11-09 NOTE — Telephone Encounter (Signed)
                Reason for Disposition  [1] COVID-19 diagnosed by positive lab test (e.g., PCR, rapid self-test kit) AND [2] mild symptoms (e.g., cough, fever, others) AND [3] no complications or SOB  Answer Assessment - Initial Assessment Questions Call routed from office to [facility] nurse triage. Patient states he took a Covid test yesterday at home and it was negative. He states he took another test today and it was positive. Pt taking otc cold and flu meds for symptoms. Pt already scheduled in office for appointment tomorrow due to symptoms. Offered to schedule today at a UC for symptoms. Patient declined and states he will wait until tomorrow morning for his appointment.   1. COVID-19 DIAGNOSIS: How do you know that you have COVID? (e.g., positive lab test or self-test, diagnosed by doctor or NP/PA, symptoms after exposure).     At home test.  2. COVID-19 EXPOSURE: Was there any known exposure to COVID before the symptoms began? CDC Definition of close contact: within 6 feet (2 meters) for a total of 15 minutes or more over a 24-hour period.      Unknown  3. ONSET: When did the COVID-19 symptoms start?      Yesterday  4. WORST SYMPTOM: What is your worst symptom? (e.g., cough, fever, shortness of breath, muscle aches)     Body aches, chills.  5. COUGH: Do you have a cough? If Yes, ask: How bad is the cough?       Yes  6. FEVER: Do you have a fever? If Yes, ask: What is your temperature, how was it measured, and when did it start?     Pt states he believes he has a fever.  7. RESPIRATORY STATUS: Describe your breathing? (e.g., normal; shortness of breath, wheezing, unable to speak)      No 8. BETTER-SAME-WORSE: Are you getting better, staying the same or getting worse compared to yesterday?  If getting worse, ask, In what way?     Symptoms are worsening  9. OTHER SYMPTOMS: Do you have any other symptoms?  (e.g., chills, fatigue, headache, loss of smell or  taste, muscle pain, sore throat)     Fever, headaches, chills, body aches, and cold sweats.  10. HIGH RISK DISEASE: Do you have any chronic medical problems? (e.g., asthma, heart or lung disease, weak immune system, obesity, etc.)       Denies any chronic medical problems.  13. O2 SATURATION MONITOR:  Do you use an oxygen saturation monitor (pulse oximeter) at home? If Yes, ask What is your reading (oxygen level) today? What is your usual oxygen saturation reading? (e.g., 95%)       Has not been able to check but has one at home.  Protocols used: Coronavirus (COVID-19) Diagnosed or Suspected-A-AH

## 2023-11-09 NOTE — Telephone Encounter (Signed)
 FYI Only or Action Required?: FYI only for provider.  Patient was last seen in primary care on 09/23/2023 by Marcus Charlie FERNS, MD.  Called Nurse Triage reporting Covid Positive.  Symptoms began yesterday.  Interventions attempted: OTC medications: cold and flu meds.  Symptoms are: gradually worsening.  Triage Disposition: See Physician Within 24 Hours, Home Care  Patient/caregiver understands and will follow disposition?: Yes  Reason for Disposition  [1] COVID-19 diagnosed by positive lab test (e.g., PCR, rapid self-test kit) AND [2] mild symptoms (e.g., cough, fever, others) AND [3] no complications or SOB  Answer Assessment - Initial Assessment Questions Call routed from office to [facility] nurse triage. Patient states he took a Covid test yesterday at home and it was negative. He states he took another test today and it was positive. Pt taking otc cold and flu meds for symptoms. Pt already scheduled in office for appointment tomorrow due to symptoms. Offered to schedule today at a UC for symptoms. Patient declined and states he will wait until tomorrow morning for his appointment.   1. COVID-19 DIAGNOSIS: How do you know that you have COVID? (e.g., positive lab test or self-test, diagnosed by doctor or NP/PA, symptoms after exposure).     At home test.  2. COVID-19 EXPOSURE: Was there any known exposure to COVID before the symptoms began? CDC Definition of close contact: within 6 feet (2 meters) for a total of 15 minutes or more over a 24-hour period.      Unknown  3. ONSET: When did the COVID-19 symptoms start?      Yesterday  4. WORST SYMPTOM: What is your worst symptom? (e.g., cough, fever, shortness of breath, muscle aches)     Body aches, chills.  5. COUGH: Do you have a cough? If Yes, ask: How bad is the cough?       Yes  6. FEVER: Do you have a fever? If Yes, ask: What is your temperature, how was it measured, and when did it start?     Pt states he believes he  has a fever.  7. RESPIRATORY STATUS: Describe your breathing? (e.g., normal; shortness of breath, wheezing, unable to speak)      No 8. BETTER-SAME-WORSE: Are you getting better, staying the same or getting worse compared to yesterday?  If getting worse, ask, In what way?     Symptoms are worsening  9. OTHER SYMPTOMS: Do you have any other symptoms?  (e.g., chills, fatigue, headache, loss of smell or taste, muscle pain, sore throat)     Fever, headaches, chills, body aches, and cold sweats.  10. HIGH RISK DISEASE: Do you have any chronic medical problems? (e.g., asthma, heart or lung disease, weak immune system, obesity, etc.)       Denies any chronic medical problems.  13. O2 SATURATION MONITOR:  Do you use an oxygen saturation monitor (pulse oximeter) at home? If Yes, ask What is your reading (oxygen level) today? What is your usual oxygen saturation reading? (e.g., 95%)       Has not been able to check but has one at home.  Protocols used: Coronavirus (COVID-19) Diagnosed or Suspected-A-AH

## 2023-11-10 ENCOUNTER — Ambulatory Visit: Admitting: Family

## 2023-11-10 VITALS — BP 110/70 | HR 72 | Temp 99.5°F | Wt 190.6 lb

## 2023-11-10 DIAGNOSIS — R509 Fever, unspecified: Secondary | ICD-10-CM | POA: Diagnosis not present

## 2023-11-10 DIAGNOSIS — U071 COVID-19: Secondary | ICD-10-CM

## 2023-11-10 DIAGNOSIS — Z20822 Contact with and (suspected) exposure to covid-19: Secondary | ICD-10-CM | POA: Diagnosis not present

## 2023-11-10 LAB — POC COVID19 BINAXNOW: SARS Coronavirus 2 Ag: POSITIVE — AB

## 2023-11-10 MED ORDER — NIRMATRELVIR/RITONAVIR (PAXLOVID)TABLET: 3.0000 | ORAL_TABLET | Freq: Two times a day (BID) | ORAL | 0 refills | Status: AC

## 2023-11-10 NOTE — Progress Notes (Signed)
 Established Patient Office Visit  Subjective:      CC:  Chief Complaint  Patient presents with   Acute Visit    Reports body aches, fever, cough, headache x2 days. Home tested and was COVID +.    HPI: Marcus Andrews is a 63 y.o. male presenting on 11/10/2023 for Acute Visit (Reports body aches, fever, cough, headache x2 days. Home tested and was COVID +.) .  Discussed the use of AI scribe software for clinical note transcription with the patient, who gave verbal consent to proceed.  History of Present Illness Marcus Andrews is a 63 year old male who presents with symptoms of COVID-19, including fever, body aches, and congestion.  He began experiencing body aches on Monday and initially tested negative for COVID-19 at home, but a subsequent test yesterday was positive. His symptoms include body aches, cough with phlegm, headache, congestion, sneezing, and a fever that peaked at 103.69F on Monday. No shortness of breath, sore throat, or ear pain.  He has been managing his symptoms with over-the-counter cold medications, including NyQuil and DayQuil, and some generic night and day pills. He has not been using Tylenol  or ibuprofen  regularly.  His past medical history includes high cholesterol for which he takes pravastatin , but he has not taken it in the past 24 hours due to his illness. He also has a history of using tramadol , which he last took on Monday, and he uses ibuprofen  occasionally. He has not been taking baby aspirin  as advised by his cardiologist. He has a history of three hernias on the right side and one on the left, which have caused him abdominal pain in the past.  He mentions that his cousin and brother have taken Paxlovid  for COVID-19, with mixed experiences regarding side effects.         Social history:  Relevant past medical, surgical, family and social history reviewed and updated as indicated. Interim medical history since our last visit  reviewed.  Allergies and medications reviewed and updated.  DATA REVIEWED: CHART IN EPIC     ROS: Negative unless specifically indicated above in HPI.    Current Outpatient Medications:    acetaminophen  (TYLENOL ) 325 MG tablet, Take 2 tablets (650 mg total) by mouth every 6 (six) hours as needed for mild pain, fever or headache., Disp: , Rfl:    aspirin  EC 81 MG tablet, Take 1 tablet (81 mg total) by mouth daily. Swallow whole., Disp: , Rfl:    ibuprofen  (ADVIL ) 200 MG tablet, Take 200-600 mg by mouth as needed., Disp: , Rfl:    methocarbamol  (ROBAXIN ) 500 MG tablet, Take 500 mg by mouth as needed., Disp: , Rfl:    nirmatrelvir /ritonavir  (PAXLOVID ) 20 x 150 MG & 10 x 100MG  TABS, Take 3 tablets by mouth 2 (two) times daily for 5 days. (Take nirmatrelvir  150 mg two tablets twice daily for 5 days and ritonavir  100 mg one tablet twice daily for 5 days) Patient GFR is >60, Disp: 30 tablet, Rfl: 0   pravastatin  (PRAVACHOL ) 20 MG tablet, Take 1 tablet (20 mg total) by mouth every evening., Disp: 90 tablet, Rfl: 3   traMADol  (ULTRAM ) 50 MG tablet, Take 1 tablet (50 mg total) by mouth every 12 (twelve) hours as needed., Disp: , Rfl:         Objective:        BP 110/70 (BP Location: Left Arm, Patient Position: Sitting, Cuff Size: Normal)   Pulse 72   Temp 99.5  F (37.5 C) (Oral)   Wt 190 lb 9.6 oz (86.5 kg)   SpO2 97%   BMI 27.15 kg/m   Physical Exam HEENT: Throat with white appearance.  Wt Readings from Last 3 Encounters:  11/10/23 190 lb 9.6 oz (86.5 kg)  09/23/23 191 lb (86.6 kg)  09/08/23 186 lb 2 oz (84.4 kg)    Physical Exam Vitals reviewed.  Constitutional:      General: He is not in acute distress.    Appearance: Normal appearance. He is not ill-appearing, toxic-appearing or diaphoretic.  HENT:     Head: Normocephalic.     Right Ear: Tympanic membrane normal.     Left Ear: Tympanic membrane normal.     Nose: Nose normal.     Mouth/Throat:     Mouth: Mucous  membranes are moist.  Eyes:     Pupils: Pupils are equal, round, and reactive to light.  Cardiovascular:     Rate and Rhythm: Normal rate and regular rhythm.  Pulmonary:     Effort: Pulmonary effort is normal.     Breath sounds: Normal breath sounds. No wheezing.  Musculoskeletal:        General: Normal range of motion.     Cervical back: Normal range of motion.  Neurological:     General: No focal deficit present.     Mental Status: He is alert and oriented to person, place, and time. Mental status is at baseline.  Psychiatric:        Mood and Affect: Mood normal.        Behavior: Behavior normal.        Thought Content: Thought content normal.        Judgment: Judgment normal.          Results LABS GFR: within normal limits  Assessment & Plan:   Assessment and Plan Assessment & Plan COVID-19 infection Confirmed COVID-19 infection with symptoms starting on Monday, including body aches, cough with phlegm, headache, congestion, sneezing, and a fever of 103.17F. No shortness of breath or sore throat. He is within the five-day window for starting Paxlovid  since symptom onset was Monday. - Prescribe Paxlovid , three tablets by mouth twice a day for five days. - Instruct to stop pravastatin  24 hours before starting Paxlovid  and for five days after stopping Paxlovid . - Advise to monitor for side effects such as nausea, diarrhea, vomiting, and abdominal pain. - Instruct to wear a mask for five days after symptom improvement and no fever for 24 hours without medication.  Hyperlipidemia Currently not taking pravastatin  due to COVID-19 infection and potential interaction with Paxlovid . - Resume pravastatin  five days after completing Paxlovid  treatment.  Recording duration: 11 minutes      Return if symptoms worsen or fail to improve.     Ginger Patrick, MSN, APRN, FNP-C [facility]

## 2023-12-13 ENCOUNTER — Encounter: Payer: Self-pay | Admitting: Family

## 2023-12-13 ENCOUNTER — Ambulatory Visit

## 2023-12-13 ENCOUNTER — Emergency Department
Admission: EM | Admit: 2023-12-13 | Discharge: 2023-12-13 | Disposition: A | Discharge status: Home / Self Care | Attending: Emergency Medicine | Admitting: Emergency Medicine

## 2023-12-13 ENCOUNTER — Other Ambulatory Visit: Payer: Self-pay

## 2023-12-13 ENCOUNTER — Emergency Department

## 2023-12-13 ENCOUNTER — Telehealth: Payer: Self-pay | Admitting: Family

## 2023-12-13 ENCOUNTER — Ambulatory Visit (INDEPENDENT_AMBULATORY_CARE_PROVIDER_SITE_OTHER): Admitting: Family

## 2023-12-13 ENCOUNTER — Ambulatory Visit: Payer: Self-pay | Admitting: Family

## 2023-12-13 VITALS — BP 126/86 | HR 84 | Temp 98.1°F | Ht 72.0 in | Wt 195.2 lb

## 2023-12-13 DIAGNOSIS — M5441 Lumbago with sciatica, right side: Secondary | ICD-10-CM | POA: Diagnosis not present

## 2023-12-13 DIAGNOSIS — M25562 Pain in left knee: Secondary | ICD-10-CM | POA: Diagnosis not present

## 2023-12-13 DIAGNOSIS — M7981 Nontraumatic hematoma of soft tissue: Secondary | ICD-10-CM | POA: Diagnosis not present

## 2023-12-13 DIAGNOSIS — E785 Hyperlipidemia, unspecified: Secondary | ICD-10-CM

## 2023-12-13 DIAGNOSIS — R799 Abnormal finding of blood chemistry, unspecified: Secondary | ICD-10-CM | POA: Diagnosis not present

## 2023-12-13 DIAGNOSIS — I700 Atherosclerosis of aorta: Secondary | ICD-10-CM

## 2023-12-13 DIAGNOSIS — M25551 Pain in right hip: Secondary | ICD-10-CM

## 2023-12-13 DIAGNOSIS — M7122 Synovial cyst of popliteal space [Baker], left knee: Secondary | ICD-10-CM | POA: Insufficient documentation

## 2023-12-13 DIAGNOSIS — G4719 Other hypersomnia: Secondary | ICD-10-CM

## 2023-12-13 DIAGNOSIS — R600 Localized edema: Secondary | ICD-10-CM

## 2023-12-13 DIAGNOSIS — R238 Other skin changes: Secondary | ICD-10-CM | POA: Diagnosis not present

## 2023-12-13 DIAGNOSIS — D649 Anemia, unspecified: Secondary | ICD-10-CM

## 2023-12-13 DIAGNOSIS — R0683 Snoring: Secondary | ICD-10-CM

## 2023-12-13 DIAGNOSIS — R0602 Shortness of breath: Secondary | ICD-10-CM

## 2023-12-13 LAB — CBC WITH DIFFERENTIAL/PLATELET
Basophils Absolute: 0 K/uL (ref 0.0–0.1)
Basophils Relative: 0.6 % (ref 0.0–3.0)
Eosinophils Absolute: 0.1 K/uL (ref 0.0–0.7)
Eosinophils Relative: 2.6 % (ref 0.0–5.0)
HCT: 40.4 % (ref 39.0–52.0)
Hemoglobin: 12.9 g/dL — ABNORMAL LOW (ref 13.0–17.0)
Lymphocytes Relative: 24 % (ref 12.0–46.0)
Lymphs Abs: 1.4 K/uL (ref 0.7–4.0)
MCHC: 31.9 g/dL (ref 30.0–36.0)
MCV: 82.8 fl (ref 78.0–100.0)
Monocytes Absolute: 0.6 K/uL (ref 0.1–1.0)
Monocytes Relative: 10 % (ref 3.0–12.0)
Neutro Abs: 3.6 K/uL (ref 1.4–7.7)
Neutrophils Relative %: 62.8 % (ref 43.0–77.0)
Platelets: 233 K/uL (ref 150.0–400.0)
RBC: 4.88 Mil/uL (ref 4.22–5.81)
RDW: 17 % — ABNORMAL HIGH (ref 11.5–15.5)
WBC: 5.7 K/uL (ref 4.0–10.5)

## 2023-12-13 LAB — D-DIMER, QUANTITATIVE: D-Dimer, Quant: 1.18 ug{FEU}/mL — ABNORMAL HIGH (ref <0.50)

## 2023-12-13 LAB — LIPID PANEL
Cholesterol: 203 mg/dL — ABNORMAL HIGH (ref 0–200)
HDL: 62.3 mg/dL (ref >39.00)
LDL Cholesterol: 125 mg/dL — ABNORMAL HIGH (ref 0–99)
NonHDL: 141.1
Total CHOL/HDL Ratio: 3
Triglycerides: 81 mg/dL (ref 0.0–149.0)
VLDL: 16.2 mg/dL (ref 0.0–40.0)

## 2023-12-13 LAB — IBC + FERRITIN
Ferritin: 67.4 ng/mL (ref 22.0–322.0)
Iron: 82 ug/dL (ref 42–165)
Saturation Ratios: 22.8 % (ref 20.0–50.0)
TIBC: 359.8 ug/dL (ref 250.0–450.0)
Transferrin: 257 mg/dL (ref 212.0–360.0)

## 2023-12-13 LAB — URIC ACID: Uric Acid, Serum: 6.5 mg/dL (ref 4.0–7.8)

## 2023-12-13 MED ORDER — METHYLPREDNISOLONE 4 MG PO TBPK: ORAL_TABLET | ORAL | 0 refills | Status: AC

## 2023-12-13 MED ORDER — TIZANIDINE HCL 4 MG PO TABS: ORAL_TABLET | ORAL | 0 refills | Status: AC

## 2023-12-13 NOTE — Telephone Encounter (Signed)
  CRITICAL VALUE: D Dimer 1.18  RECEIVER (on-site recipient of call):Mirka Barbone LPN  DATE & TIME NOTIFIED: 12:53 PM 12/13/23   MESSENGER (representative from lab):Camie RN with E2C2  MD NOTIFIED: Ginger Patrick, NP  TIME OF NOTIFICATION:12:53 PM  RESPONSE:

## 2023-12-13 NOTE — Telephone Encounter (Signed)
 Copied from CRM #[id number]. Topic: Clinical - Lab/Test Results >> Dec 13, 2023 12:42 PM [place] J wrote: Reason for CRM: Claris calling from [facility] with critical lab results.  Shay with [facility] calling to report critical result: D-Dimer of 1.18. Called CAL at [facility] to report result. Lab result visible in Epic.

## 2023-12-13 NOTE — Telephone Encounter (Signed)
 I was not verbally notified when critical was called in.  I noticed elevated D dimer while working my inbox at [employer], called pt immediately.  Pt has been advised to go to the ER for worry DVT vs PE

## 2023-12-13 NOTE — ED Triage Notes (Signed)
 Pt noticed a bruise on the inside of their left knee on Saturday and went to see their PCP this morning. Pt was sent to get an US  of the leg when they received a call to go to the ER due to blood work showing concern for a blood clot. No prior Hx of blood clots. Pt endorses a change in their breathing that started this afternoon on the right side of their chest. Pt is A&Ox4 and ambulatory during triage.

## 2023-12-13 NOTE — Discharge Instructions (Signed)
 Wear your knee brace, rest, ice, and elevate when painful and swollen.  Follow-up with orthopedics if symptoms become recurrent.  Take ibuprofen  or Naprosyn  when needed for pain.  Return to the emergency department for symptoms of change or worsen if you are unable to schedule an appointment with primary care or the orthopedist.

## 2023-12-13 NOTE — Patient Instructions (Signed)
 I have sent in your order electronically for the following: ultrasound left lower extremity  at this location below. Please call to schedule the appointment at your convenience  [facility] outpatient imaging center off [address], [place] [address] Phone [phone]-  8-5 pm    ------------------------------------

## 2023-12-13 NOTE — ED Provider Notes (Signed)
   [facility] Provider Note    Event Date/Time   First MD Initiated Contact with Patient 12/13/23 1558     (approximate)   History   blood clot   HPI  Marcus Andrews is a 63 y.o. male with history of of diverticular abscess, diverticulosis, and as listed in EMR presents to the emergency department for treatment and evaluation of bruising on the inside of the left knee without known injury.  Primary care ordered labs including D-dimer which was elevated and therefore sent him to the emergency department for ultrasound.  He denies chest pain or shortness of breath.  No previous history of DVT.  No recent immobilization or long travel time.  No hormone therapy.     Physical Exam    Vitals:   12/13/23 1516 12/13/23 1801  BP: (!) 148/91 (!) 141/86  Pulse: 64 69  Resp: 16 16  Temp: 98.7 F (37.1 C)   SpO2: 97% 98%    General: Awake, no distress.  CV:  Good peripheral perfusion.  Resp:  Normal effort.  No pleuritic pain with deep inspiration.  Breath sounds clear to auscultation Abd:  No distention.  Other:  Ecchymosis noted to the medial aspect of the left knee.   ED Results / Procedures / Treatments   Labs (all labs ordered are listed, but only abnormal results are displayed)  Labs Reviewed - No data to display   EKG  Not indicated   RADIOLOGY  Image and radiology report reviewed and interpreted by me. Radiology report consistent with the same.  Ultrasound of the left lower extremity negative for DVT but positive for Baker's cyst.  PROCEDURES:  Critical Care performed: No  Procedures   MEDICATIONS ORDERED IN ED:  Medications - No data to display   IMPRESSION / MDM / ASSESSMENT AND PLAN / ED COURSE   I have reviewed the triage note and vital signs. Vital signs stable.   Differential diagnosis includes, but is not limited to, DVT, contusion, musculoskeletal pain.  Patient's presentation is most consistent with acute  presentation with potential threat to life or bodily function.  63 year old male presenting to the emergency department for ultrasound after D-dimer was elevated today.  See HPI for further details.  Triage note reviewed and indicates some concern for change in breathing on the right side however the patient states that he has not had any breathing difficulty and does not have any shortness of breath or chest pain.  Ultrasound shows a Baker's cyst.  Results reviewed with the patient who feels reassured.  He is to wear a knee brace for support and take Naprosyn , ibuprofen , or Tylenol . Outpatient follow up and ER return precautions discussed.     FINAL CLINICAL IMPRESSION(S) / ED DIAGNOSES   Final diagnoses:  Baker's cyst of knee, left     Rx / DC Orders   ED Discharge Orders     None        Note:  This document was prepared using Dragon voice recognition software and may include unintentional dictation errors.   Herlinda Kirk NOVAK, FNP 12/13/23 2321    Arlander Charleston, MD 12/13/23 2330

## 2023-12-13 NOTE — Progress Notes (Unsigned)
 y  Established Patient Office Visit  Subjective:      CC:  Chief Complaint  Patient presents with   Acute Visit    Pt has multiple concerns >> hip pain, low energy, having issues sleeping, and possible blood clot at his left knee.    HPI: Marcus Andrews is a 63 y.o. male presenting on 12/13/2023 for Acute Visit (Pt has multiple concerns >> hip pain, low energy, having issues sleeping, and possible blood clot at his left knee.) .  Discussed the use of AI scribe software for clinical note transcription with the patient, who gave verbal consent to proceed.  History of Present Illness Marcus Andrews is a 63 year old male who presents with concerns of a possible blood clot in the right inner knee.  Symptoms began late Saturday night with itchiness in the throat and two veins becoming enlarged with redness around them, localized to the right inner knee. He has difficulty straightening the leg due to a pre-existing knee condition. He has not taken any medication for this issue except for two baby aspirin  at night.  He has a history of knee problems, specifically 'bone on bone' in the knee, and has received cortisone and gel injections in the past. These treatments have been effective for his right knee but not for his left. He has not been taking daily aspirin  as previously recommended due to a pause after surgery and has not resumed it.  He experiences hip pain, which has been gradually worsening over the past two months. The pain is described as excruciating and burning, particularly after standing for about five minutes, and radiates down the right leg.  He has a history of shortness of breath, which he attributes to possible allergies or environmental factors, such as changing linens. He used his inhaler last night due to a sensation in his lungs, although he was not short of breath. No recent episodes of gout or diabetes.  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 He has insurance and is considering future knee replacement surgery once he has disability benefits. He has a history of snoring and variable sleep quality, with broken sleep patterns. He has not completed a previously recommended sleep study.         Social history:  Relevant past medical, surgical, family and social history reviewed and updated as indicated. Interim medical history since our last visit reviewed.  Allergies and medications reviewed and updated.  DATA REVIEWED: CHART IN EPIC     ROS: Negative unless specifically indicated above in HPI.    Current Outpatient Medications:    acetaminophen  (TYLENOL ) 325 MG tablet, Take 2 tablets (650 mg total) by mouth every 6 (six) hours as needed for mild pain, fever or headache., Disp: , Rfl:    aspirin  EC 81 MG tablet, Take 1 tablet (81 mg total) by mouth daily. Swallow whole., Disp: , Rfl:    ibuprofen  (ADVIL ) 200 MG tablet, Take 200-600 mg by mouth as needed., Disp: , Rfl:    methylPREDNISolone  (MEDROL  DOSEPAK) 4 MG TBPK tablet, Take per package instructions, Disp: 21 tablet, Rfl: 0   pravastatin  (PRAVACHOL ) 20 MG tablet, Take 1 tablet (20 mg total) by mouth every evening., Disp: 90 tablet, Rfl: 3   tiZANidine (ZANAFLEX) 4 MG tablet, 1/2 to one tablet po at bedtime prn muscle spasm, Disp: 30 tablet, Rfl: 0   traMADol  (  ULTRAM ) 50 MG tablet, Take 1 tablet (50 mg total) by mouth every 12 (twelve) hours as needed., Disp: , Rfl:         Objective:        BP 126/86 (BP Location: Left Arm, Patient Position: Sitting, Cuff Size: Large)   Pulse 84   Temp 98.1 F (36.7 C) (Temporal)   Ht 6' (1.829 m)   Wt 195 lb 3.2 oz (88.5 kg)   SpO2 98%   BMI 26.47 kg/m   Physical Exam MUSCULOSKELETAL: Right hip joint tender on palpation. Spinal tenderness on palpation. Sciatic nerve tenderness on palpation. Right knee swollen, warm, and tender on palpation.  Wt  Readings from Last 3 Encounters:  12/13/23 195 lb 3.2 oz (88.5 kg)  11/10/23 190 lb 9.6 oz (86.5 kg)  09/23/23 191 lb (86.6 kg)    Physical Exam Vitals reviewed.  Cardiovascular:     Rate and Rhythm: Normal rate and regular rhythm.  Pulmonary:     Breath sounds: Normal breath sounds. No decreased air movement.  Musculoskeletal:     Lumbar back: Spasms (left lateral hip), tenderness and bony tenderness present. Decreased range of motion (pain with external rotation right side and with flexion). Positive left straight leg raise test.     Left knee: Swelling, effusion and erythema present. Normal range of motion. Tenderness present over the lateral joint line and patellar tendon.     Comments: Non tender bakers cyst left posterior knee            Results   Assessment & Plan:   Assessment and Plan Assessment & Plan Rule out right lower extremity deep vein thrombosis (DVT) Possible DVT in the right inner knee, presenting with redness, warmth, and tenderness since late Saturday night. Differential includes DVT versus other causes of inflammation or infection. - Order ultrasound of the right lower extremity to rule out DVT - Advise to resume daily aspirin  regimen - Instruct to avoid ibuprofen  and other NSAIDs while on aspirin  - Advise to monitor for sudden shortness of breath or chest pain and call 911 if these occur  Right knee osteoarthritis Chronic right knee osteoarthritis with swelling and warmth. Previous cortisone and gel injections have been ineffective for the left knee but helpful for the right knee. - Recommend Tylenol  for pain management - Suggest Voltaren gel for topical relief - Advise to apply ice or heat depending on comfort, especially after ruling out DVT  Right-sided sciatica Gradual onset of right-sided sciatica with burning pain radiating down the leg, exacerbated by certain movements. No significant weakness reported. - Prescribe tizanidine 4 mg, instruct  to take half a tablet initially, preferably at night - Provide sciatica stretching exercises - Recommend applying heat to the affected area  Right hip pain, likely osteoarthritis Gradual onset of right hip pain over the past two months, with burning sensation and pain radiating down the leg. Likely osteoarthritis with possible contribution from sciatica. - Consider Medrol  DosePak pending ultrasound results - Recommend heat application and stretching exercises  Hyperlipidemia Hyperlipidemia, currently not on aspirin  regimen due to previous surgery. Due for cholesterol re-evaluation. - Order blood test for cholesterol levels  Obstructive sleep apnea, suspected Suspected obstructive sleep apnea with symptoms of snoring and intermittent daytime fatigue. Previous sleep study referral was not completed. - Reinitiate referral for sleep study  Recording duration: 19 minutes      Return in about 6 months (around 06/11/2024) for f/u CPE.     Ginger Patrick, MSN, APRN, FNP-C  [facility]

## 2023-12-14 ENCOUNTER — Encounter: Payer: Self-pay | Admitting: Family

## 2023-12-24 ENCOUNTER — Encounter: Payer: Self-pay | Admitting: *Deleted

## 2023-12-24 ENCOUNTER — Ambulatory Visit: Admitting: Family

## 2023-12-27 MED ORDER — PRAVASTATIN SODIUM 40 MG PO TABS: 40.0000 mg | ORAL_TABLET | Freq: Every day | ORAL | 3 refills | Status: AC

## 2024-01-03 DIAGNOSIS — Z0189 Encounter for other specified special examinations: Secondary | ICD-10-CM | POA: Diagnosis not present

## 2024-01-03 DIAGNOSIS — M1712 Unilateral primary osteoarthritis, left knee: Secondary | ICD-10-CM | POA: Diagnosis not present

## 2024-01-03 DIAGNOSIS — M170 Bilateral primary osteoarthritis of knee: Secondary | ICD-10-CM | POA: Diagnosis not present

## 2024-01-04 DIAGNOSIS — M170 Bilateral primary osteoarthritis of knee: Secondary | ICD-10-CM | POA: Diagnosis not present

## 2024-01-04 DIAGNOSIS — Z79891 Long term (current) use of opiate analgesic: Secondary | ICD-10-CM | POA: Diagnosis not present

## 2024-01-04 IMAGING — US US SCROTUM W/ DOPPLER COMPLETE
1 series · 14 of 25 positions shown · non-contrast
Comparison: None.

CLINICAL DATA: Bilateral inguinal and scrotal discomfort right
greater than left

EXAM:
SCROTAL ULTRASOUND
DOPPLER ULTRASOUND OF THE TESTICLES
TECHNIQUE: Complete ultrasound examination of the testicles, epididymis, and
other scrotal structures was performed. Color and spectral Doppler
ultrasound were also utilized to evaluate blood flow to the
testicles.

[Series 1: us scrotum w/ doppler complete · 0.07mm/px · 14 of 42 slices shown]
[im 1/42]
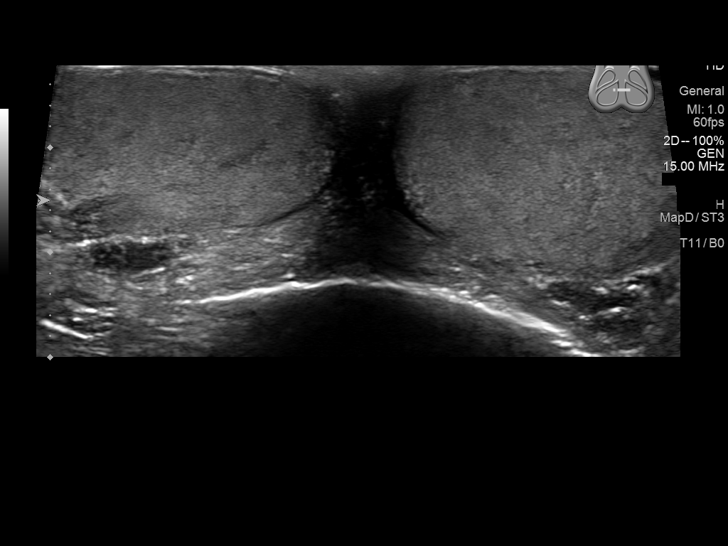
[im 4/42]
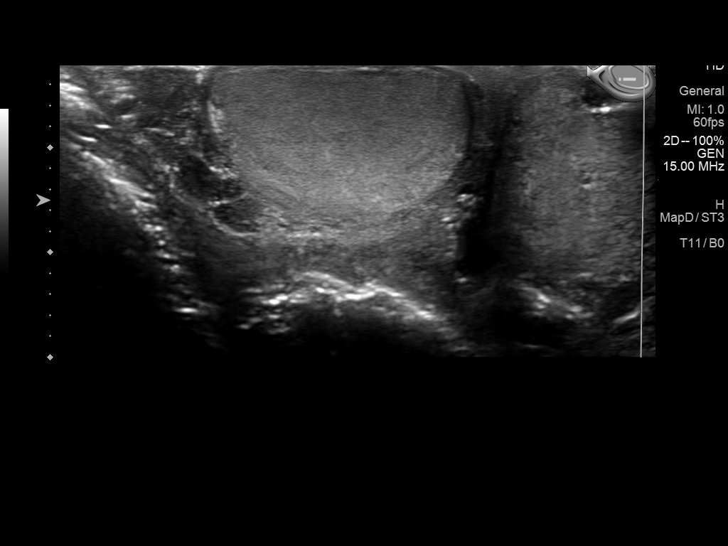
[im 7/42]
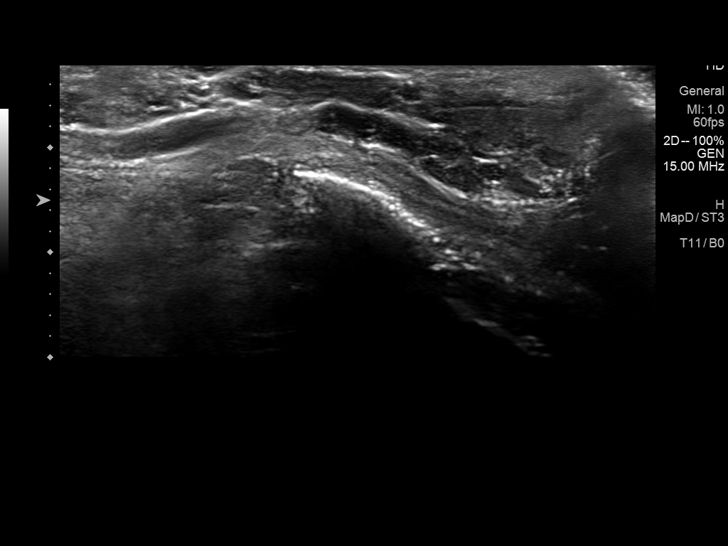
[im 11/42]
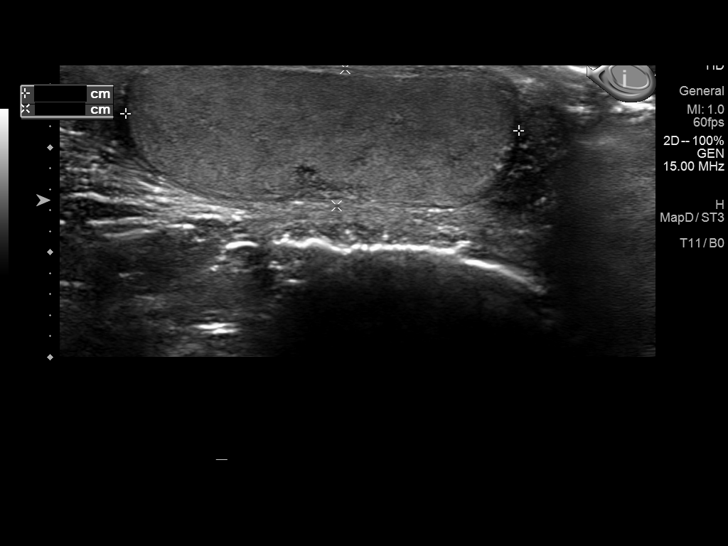
[im 14/42]
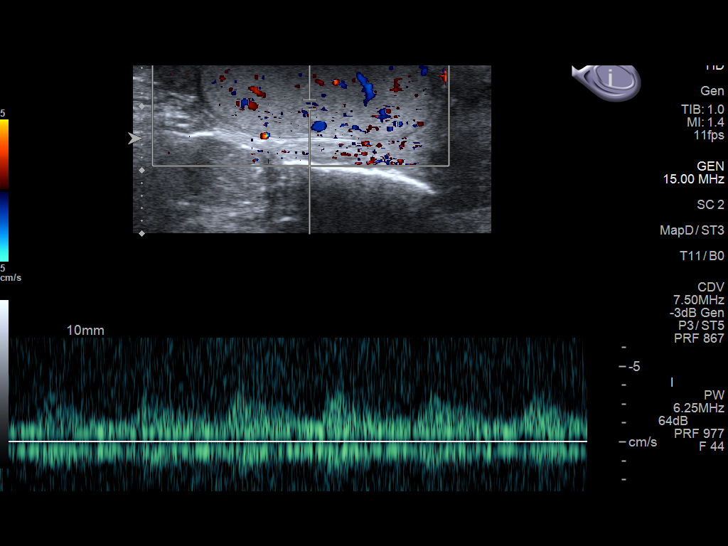
[im 16/42]
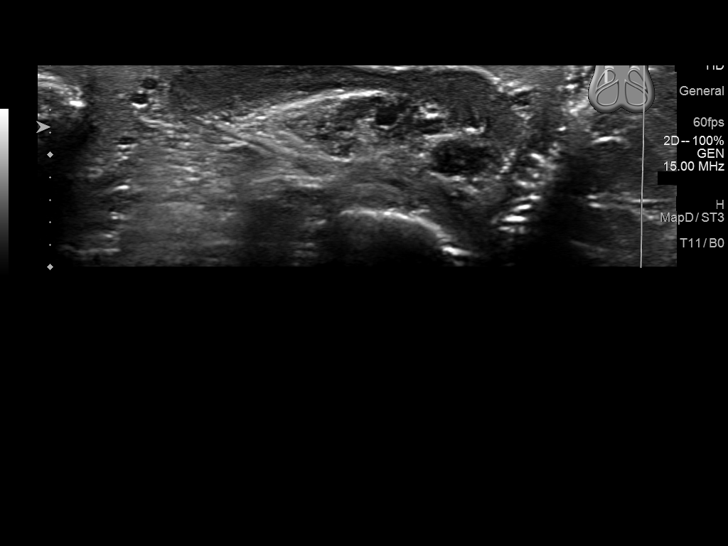
[im 19/42]
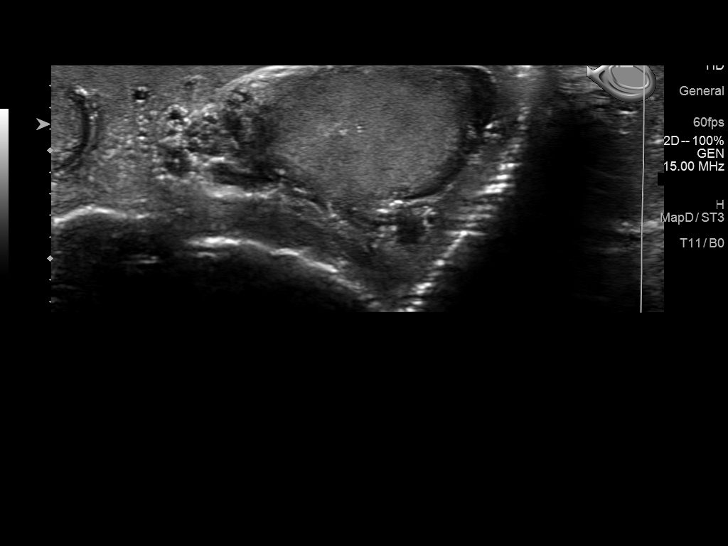
[im 23/42]
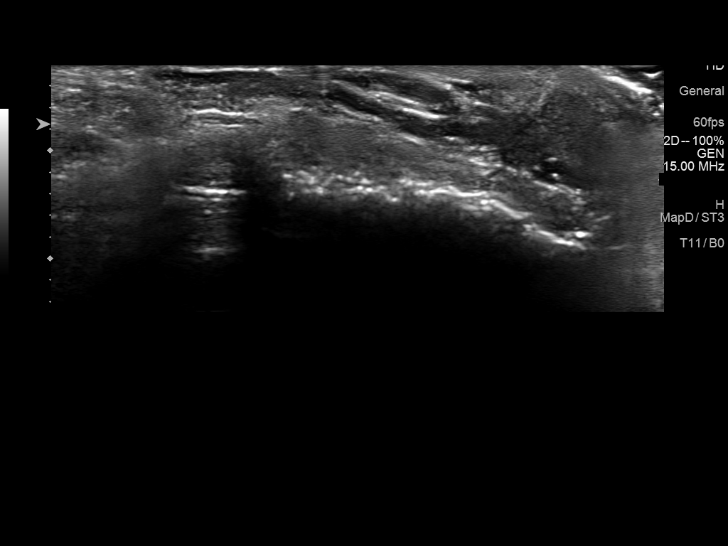
[im 26/42]
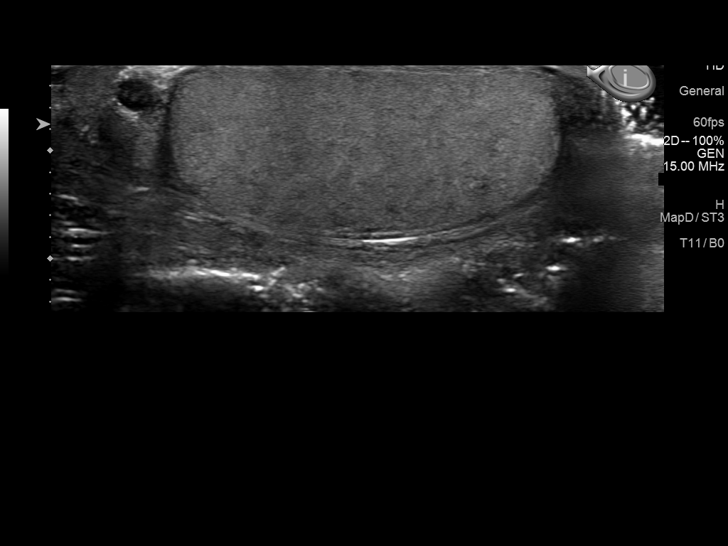
[im 28/42]
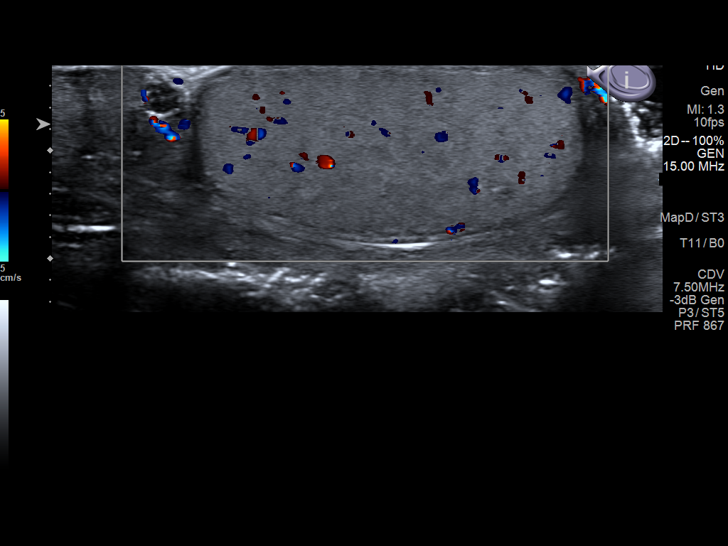
[im 31/42]
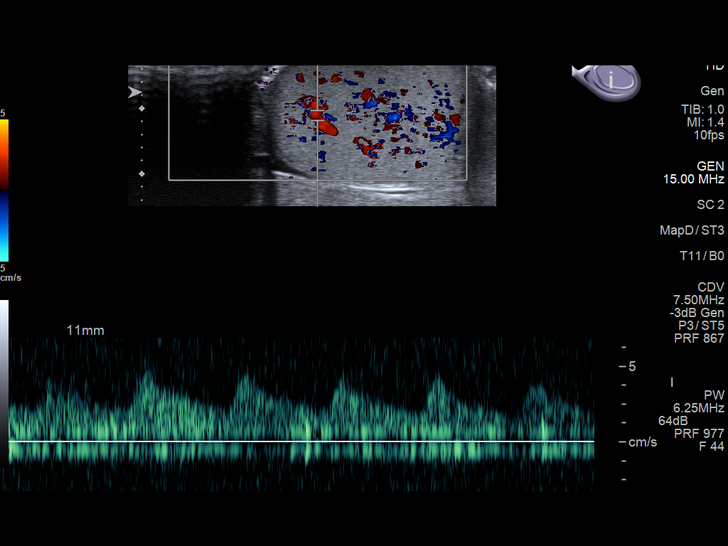
[im 35/42]
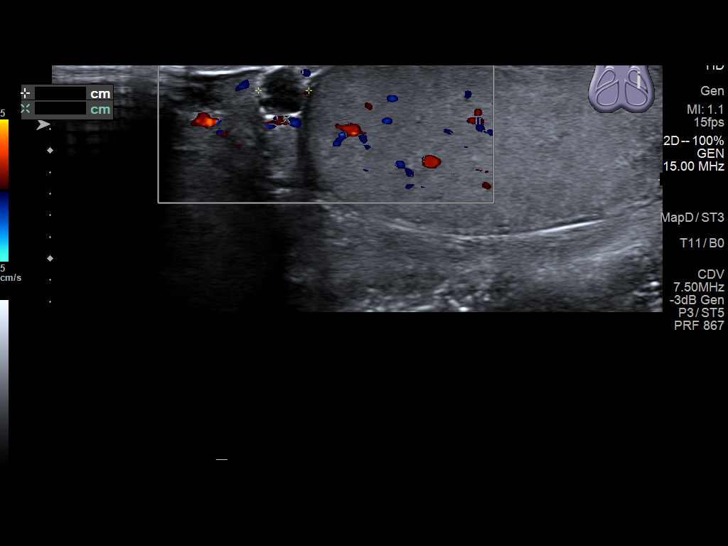
[im 38/42]
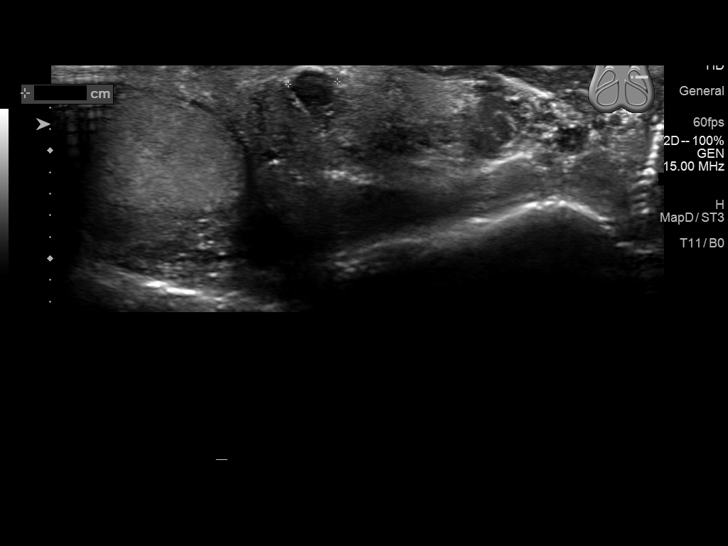
[im 42/42]
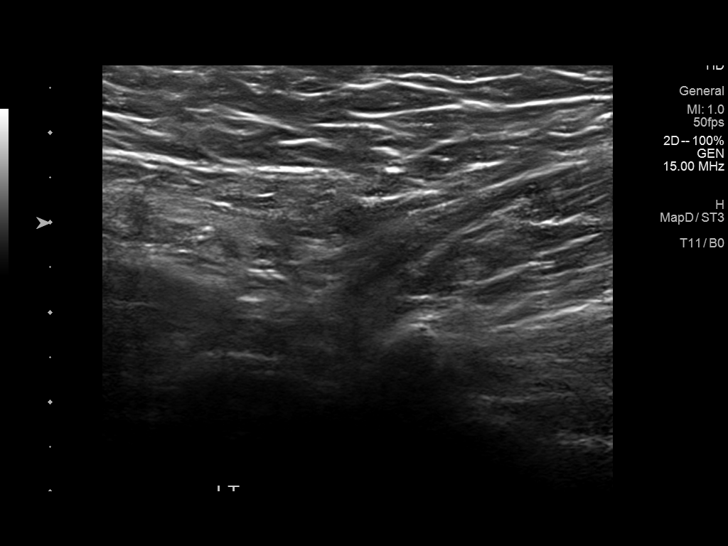

[14 of 25 positions shown; findings below may reference images not displayed]

FINDINGS: Right testicle

Measurements: 2.5 x 3.8 x 1.3 cm. No mass or microlithiasis
visualized.

Left testicle

Measurements: 2.6 x 3.7 x 1.6 cm. No mass or microlithiasis
visualized.

Right epididymis:  Normal in size and appearance.

Left epididymis: 5 mm left epididymal cyst. Otherwise the left
epididymis is unremarkable.

Hydrocele:  None visualized.

Varicocele:  None visualized.

Pulsed Doppler interrogation of both testes demonstrates normal low
resistance arterial and venous waveforms bilaterally.

Other: No evidence of inguinal hernia.
IMPRESSION: 1. Incidental 5 mm left epididymal cyst, of doubtful clinical
significance.
2. Otherwise unremarkable exam.

## 2024-01-11 ENCOUNTER — Telehealth: Payer: Self-pay | Admitting: Family

## 2024-01-11 NOTE — Telephone Encounter (Signed)
 Copied from CRM [id number]. Topic: Appointments - Scheduling Inquiry for Clinic >> Jan 11, 2024 12:46 PM Ashley R wrote: Reason for CRM: Lab orders in hand for blood work pre-surgery. Please callback for scheduling   [phone]

## 2024-02-09 DIAGNOSIS — M25562 Pain in left knee: Secondary | ICD-10-CM | POA: Diagnosis not present

## 2024-02-09 DIAGNOSIS — G8918 Other acute postprocedural pain: Secondary | ICD-10-CM | POA: Diagnosis not present

## 2024-02-09 DIAGNOSIS — M1712 Unilateral primary osteoarthritis, left knee: Secondary | ICD-10-CM | POA: Diagnosis not present

## 2024-02-09 DIAGNOSIS — R262 Difficulty in walking, not elsewhere classified: Secondary | ICD-10-CM | POA: Diagnosis not present

## 2024-02-12 DIAGNOSIS — Z87891 Personal history of nicotine dependence: Secondary | ICD-10-CM | POA: Diagnosis not present

## 2024-02-12 DIAGNOSIS — E7800 Pure hypercholesterolemia, unspecified: Secondary | ICD-10-CM | POA: Diagnosis not present

## 2024-02-12 DIAGNOSIS — Z96652 Presence of left artificial knee joint: Secondary | ICD-10-CM | POA: Diagnosis not present

## 2024-02-12 DIAGNOSIS — Z471 Aftercare following joint replacement surgery: Secondary | ICD-10-CM | POA: Diagnosis not present

## 2024-02-12 DIAGNOSIS — M1711 Unilateral primary osteoarthritis, right knee: Secondary | ICD-10-CM | POA: Diagnosis not present

## 2024-02-12 DIAGNOSIS — Z7982 Long term (current) use of aspirin: Secondary | ICD-10-CM | POA: Diagnosis not present

## 2024-02-12 DIAGNOSIS — Z79891 Long term (current) use of opiate analgesic: Secondary | ICD-10-CM | POA: Diagnosis not present

## 2024-02-15 DIAGNOSIS — Z7982 Long term (current) use of aspirin: Secondary | ICD-10-CM | POA: Diagnosis not present

## 2024-02-15 DIAGNOSIS — E7800 Pure hypercholesterolemia, unspecified: Secondary | ICD-10-CM | POA: Diagnosis not present

## 2024-02-15 DIAGNOSIS — Z87891 Personal history of nicotine dependence: Secondary | ICD-10-CM | POA: Diagnosis not present

## 2024-02-15 DIAGNOSIS — Z96652 Presence of left artificial knee joint: Secondary | ICD-10-CM | POA: Diagnosis not present

## 2024-02-15 DIAGNOSIS — Z79891 Long term (current) use of opiate analgesic: Secondary | ICD-10-CM | POA: Diagnosis not present

## 2024-02-15 DIAGNOSIS — M1711 Unilateral primary osteoarthritis, right knee: Secondary | ICD-10-CM | POA: Diagnosis not present

## 2024-02-15 DIAGNOSIS — Z471 Aftercare following joint replacement surgery: Secondary | ICD-10-CM | POA: Diagnosis not present

## 2024-02-17 DIAGNOSIS — Z471 Aftercare following joint replacement surgery: Secondary | ICD-10-CM | POA: Diagnosis not present

## 2024-02-17 DIAGNOSIS — Z87891 Personal history of nicotine dependence: Secondary | ICD-10-CM | POA: Diagnosis not present

## 2024-02-17 DIAGNOSIS — M1711 Unilateral primary osteoarthritis, right knee: Secondary | ICD-10-CM | POA: Diagnosis not present

## 2024-02-17 DIAGNOSIS — Z7982 Long term (current) use of aspirin: Secondary | ICD-10-CM | POA: Diagnosis not present

## 2024-02-17 DIAGNOSIS — Z96652 Presence of left artificial knee joint: Secondary | ICD-10-CM | POA: Diagnosis not present

## 2024-02-17 DIAGNOSIS — E7800 Pure hypercholesterolemia, unspecified: Secondary | ICD-10-CM | POA: Diagnosis not present

## 2024-02-17 DIAGNOSIS — Z79891 Long term (current) use of opiate analgesic: Secondary | ICD-10-CM | POA: Diagnosis not present

## 2024-02-21 DIAGNOSIS — Z7982 Long term (current) use of aspirin: Secondary | ICD-10-CM | POA: Diagnosis not present

## 2024-02-21 DIAGNOSIS — Z96652 Presence of left artificial knee joint: Secondary | ICD-10-CM | POA: Diagnosis not present

## 2024-02-21 DIAGNOSIS — Z79891 Long term (current) use of opiate analgesic: Secondary | ICD-10-CM | POA: Diagnosis not present

## 2024-02-21 DIAGNOSIS — E7800 Pure hypercholesterolemia, unspecified: Secondary | ICD-10-CM | POA: Diagnosis not present

## 2024-02-21 DIAGNOSIS — Z471 Aftercare following joint replacement surgery: Secondary | ICD-10-CM | POA: Diagnosis not present

## 2024-02-21 DIAGNOSIS — M1711 Unilateral primary osteoarthritis, right knee: Secondary | ICD-10-CM | POA: Diagnosis not present

## 2024-02-21 DIAGNOSIS — Z87891 Personal history of nicotine dependence: Secondary | ICD-10-CM | POA: Diagnosis not present

## 2024-02-24 DIAGNOSIS — Z79891 Long term (current) use of opiate analgesic: Secondary | ICD-10-CM | POA: Diagnosis not present

## 2024-02-24 DIAGNOSIS — E7800 Pure hypercholesterolemia, unspecified: Secondary | ICD-10-CM | POA: Diagnosis not present

## 2024-02-24 DIAGNOSIS — Z96652 Presence of left artificial knee joint: Secondary | ICD-10-CM | POA: Diagnosis not present

## 2024-02-24 DIAGNOSIS — Z7982 Long term (current) use of aspirin: Secondary | ICD-10-CM | POA: Diagnosis not present

## 2024-02-24 DIAGNOSIS — Z87891 Personal history of nicotine dependence: Secondary | ICD-10-CM | POA: Diagnosis not present

## 2024-02-24 DIAGNOSIS — M1711 Unilateral primary osteoarthritis, right knee: Secondary | ICD-10-CM | POA: Diagnosis not present

## 2024-02-24 DIAGNOSIS — Z471 Aftercare following joint replacement surgery: Secondary | ICD-10-CM | POA: Diagnosis not present

## 2024-02-28 DIAGNOSIS — Z96652 Presence of left artificial knee joint: Secondary | ICD-10-CM | POA: Diagnosis not present

## 2024-02-28 DIAGNOSIS — Z471 Aftercare following joint replacement surgery: Secondary | ICD-10-CM | POA: Diagnosis not present

## 2024-02-28 DIAGNOSIS — Z87891 Personal history of nicotine dependence: Secondary | ICD-10-CM | POA: Diagnosis not present

## 2024-02-28 DIAGNOSIS — Z96651 Presence of right artificial knee joint: Secondary | ICD-10-CM | POA: Diagnosis not present

## 2024-02-28 DIAGNOSIS — M1711 Unilateral primary osteoarthritis, right knee: Secondary | ICD-10-CM | POA: Diagnosis not present

## 2024-02-28 DIAGNOSIS — Z79891 Long term (current) use of opiate analgesic: Secondary | ICD-10-CM | POA: Diagnosis not present

## 2024-02-28 DIAGNOSIS — E7800 Pure hypercholesterolemia, unspecified: Secondary | ICD-10-CM | POA: Diagnosis not present

## 2024-02-28 DIAGNOSIS — Z7982 Long term (current) use of aspirin: Secondary | ICD-10-CM | POA: Diagnosis not present

## 2024-03-06 DIAGNOSIS — Z96652 Presence of left artificial knee joint: Secondary | ICD-10-CM | POA: Diagnosis not present

## 2024-04-12 ENCOUNTER — Ambulatory Visit: Admitting: Family

## 2024-04-14 ENCOUNTER — Inpatient Hospital Stay: Admission: RE | Admit: 2024-04-14 | Source: Ambulatory Visit

## 2024-04-17 ENCOUNTER — Ambulatory Visit: Admitting: Family

## 2024-04-25 ENCOUNTER — Ambulatory Visit: Admitting: Family
